# Patient Record
Sex: Male | Born: 1965 | Race: White | Hispanic: No | Marital: Single | Smoking: Never smoker
Health system: Southern US, Community
[De-identification: ages and names within clinical notes are randomized; demographics above are authoritative.]

## PROBLEM LIST (undated history)

## (undated) DIAGNOSIS — F329 Major depressive disorder, single episode, unspecified: Secondary | ICD-10-CM

## (undated) DIAGNOSIS — E559 Vitamin D deficiency, unspecified: Secondary | ICD-10-CM

## (undated) DIAGNOSIS — K439 Ventral hernia without obstruction or gangrene: Secondary | ICD-10-CM

## (undated) DIAGNOSIS — G4730 Sleep apnea, unspecified: Secondary | ICD-10-CM

## (undated) DIAGNOSIS — G8929 Other chronic pain: Secondary | ICD-10-CM

## (undated) DIAGNOSIS — B019 Varicella without complication: Secondary | ICD-10-CM

## (undated) DIAGNOSIS — F32A Depression, unspecified: Secondary | ICD-10-CM

## (undated) DIAGNOSIS — I10 Essential (primary) hypertension: Secondary | ICD-10-CM

## (undated) DIAGNOSIS — Z8614 Personal history of Methicillin resistant Staphylococcus aureus infection: Secondary | ICD-10-CM

## (undated) HISTORY — DX: Depression, unspecified: F32.A

## (undated) HISTORY — DX: Sleep apnea, unspecified: G47.30

## (undated) HISTORY — DX: Ventral hernia without obstruction or gangrene: K43.9

## (undated) HISTORY — DX: Varicella without complication: B01.9

## (undated) HISTORY — PX: ACHILLES TENDON REPAIR: SUR1153

## (undated) HISTORY — DX: Vitamin D deficiency, unspecified: E55.9

## (undated) HISTORY — DX: Major depressive disorder, single episode, unspecified: F32.9

## (undated) HISTORY — PX: UVULOPALATOPHARYNGOPLASTY: SHX827

## (undated) HISTORY — DX: Personal history of Methicillin resistant Staphylococcus aureus infection: Z86.14

---

## 2018-05-29 ENCOUNTER — Ambulatory Visit: Payer: Self-pay | Admitting: Family Medicine

## 2018-06-02 ENCOUNTER — Encounter: Payer: Self-pay | Admitting: Family Medicine

## 2018-06-02 ENCOUNTER — Ambulatory Visit: Payer: BLUE CROSS/BLUE SHIELD | Admitting: Family Medicine

## 2018-06-02 VITALS — BP 150/80 | HR 88 | Temp 97.9°F | Ht 68.0 in | Wt 245.5 lb

## 2018-06-02 DIAGNOSIS — R5383 Other fatigue: Secondary | ICD-10-CM

## 2018-06-02 DIAGNOSIS — G8929 Other chronic pain: Secondary | ICD-10-CM

## 2018-06-02 DIAGNOSIS — Z1211 Encounter for screening for malignant neoplasm of colon: Secondary | ICD-10-CM

## 2018-06-02 DIAGNOSIS — F321 Major depressive disorder, single episode, moderate: Secondary | ICD-10-CM

## 2018-06-02 DIAGNOSIS — E559 Vitamin D deficiency, unspecified: Secondary | ICD-10-CM | POA: Insufficient documentation

## 2018-06-02 DIAGNOSIS — M549 Dorsalgia, unspecified: Secondary | ICD-10-CM | POA: Diagnosis not present

## 2018-06-02 DIAGNOSIS — F339 Major depressive disorder, recurrent, unspecified: Secondary | ICD-10-CM | POA: Insufficient documentation

## 2018-06-02 DIAGNOSIS — R030 Elevated blood-pressure reading, without diagnosis of hypertension: Secondary | ICD-10-CM

## 2018-06-02 DIAGNOSIS — Z13220 Encounter for screening for lipoid disorders: Secondary | ICD-10-CM

## 2018-06-02 DIAGNOSIS — Z23 Encounter for immunization: Secondary | ICD-10-CM | POA: Diagnosis not present

## 2018-06-02 MED ORDER — BUPROPION HCL ER (XL) 150 MG PO TB24: 150.0000 mg | ORAL_TABLET | Freq: Every day | ORAL | 2 refills | Status: DC

## 2018-06-02 MED ORDER — MELOXICAM 7.5 MG PO TABS: 7.5000 mg | ORAL_TABLET | Freq: Every day | ORAL | 0 refills | Status: DC

## 2018-06-02 NOTE — Progress Notes (Signed)
Adam Mcdonald DOB: [birth year] Encounter date: 06/02/2018  This is a 52 y.o. male who presents to establish care. Chief Complaint  Patient presents with  . New Patient (Initial Visit)    pt states he has depression associated with the move, C-pap machine does not seal with his beard and he falls asleep during the day    History of present illness: Just moved from [place]. Has had a hard time with this. 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 Responsibilities doubled coming here. Likes people he is working with, but high stress.   Suffered from depression since time in [employer] 1997 was hospitalized due to depression x 5 days. Was seeing psychiatry but had hard time finding medication that worked for him (trazodone, prozac, paxil) due to ineffectiveness or side effects. At that point was on risperdal as well for some paranoia. Suffered from sleep disorder as well. Remeron helped with this but caused huge weight gain. Then started with wellbutrin which he liked. Has been pretty stable. Went to day treatment by choice and this helped him become more aware of warning signs for worsening mood. Hasn't done therapy for 5 years.   Likes to lift weights; but hasn't been going for a couple of months which makes him frustrated with self. Hasn't been motivated to do this at home or at gym (he has membership).   Has sleep apnea; mask not sealing well with beard but he really doesn't want to shave. Has sealed ok in the past with trimmed beard. Hasn't looked at report in a couple of months because afraid of score. Saw resp specialist in past who increased pressure to overcome leakage.   Has abdominal hernia. Has constipation quite often. Not having pain with hernia, just bothers him cosmetically. Would like this checked today.   Anxiety hasn't been much of an issue for him.   BP has been creeping up the last couple of years.  Takes aleve for back pain - holds stress there. Notes when he  doesn't take this.   Past Medical History:  Diagnosis Date  . Chicken pox   . Depression   . History of MRSA infection   . Sleep apnea   . Ventral hernia without obstruction or gangrene   . Vitamin D deficiency     The histories are not reviewed yet. Please review them in the "History" navigator section and refresh this [place]. Allergies  Allergen Reactions  . Other     Thimerosal (eyes only)   Current Meds  Medication Sig  . buPROPion (WELLBUTRIN XL) 300 MG 24 hr tablet Take 300 mg by mouth daily.  . carboxymethylcellulose (REFRESH PLUS) 0.5 % SOLN 1 drop 3 (three) times daily as needed.  . Cholecalciferol (VITAMIN D3) 1.25 MG (50000 UT) CAPS Take by mouth.  [name] CREATINE PO Take 5 g by mouth.  [name] MAGNESIUM GLYCINATE PLUS PO Take 120 mg by mouth 2 (two) times daily.  . Multiple Vitamin (MULTI-VITAMIN DAILY PO) Take by mouth.  . Omega 3 1200 MG CAPS Take by mouth.  . psyllium (METAMUCIL) 58.6 % powder Take 1 packet by mouth 3 (three) times daily.  . sodium chloride (MURO 128) 5 % ophthalmic solution 1 drop as needed for eye irritation.   Social History   Tobacco Use  . Smoking status: Never Smoker  . Smokeless tobacco: Never Used  Substance Use Topics  . Alcohol use: Yes    Comment: occasional   Family History  Problem Relation Age  of Onset  . Diabetes Mother   . Depression Mother   . High blood pressure Father   . Stroke Maternal Grandfather   . Heart attack Paternal Grandmother 45  . Heart attack Paternal Grandfather 50     Review of Systems  Constitutional: Negative for chills, fatigue and fever.  Respiratory: Negative for cough, chest tightness, shortness of breath and wheezing.   Cardiovascular: Negative for chest pain, palpitations and leg swelling.  Gastrointestinal: Positive for constipation (metamucil helps). Negative for abdominal pain.  Psychiatric/Behavioral: Positive for sleep disturbance. The patient is not nervous/anxious.        See hpi      Objective:  BP (!) 150/80 (BP Location: Left Arm, Patient Position: Sitting, Cuff Size: Large)   Pulse 88   Temp 97.9 F (36.6 C) (Oral)   Ht 5\' 8"  (1.727 m)   Wt 245 lb 8 oz (111.4 kg)   SpO2 97%   BMI 37.33 kg/m   Weight: 245 lb 8 oz (111.4 kg)   BP Readings from Last 3 Encounters:  06/02/18 (!) 150/80   Wt Readings from Last 3 Encounters:  06/02/18 245 lb 8 oz (111.4 kg)    Physical Exam Constitutional:      General: He is not in acute distress.    Appearance: He is well-developed.  Cardiovascular:     Rate and Rhythm: Normal rate and regular rhythm.     Heart sounds: Murmur present. Systolic murmur present with a grade of 2/6. No friction rub.     Comments: No lower extremity edema Pulmonary:     Effort: Pulmonary effort is normal. No respiratory distress.     Breath sounds: Normal breath sounds. No wheezing or rales.  Abdominal:     General: Bowel sounds are normal.     Comments: Diastasis recti apparent with abdominal strain of sitting up.   Neurological:     Mental Status: He is alert and oriented to person, place, and time.  Psychiatric:        Behavior: Behavior normal.     Assessment/Plan:  1. Depression, major, single episode, moderate (HCC) Increase wellbutrin to 450mg  daily. - buPROPion (WELLBUTRIN XL) 150 MG 24 hr tablet; Take 1 tablet (150 mg total) by mouth daily.  Dispense: 30 tablet; Refill: 2  2. Need for immunization against influenza - Flu Vaccine QUAD 36+ mos IM  3. Vitamin D deficiency - VITAMIN D 25 Hydroxy (Vit-D Deficiency, Fractures); Future  4. Chronic back pain, unspecified back location, unspecified back pain laterality - meloxicam (MOBIC) 7.5 MG tablet; Take 1 tablet (7.5 mg total) by mouth daily.  Dispense: 30 tablet; Refill: 0  5. Elevated blood pressure reading Will recheck at follow up visit. - CBC with Differential/Platelet; Future - Comprehensive metabolic panel; Future  6. Lipid screening - Lipid panel;  Future  7. Other fatigue Will check some baseline bloodwork.  - TSH; Future  8. Screening for colon cancer - Cologuard  Return 3-4 weeks for depression follow up.  Micheline Rough, MD

## 2018-06-02 NOTE — Patient Instructions (Signed)
Add on the wellbutrin 150mg  to current 300mg  dose.   Trim beard; let me know if cpap is still not sealing properly.   I will call you with bloodwork results when I get them.

## 2018-06-08 ENCOUNTER — Other Ambulatory Visit: Payer: BLUE CROSS/BLUE SHIELD

## 2018-06-13 ENCOUNTER — Other Ambulatory Visit (INDEPENDENT_AMBULATORY_CARE_PROVIDER_SITE_OTHER): Payer: BLUE CROSS/BLUE SHIELD

## 2018-06-13 DIAGNOSIS — E559 Vitamin D deficiency, unspecified: Secondary | ICD-10-CM

## 2018-06-13 DIAGNOSIS — R5383 Other fatigue: Secondary | ICD-10-CM

## 2018-06-13 DIAGNOSIS — Z13220 Encounter for screening for lipoid disorders: Secondary | ICD-10-CM | POA: Diagnosis not present

## 2018-06-13 DIAGNOSIS — R030 Elevated blood-pressure reading, without diagnosis of hypertension: Secondary | ICD-10-CM | POA: Diagnosis not present

## 2018-06-13 DIAGNOSIS — Z1211 Encounter for screening for malignant neoplasm of colon: Secondary | ICD-10-CM | POA: Diagnosis not present

## 2018-06-13 LAB — CBC WITH DIFFERENTIAL/PLATELET
Basophils Absolute: 0 10*3/uL (ref 0.0–0.1)
Basophils Relative: 0.3 % (ref 0.0–3.0)
Eosinophils Absolute: 0.1 10*3/uL (ref 0.0–0.7)
Eosinophils Relative: 1.6 % (ref 0.0–5.0)
HEMATOCRIT: 50.9 % (ref 39.0–52.0)
Hemoglobin: 17.4 g/dL — ABNORMAL HIGH (ref 13.0–17.0)
LYMPHS ABS: 2.5 10*3/uL (ref 0.7–4.0)
LYMPHS PCT: 39.2 % (ref 12.0–46.0)
MCHC: 34.1 g/dL (ref 30.0–36.0)
MCV: 91.1 fl (ref 78.0–100.0)
Monocytes Absolute: 0.5 10*3/uL (ref 0.1–1.0)
Monocytes Relative: 8.4 % (ref 3.0–12.0)
NEUTROS ABS: 3.2 10*3/uL (ref 1.4–7.7)
NEUTROS PCT: 50.5 % (ref 43.0–77.0)
Platelets: 286 10*3/uL (ref 150.0–400.0)
RBC: 5.59 Mil/uL (ref 4.22–5.81)
RDW: 13.2 % (ref 11.5–15.5)
WBC: 6.4 10*3/uL (ref 4.0–10.5)

## 2018-06-13 LAB — LIPID PANEL
Cholesterol: 163 mg/dL (ref 0–200)
HDL: 36.6 mg/dL — ABNORMAL LOW (ref >39.00)
LDL Cholesterol: 92 mg/dL (ref 0–99)
NonHDL: 125.99
Total CHOL/HDL Ratio: 4
Triglycerides: 168 mg/dL — ABNORMAL HIGH (ref 0.0–149.0)
VLDL: 33.6 mg/dL (ref 0.0–40.0)

## 2018-06-13 LAB — COMPREHENSIVE METABOLIC PANEL
ALT: 60 U/L — ABNORMAL HIGH (ref 0–53)
AST: 27 U/L (ref 0–37)
Albumin: 4.8 g/dL (ref 3.5–5.2)
Alkaline Phosphatase: 55 U/L (ref 39–117)
BUN: 13 mg/dL (ref 6–23)
CALCIUM: 9.7 mg/dL (ref 8.4–10.5)
CO2: 30 mEq/L (ref 19–32)
CREATININE: 1.06 mg/dL (ref 0.40–1.50)
Chloride: 101 mEq/L (ref 96–112)
GFR: 78.42 mL/min (ref >60.00)
Glucose, Bld: 83 mg/dL (ref 70–99)
Potassium: 4.7 mEq/L (ref 3.5–5.1)
Sodium: 137 mEq/L (ref 135–145)
Total Bilirubin: 0.7 mg/dL (ref 0.2–1.2)
Total Protein: 7.4 g/dL (ref 6.0–8.3)

## 2018-06-13 LAB — VITAMIN D 25 HYDROXY (VIT D DEFICIENCY, FRACTURES): VITD: 50.83 ng/mL (ref 30.00–100.00)

## 2018-06-13 LAB — TSH: TSH: 2.21 u[IU]/mL (ref 0.35–4.50)

## 2018-06-20 LAB — COLOGUARD
Cologuard: NEGATIVE
Cologuard: NEGATIVE

## 2018-06-21 ENCOUNTER — Encounter: Payer: Self-pay | Admitting: Family Medicine

## 2018-06-23 ENCOUNTER — Ambulatory Visit: Payer: BLUE CROSS/BLUE SHIELD | Admitting: Family Medicine

## 2018-06-23 ENCOUNTER — Encounter: Payer: Self-pay | Admitting: Family Medicine

## 2018-06-23 VITALS — BP 122/70 | HR 70 | Temp 97.9°F | Wt 241.9 lb

## 2018-06-23 DIAGNOSIS — F321 Major depressive disorder, single episode, moderate: Secondary | ICD-10-CM

## 2018-06-23 DIAGNOSIS — M549 Dorsalgia, unspecified: Secondary | ICD-10-CM

## 2018-06-23 DIAGNOSIS — G8929 Other chronic pain: Secondary | ICD-10-CM

## 2018-06-23 DIAGNOSIS — R748 Abnormal levels of other serum enzymes: Secondary | ICD-10-CM | POA: Diagnosis not present

## 2018-06-23 DIAGNOSIS — M7662 Achilles tendinitis, left leg: Secondary | ICD-10-CM

## 2018-06-23 DIAGNOSIS — M545 Low back pain, unspecified: Secondary | ICD-10-CM | POA: Insufficient documentation

## 2018-06-23 MED ORDER — BUPROPION HCL ER (XL) 150 MG PO TB24: 150.0000 mg | ORAL_TABLET | Freq: Every day | ORAL | 1 refills | Status: DC

## 2018-06-23 MED ORDER — MELOXICAM 7.5 MG PO TABS: 7.5000 mg | ORAL_TABLET | Freq: Every day | ORAL | 1 refills | Status: DC

## 2018-06-23 MED ORDER — BUPROPION HCL ER (XL) 300 MG PO TB24: 300.0000 mg | ORAL_TABLET | Freq: Every day | ORAL | 1 refills | Status: DC

## 2018-06-23 NOTE — Progress Notes (Signed)
Adam Mcdonald DOB: 1966/03/02 Encounter date: 06/23/2018  This is a 53 y.o. male who presents with Chief Complaint  Patient presents with  . Follow-up    History of present illness: At last visit 06/02/18: Increased wellbutrin to 450mg  at last visit.   Depression has been "really good". Thinks just felt better after getting established and having increase in wellbutrin.   First few days was taking both medications in morning and got some headache, nausea. Then moved the meloxicam to bedtime and did much better. Pain did much better in this last week. Started back at gym, using foam roller, but was able to decrease pain level from 6-7 down to 1-3 level. States that pain is all over. Lower-mid back is what bothers him most. If standing too long then lower back bothering him more. Also some nagging pain around elbow; worse with flexion. Also having pain in left achilles tendon. Injured back in October. Just feels like it isn't healing. Limping at work. Has been trying stretches for this. When flaring rates as 6-7. Has even changed pace.   Has struggled with trying to do one meal daily. Has been doing old fashioned oatmeal for breakfast which keeps him a little more full.   Mask fit has improved from mid 70 % to 97%.   Back has been issue for him historically (started aleve 6-8 years ago). Has had back pain since [employer] years ago. Bothered him to stand on feet long periods. Acupuncture was what worked best for him. Even did some PT to work on strength/support. Had injury around mid back in high school and stress seems to aggravate this.     Allergies  Allergen Reactions  . Other     Thimerosal (eyes only)   Current Meds  Medication Sig  . buPROPion (WELLBUTRIN XL) 150 MG 24 hr tablet Take 1 tablet (150 mg total) by mouth daily.  [name] buPROPion (WELLBUTRIN XL) 300 MG 24 hr tablet Take 1 tablet (300 mg total) by mouth daily.  . carboxymethylcellulose (REFRESH PLUS) 0.5 % SOLN 1 drop 3 (three)  times daily as needed.  . Cholecalciferol (VITAMIN D3) 1.25 MG (50000 UT) CAPS Take by mouth.  [name] CREATINE PO Take 5 g by mouth.  [name] MAGNESIUM GLYCINATE PLUS PO Take 120 mg by mouth 2 (two) times daily.  . meloxicam (MOBIC) 7.5 MG tablet Take 1 tablet (7.5 mg total) by mouth daily.  . Multiple Vitamin (MULTI-VITAMIN DAILY PO) Take by mouth.  . Omega 3 1200 MG CAPS Take by mouth.  . psyllium (METAMUCIL) 58.6 % powder Take 1 packet by mouth 3 (three) times daily.  . sodium chloride (MURO 128) 5 % ophthalmic solution 1 drop as needed for eye irritation.  . [DISCONTINUED] buPROPion (WELLBUTRIN XL) 150 MG 24 hr tablet Take 1 tablet (150 mg total) by mouth daily.  . [DISCONTINUED] buPROPion (WELLBUTRIN XL) 300 MG 24 hr tablet Take 300 mg by mouth daily.  . [DISCONTINUED] meloxicam (MOBIC) 7.5 MG tablet Take 1 tablet (7.5 mg total) by mouth daily.    Review of Systems  Constitutional: Negative for chills, fatigue and fever.  Respiratory: Negative for cough, chest tightness, shortness of breath and wheezing.   Cardiovascular: Negative for chest pain, palpitations and leg swelling.  Psychiatric/Behavioral: The patient is not nervous/anxious.        Sleep is a little better. Still some fatigue in morning but improving.     Objective:  BP 122/70 (BP Location: Left Arm, Patient Position: Sitting, Cuff  Size: Normal)   Pulse 70   Temp 97.9 F (36.6 C) (Oral)   Wt 241 lb 14.4 oz (109.7 kg)   SpO2 98%   BMI 36.78 kg/m   Weight: 241 lb 14.4 oz (109.7 kg)   BP Readings from Last 3 Encounters:  06/23/18 122/70  06/02/18 (!) 150/80   Wt Readings from Last 3 Encounters:  06/23/18 241 lb 14.4 oz (109.7 kg)  06/02/18 245 lb 8 oz (111.4 kg)    Physical Exam Constitutional:      General: He is not in acute distress.    Appearance: He is well-developed.  Cardiovascular:     Rate and Rhythm: Normal rate and regular rhythm.     Heart sounds: Normal heart sounds. No murmur. No friction rub.   Pulmonary:     Effort: Pulmonary effort is normal. No respiratory distress.     Breath sounds: Normal breath sounds. No wheezing or rales.  Abdominal:     General: Abdomen is flat. Bowel sounds are normal. There is no distension.     Palpations: Abdomen is soft. There is no hepatomegaly or splenomegaly.     Tenderness: There is no abdominal tenderness.  Musculoskeletal:     Right lower leg: No edema.     Left lower leg: No edema.  Neurological:     Mental Status: He is alert and oriented to person, place, and time.  Psychiatric:        Behavior: Behavior normal.     Assessment/Plan 1. Tendonitis, Achilles, left - Ambulatory referral to Podiatry  2. Elevated liver enzymes Subtle; recheck 3 mo - Hepatic function panel; Future  3. Depression, major, single episode, moderate (HCC) Stable and improved, continue current wellbutrin dose - buPROPion (WELLBUTRIN XL) 150 MG 24 hr tablet; Take 1 tablet (150 mg total) by mouth daily.  Dispense: 90 tablet; Refill: 1 - buPROPion (WELLBUTRIN XL) 300 MG 24 hr tablet; Take 1 tablet (300 mg total) by mouth daily.  Dispense: 90 tablet; Refill: 1  4. Chronic back pain, unspecified back location, unspecified back pain laterality Monitor; let me know if any worsening. Stable. - meloxicam (MOBIC) 7.5 MG tablet; Take 1 tablet (7.5 mg total) by mouth daily.  Dispense: 90 tablet; Refill: 1  Return in about 3 months (around 09/22/2018) for physical exam.       Micheline Rough, MD

## 2018-06-27 ENCOUNTER — Telehealth: Payer: Self-pay

## 2018-06-27 NOTE — Telephone Encounter (Signed)
Marya Amsler, RN, called the patient yesterday on Monday January, 20th.  Left message for patient to call back.

## 2018-06-27 NOTE — Telephone Encounter (Signed)
Pt called back to get results.  Per [facility] not able to release.  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  Wants to know if a message can be left at either phone number or e-mail can be sent to [email or web address]

## 2018-06-27 NOTE — Telephone Encounter (Signed)
Cologuard results are negative.

## 2018-06-27 NOTE — Telephone Encounter (Signed)
Left a detailed message on verified voice mail.   

## 2018-06-29 ENCOUNTER — Ambulatory Visit (INDEPENDENT_AMBULATORY_CARE_PROVIDER_SITE_OTHER): Payer: BLUE CROSS/BLUE SHIELD

## 2018-06-29 ENCOUNTER — Encounter: Payer: Self-pay | Admitting: Podiatry

## 2018-06-29 ENCOUNTER — Ambulatory Visit: Payer: BLUE CROSS/BLUE SHIELD | Admitting: Podiatry

## 2018-06-29 VITALS — BP 142/89 | HR 83 | Resp 16

## 2018-06-29 DIAGNOSIS — M7662 Achilles tendinitis, left leg: Secondary | ICD-10-CM

## 2018-06-29 MED ORDER — METHYLPREDNISOLONE 4 MG PO TBPK: ORAL_TABLET | ORAL | 0 refills | Status: DC

## 2018-06-29 NOTE — Progress Notes (Signed)
Subjective:  Patient ID: Adam Mcdonald, male    DOB: 1966-03-08,  MRN: [id number] HPI Chief Complaint  Patient presents with  . Foot Pain    Posterior heel left - aching x 3 months, no specific injury, but did notice pain after light jog that never went away, AM pain, takes pain meds for back occasionally but hasn't helped foot,   . New Patient (Initial Visit)    53 y.o. male presents with the above complaint.   ROS: He denies fever chills nausea vomiting muscle aches pains calf pain back pain chest pain shortness of breath.  Past Medical History:  Diagnosis Date  . Chicken pox   . Depression   . History of MRSA infection   . Sleep apnea   . Ventral hernia without obstruction or gangrene   . Vitamin D deficiency    Past Surgical History:  Procedure Laterality Date  . UVULOPALATOPHARYNGOPLASTY      Current Outpatient Medications:  .  buPROPion (WELLBUTRIN XL) 150 MG 24 hr tablet, Take 1 tablet (150 mg total) by mouth daily., Disp: 90 tablet, Rfl: 1 .  buPROPion (WELLBUTRIN XL) 300 MG 24 hr tablet, Take 1 tablet (300 mg total) by mouth daily., Disp: 90 tablet, Rfl: 1 .  Cholecalciferol (VITAMIN D3) 1.25 MG (50000 UT) CAPS, Take by mouth., Disp: , Rfl:  .  CREATINE PO, Take 5 g by mouth., Disp: , Rfl:  .  MAGNESIUM GLYCINATE PLUS PO, Take 120 mg by mouth 2 (two) times daily., Disp: , Rfl:  .  meloxicam (MOBIC) 7.5 MG tablet, Take 1 tablet (7.5 mg total) by mouth daily., Disp: 90 tablet, Rfl: 1 .  methylPREDNISolone (MEDROL DOSEPAK) 4 MG TBPK tablet, 6 day dose pack - take as directed, Disp: 21 tablet, Rfl: 0 .  Multiple Vitamin (MULTI-VITAMIN DAILY PO), Take by mouth., Disp: , Rfl:  .  Omega 3 1200 MG CAPS, Take by mouth., Disp: , Rfl:   Allergies  Allergen Reactions  . Other     Thimerosal (eyes only)   Review of Systems Objective:   Vitals:   06/29/18 1633  BP: (!) 142/89  Pulse: 83  Resp: 16    General: Well developed, nourished, in no acute distress, alert and  oriented x3   Dermatological: Skin is warm, dry and supple bilateral. Nails x 10 are well maintained; remaining integument appears unremarkable at this time. There are no open sores, no preulcerative lesions, no rash or signs of infection present.  Vascular: Dorsalis Pedis artery and Posterior Tibial artery pedal pulses are 2/4 bilateral with immedate capillary fill time. Pedal hair growth present. No varicosities and no lower extremity edema present bilateral.   Neruologic: Grossly intact via light touch bilateral. Vibratory intact via tuning fork bilateral. Protective threshold with Semmes Wienstein monofilament intact to all pedal sites bilateral. Patellar and Achilles deep tendon reflexes 2+ bilateral. No Babinski or clonus noted bilateral.   Musculoskeletal: No gross boney pedal deformities bilateral. No pain, crepitus, or limitation noted with foot and ankle range of motion bilateral. Muscular strength 5/5 in all groups tested bilateral.  Pain on palpation the distal aspect of the Achilles at its insertion site left.  Exquisitely tender.  Pes planus flexible in nature.  Gait: Unassisted, Nonantalgic.    Radiographs:  Radiographs taken today demonstrate pes planus with retrocalcaneal heel spur and thickening of the Achilles tendon as it inserts near the spur.  No acute findings noted.  Assessment & Plan:   Assessment: Insertional Achilles tendinitis  with pes planus right.  Plan: Discussed etiology pathology and surgical therapies at this point I injected after sterile Betadine skin prep 2 mg of Kenalog and local anesthetic to the point of maximal tenderness.  Placed him in a cam walker and wrote a prescription for Medrol Dosepak to be followed by multiple meloxicam which she already has.  I will follow-up with him in 1 month if he is not improved an MRI will be necessary.  If he is improved considerably and we can consider physical therapy and orthotics for him.     Max T. Reid Hope King, [place]

## 2018-06-30 ENCOUNTER — Encounter: Payer: Self-pay | Admitting: Podiatry

## 2018-07-13 ENCOUNTER — Encounter: Payer: Self-pay | Admitting: Podiatry

## 2018-07-13 ENCOUNTER — Ambulatory Visit: Payer: BLUE CROSS/BLUE SHIELD | Admitting: Podiatry

## 2018-07-13 DIAGNOSIS — M7662 Achilles tendinitis, left leg: Secondary | ICD-10-CM | POA: Diagnosis not present

## 2018-07-13 MED ORDER — METHYLPREDNISOLONE 4 MG PO TBPK: ORAL_TABLET | ORAL | 0 refills | Status: DC

## 2018-07-13 NOTE — Progress Notes (Signed)
He presents today for follow-up of his Achilles tendinitis he states that he feels like he is needing some restrictions at work he felt it states that initially his foot was feeling much better but has now since become more painful even wearing the cam walker while at work.  He states that I feel like he just may be doing too much and I do not want to have to go to surgery.  Objective: Vital signs are stable alert and oriented x3.  Pulses are palpable.  He has pain on palpation of the Achilles at the dorsal aspect of the calcaneus.  Assessment: Continued Achilles tendinitis though it seems to be improving.  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  I will follow-up with him in 1 month for reevaluation if not improved MRI.

## 2018-07-27 ENCOUNTER — Ambulatory Visit: Payer: BLUE CROSS/BLUE SHIELD | Admitting: Podiatry

## 2018-08-10 ENCOUNTER — Encounter: Payer: Self-pay | Admitting: Podiatry

## 2018-08-10 ENCOUNTER — Ambulatory Visit: Payer: BLUE CROSS/BLUE SHIELD | Admitting: Podiatry

## 2018-08-10 DIAGNOSIS — M7662 Achilles tendinitis, left leg: Secondary | ICD-10-CM

## 2018-08-10 NOTE — Progress Notes (Signed)
He presents today for follow-up of his Achilles tendinitis of his left foot states that is about 90% improved but there is 1 spot that is really sensitive on the posterior aspect of the heel.  Objective: Vital signs are stable he is alert and oriented x3.  Pulses are palpable.  He has 1 spot on the very posterior aspect of the heel centrally located that is tender to the touch.  Much better than previously noted.  Is no longer warm to the touch.  Assessment: Achilles tendinitis with bursitis.  Plan: At this point I went ahead and injected the area today with 2 mg of dexamethasone local anesthetic to the point of maximal tenderness.  Regarding get him into physical therapy should this not alleviate his symptoms and MRI will be necessary.

## 2018-08-11 ENCOUNTER — Telehealth: Payer: Self-pay | Admitting: *Deleted

## 2018-08-11 DIAGNOSIS — M7662 Achilles tendinitis, left leg: Secondary | ICD-10-CM

## 2018-08-11 NOTE — Telephone Encounter (Signed)
-----   Message from Rip Harbour, [facility] sent at 08/10/2018  5:52 PM EST ----- Regarding: PT PT referral - [place]  Evaluate and treat achilles tendonitis left   Duration: 3 x week x 4 weeks

## 2018-08-11 NOTE — Telephone Encounter (Signed)
Faxed required form, and demographics to [place].

## 2018-08-17 DIAGNOSIS — M7662 Achilles tendinitis, left leg: Secondary | ICD-10-CM | POA: Diagnosis not present

## 2018-08-17 DIAGNOSIS — M6281 Muscle weakness (generalized): Secondary | ICD-10-CM | POA: Diagnosis not present

## 2018-08-17 DIAGNOSIS — M25672 Stiffness of left ankle, not elsewhere classified: Secondary | ICD-10-CM | POA: Diagnosis not present

## 2018-08-21 DIAGNOSIS — M25672 Stiffness of left ankle, not elsewhere classified: Secondary | ICD-10-CM | POA: Diagnosis not present

## 2018-08-21 DIAGNOSIS — M7662 Achilles tendinitis, left leg: Secondary | ICD-10-CM | POA: Diagnosis not present

## 2018-08-21 DIAGNOSIS — M6281 Muscle weakness (generalized): Secondary | ICD-10-CM | POA: Diagnosis not present

## 2018-08-22 DIAGNOSIS — M6281 Muscle weakness (generalized): Secondary | ICD-10-CM | POA: Diagnosis not present

## 2018-08-22 DIAGNOSIS — M25672 Stiffness of left ankle, not elsewhere classified: Secondary | ICD-10-CM | POA: Diagnosis not present

## 2018-08-22 DIAGNOSIS — M7662 Achilles tendinitis, left leg: Secondary | ICD-10-CM | POA: Diagnosis not present

## 2018-08-24 DIAGNOSIS — M7662 Achilles tendinitis, left leg: Secondary | ICD-10-CM | POA: Diagnosis not present

## 2018-08-24 DIAGNOSIS — M25672 Stiffness of left ankle, not elsewhere classified: Secondary | ICD-10-CM | POA: Diagnosis not present

## 2018-08-24 DIAGNOSIS — M6281 Muscle weakness (generalized): Secondary | ICD-10-CM | POA: Diagnosis not present

## 2018-08-28 DIAGNOSIS — M25672 Stiffness of left ankle, not elsewhere classified: Secondary | ICD-10-CM | POA: Diagnosis not present

## 2018-08-28 DIAGNOSIS — M6281 Muscle weakness (generalized): Secondary | ICD-10-CM | POA: Diagnosis not present

## 2018-08-28 DIAGNOSIS — M7662 Achilles tendinitis, left leg: Secondary | ICD-10-CM | POA: Diagnosis not present

## 2018-08-30 DIAGNOSIS — M6281 Muscle weakness (generalized): Secondary | ICD-10-CM | POA: Diagnosis not present

## 2018-08-30 DIAGNOSIS — M25672 Stiffness of left ankle, not elsewhere classified: Secondary | ICD-10-CM | POA: Diagnosis not present

## 2018-08-30 DIAGNOSIS — M7662 Achilles tendinitis, left leg: Secondary | ICD-10-CM | POA: Diagnosis not present

## 2018-09-05 DIAGNOSIS — M25672 Stiffness of left ankle, not elsewhere classified: Secondary | ICD-10-CM | POA: Diagnosis not present

## 2018-09-05 DIAGNOSIS — M7662 Achilles tendinitis, left leg: Secondary | ICD-10-CM | POA: Diagnosis not present

## 2018-09-05 DIAGNOSIS — M6281 Muscle weakness (generalized): Secondary | ICD-10-CM | POA: Diagnosis not present

## 2018-09-06 DIAGNOSIS — M6281 Muscle weakness (generalized): Secondary | ICD-10-CM | POA: Diagnosis not present

## 2018-09-06 DIAGNOSIS — M25672 Stiffness of left ankle, not elsewhere classified: Secondary | ICD-10-CM | POA: Diagnosis not present

## 2018-09-06 DIAGNOSIS — M7662 Achilles tendinitis, left leg: Secondary | ICD-10-CM | POA: Diagnosis not present

## 2018-09-08 DIAGNOSIS — M7662 Achilles tendinitis, left leg: Secondary | ICD-10-CM | POA: Diagnosis not present

## 2018-09-08 DIAGNOSIS — M25672 Stiffness of left ankle, not elsewhere classified: Secondary | ICD-10-CM | POA: Diagnosis not present

## 2018-09-08 DIAGNOSIS — M6281 Muscle weakness (generalized): Secondary | ICD-10-CM | POA: Diagnosis not present

## 2018-09-11 DIAGNOSIS — M7662 Achilles tendinitis, left leg: Secondary | ICD-10-CM | POA: Diagnosis not present

## 2018-09-11 DIAGNOSIS — M25672 Stiffness of left ankle, not elsewhere classified: Secondary | ICD-10-CM | POA: Diagnosis not present

## 2018-09-11 DIAGNOSIS — M6281 Muscle weakness (generalized): Secondary | ICD-10-CM | POA: Diagnosis not present

## 2018-09-13 DIAGNOSIS — M25672 Stiffness of left ankle, not elsewhere classified: Secondary | ICD-10-CM | POA: Diagnosis not present

## 2018-09-13 DIAGNOSIS — M6281 Muscle weakness (generalized): Secondary | ICD-10-CM | POA: Diagnosis not present

## 2018-09-13 DIAGNOSIS — M7662 Achilles tendinitis, left leg: Secondary | ICD-10-CM | POA: Diagnosis not present

## 2018-09-14 DIAGNOSIS — M7662 Achilles tendinitis, left leg: Secondary | ICD-10-CM | POA: Diagnosis not present

## 2018-09-14 DIAGNOSIS — M25672 Stiffness of left ankle, not elsewhere classified: Secondary | ICD-10-CM | POA: Diagnosis not present

## 2018-09-14 DIAGNOSIS — M6281 Muscle weakness (generalized): Secondary | ICD-10-CM | POA: Diagnosis not present

## 2018-09-19 ENCOUNTER — Encounter: Payer: Self-pay | Admitting: Podiatry

## 2018-09-20 NOTE — Telephone Encounter (Signed)
[facility] - Baruch Merl, PT left message requesting continuation of PT, but pt's name was unclear. Unfortunately pt left message requesting continuation of PT on MyChart, I forwarded to Dr. Milinda Pointer and Dr. Milinda Pointer approved. I left message on [facility] for Mark, PT to continue pt's therapy for 3x week for 4 weeks.

## 2018-09-21 DIAGNOSIS — M25672 Stiffness of left ankle, not elsewhere classified: Secondary | ICD-10-CM | POA: Diagnosis not present

## 2018-09-21 DIAGNOSIS — M6281 Muscle weakness (generalized): Secondary | ICD-10-CM | POA: Diagnosis not present

## 2018-09-21 DIAGNOSIS — M7662 Achilles tendinitis, left leg: Secondary | ICD-10-CM | POA: Diagnosis not present

## 2018-09-22 ENCOUNTER — Encounter: Payer: BLUE CROSS/BLUE SHIELD | Admitting: Family Medicine

## 2018-09-25 DIAGNOSIS — M6281 Muscle weakness (generalized): Secondary | ICD-10-CM | POA: Diagnosis not present

## 2018-09-25 DIAGNOSIS — M25672 Stiffness of left ankle, not elsewhere classified: Secondary | ICD-10-CM | POA: Diagnosis not present

## 2018-09-25 DIAGNOSIS — M7662 Achilles tendinitis, left leg: Secondary | ICD-10-CM | POA: Diagnosis not present

## 2018-09-26 DIAGNOSIS — M25672 Stiffness of left ankle, not elsewhere classified: Secondary | ICD-10-CM | POA: Diagnosis not present

## 2018-09-26 DIAGNOSIS — M7662 Achilles tendinitis, left leg: Secondary | ICD-10-CM | POA: Diagnosis not present

## 2018-09-26 DIAGNOSIS — M6281 Muscle weakness (generalized): Secondary | ICD-10-CM | POA: Diagnosis not present

## 2018-09-28 DIAGNOSIS — M6281 Muscle weakness (generalized): Secondary | ICD-10-CM | POA: Diagnosis not present

## 2018-09-28 DIAGNOSIS — M25672 Stiffness of left ankle, not elsewhere classified: Secondary | ICD-10-CM | POA: Diagnosis not present

## 2018-09-28 DIAGNOSIS — M7662 Achilles tendinitis, left leg: Secondary | ICD-10-CM | POA: Diagnosis not present

## 2018-10-02 DIAGNOSIS — M25672 Stiffness of left ankle, not elsewhere classified: Secondary | ICD-10-CM | POA: Diagnosis not present

## 2018-10-02 DIAGNOSIS — M6281 Muscle weakness (generalized): Secondary | ICD-10-CM | POA: Diagnosis not present

## 2018-10-02 DIAGNOSIS — M7662 Achilles tendinitis, left leg: Secondary | ICD-10-CM | POA: Diagnosis not present

## 2018-10-03 DIAGNOSIS — M25672 Stiffness of left ankle, not elsewhere classified: Secondary | ICD-10-CM | POA: Diagnosis not present

## 2018-10-03 DIAGNOSIS — M6281 Muscle weakness (generalized): Secondary | ICD-10-CM | POA: Diagnosis not present

## 2018-10-03 DIAGNOSIS — M7662 Achilles tendinitis, left leg: Secondary | ICD-10-CM | POA: Diagnosis not present

## 2018-10-05 DIAGNOSIS — M6281 Muscle weakness (generalized): Secondary | ICD-10-CM | POA: Diagnosis not present

## 2018-10-05 DIAGNOSIS — M7662 Achilles tendinitis, left leg: Secondary | ICD-10-CM | POA: Diagnosis not present

## 2018-10-05 DIAGNOSIS — M25672 Stiffness of left ankle, not elsewhere classified: Secondary | ICD-10-CM | POA: Diagnosis not present

## 2018-10-09 DIAGNOSIS — M6281 Muscle weakness (generalized): Secondary | ICD-10-CM | POA: Diagnosis not present

## 2018-10-09 DIAGNOSIS — M25672 Stiffness of left ankle, not elsewhere classified: Secondary | ICD-10-CM | POA: Diagnosis not present

## 2018-10-09 DIAGNOSIS — M7662 Achilles tendinitis, left leg: Secondary | ICD-10-CM | POA: Diagnosis not present

## 2018-10-10 DIAGNOSIS — M7662 Achilles tendinitis, left leg: Secondary | ICD-10-CM | POA: Diagnosis not present

## 2018-10-10 DIAGNOSIS — M6281 Muscle weakness (generalized): Secondary | ICD-10-CM | POA: Diagnosis not present

## 2018-10-10 DIAGNOSIS — M25672 Stiffness of left ankle, not elsewhere classified: Secondary | ICD-10-CM | POA: Diagnosis not present

## 2018-10-16 DIAGNOSIS — M25672 Stiffness of left ankle, not elsewhere classified: Secondary | ICD-10-CM | POA: Diagnosis not present

## 2018-10-16 DIAGNOSIS — M6281 Muscle weakness (generalized): Secondary | ICD-10-CM | POA: Diagnosis not present

## 2018-10-16 DIAGNOSIS — M7662 Achilles tendinitis, left leg: Secondary | ICD-10-CM | POA: Diagnosis not present

## 2018-10-17 DIAGNOSIS — M7662 Achilles tendinitis, left leg: Secondary | ICD-10-CM | POA: Diagnosis not present

## 2018-10-17 DIAGNOSIS — M25672 Stiffness of left ankle, not elsewhere classified: Secondary | ICD-10-CM | POA: Diagnosis not present

## 2018-10-17 DIAGNOSIS — M6281 Muscle weakness (generalized): Secondary | ICD-10-CM | POA: Diagnosis not present

## 2018-10-19 DIAGNOSIS — M7662 Achilles tendinitis, left leg: Secondary | ICD-10-CM | POA: Diagnosis not present

## 2018-10-19 DIAGNOSIS — M25672 Stiffness of left ankle, not elsewhere classified: Secondary | ICD-10-CM | POA: Diagnosis not present

## 2018-10-19 DIAGNOSIS — M6281 Muscle weakness (generalized): Secondary | ICD-10-CM | POA: Diagnosis not present

## 2018-12-06 ENCOUNTER — Other Ambulatory Visit: Payer: Self-pay | Admitting: Family Medicine

## 2018-12-06 DIAGNOSIS — G8929 Other chronic pain: Secondary | ICD-10-CM

## 2018-12-06 DIAGNOSIS — M549 Dorsalgia, unspecified: Secondary | ICD-10-CM

## 2018-12-06 DIAGNOSIS — F321 Major depressive disorder, single episode, moderate: Secondary | ICD-10-CM

## 2018-12-07 ENCOUNTER — Ambulatory Visit: Payer: BLUE CROSS/BLUE SHIELD | Admitting: Podiatry

## 2018-12-07 ENCOUNTER — Other Ambulatory Visit: Payer: Self-pay

## 2018-12-07 ENCOUNTER — Encounter

## 2018-12-07 ENCOUNTER — Encounter: Payer: Self-pay | Admitting: Podiatry

## 2018-12-07 VITALS — Temp 98.3°F

## 2018-12-07 DIAGNOSIS — M7662 Achilles tendinitis, left leg: Secondary | ICD-10-CM | POA: Diagnosis not present

## 2018-12-07 DIAGNOSIS — S86012A Strain of left Achilles tendon, initial encounter: Secondary | ICD-10-CM | POA: Diagnosis not present

## 2018-12-07 NOTE — Progress Notes (Signed)
He presents today for follow-up of his Achilles tendinitis he states is about 90% better however he just cannot seem to get over that 10% he still states that is still exquisitely tender by history points to his left heel.  Objective: Vital signs are stable he is alert and oriented x3 severe pain on palpation Achilles at its insertion site of the posterior superior aspect of the calcaneus.  Mild erythema mild warmth on palpation.  Assessment: Probable split tear of the Achilles tendon at its insertion site superior heel.  Plan: At this point I will request an MRI for surgical consideration.

## 2018-12-12 NOTE — Addendum Note (Signed)
Addended by: Rip Harbour on: 12/12/2018 08:09 AM   Modules accepted: Orders

## 2018-12-16 ENCOUNTER — Other Ambulatory Visit: Payer: Self-pay | Admitting: Family Medicine

## 2018-12-16 DIAGNOSIS — F321 Major depressive disorder, single episode, moderate: Secondary | ICD-10-CM

## 2018-12-19 ENCOUNTER — Other Ambulatory Visit: Payer: Self-pay

## 2018-12-19 ENCOUNTER — Ambulatory Visit
Admission: RE | Admit: 2018-12-19 | Discharge: 2018-12-19 | Disposition: A | Discharge status: Home / Self Care | Payer: BC Managed Care – PPO | Source: Ambulatory Visit | Attending: Podiatry | Admitting: Podiatry

## 2018-12-19 DIAGNOSIS — M19072 Primary osteoarthritis, left ankle and foot: Secondary | ICD-10-CM | POA: Diagnosis not present

## 2018-12-19 DIAGNOSIS — S86012A Strain of left Achilles tendon, initial encounter: Secondary | ICD-10-CM

## 2018-12-19 DIAGNOSIS — M7662 Achilles tendinitis, left leg: Secondary | ICD-10-CM

## 2018-12-20 ENCOUNTER — Telehealth: Payer: Self-pay | Admitting: *Deleted

## 2018-12-20 NOTE — Telephone Encounter (Signed)
Left message informing pt Dr. Milinda Pointer requested to send a copy of the MRI disc to a radiology specialist for more details for treatment planning, this would delay final results 10-14 days and once received we would call with instructions.

## 2018-12-20 NOTE — Telephone Encounter (Signed)
Mailed copy of MRI disc to [facility]. 

## 2018-12-20 NOTE — Telephone Encounter (Signed)
Pt called and I informed of Dr. Stephenie Acres request. Pt asked if Dr. Milinda Pointer would give a preliminary.

## 2018-12-20 NOTE — Telephone Encounter (Signed)
-----   Message from Garrel Ridgel, [place] sent at 12/20/2018  7:36 AM EDT ----- Please send this for an over read and inform the patient of the delay.

## 2018-12-20 NOTE — Telephone Encounter (Signed)
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

## 2018-12-20 NOTE — Telephone Encounter (Signed)
Left message informing pt Dr. Milinda Pointer had called with the explanation of the MRI results.

## 2018-12-21 NOTE — Telephone Encounter (Signed)
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 Left message informing pt of Dr. Stephenie Acres review of the MRI results.

## 2018-12-27 ENCOUNTER — Encounter: Payer: BC Managed Care – PPO | Admitting: Family Medicine

## 2019-01-04 ENCOUNTER — Encounter: Payer: Self-pay | Admitting: Podiatry

## 2019-01-16 ENCOUNTER — Ambulatory Visit: Payer: BC Managed Care – PPO | Admitting: Podiatry

## 2019-01-16 ENCOUNTER — Encounter: Payer: Self-pay | Admitting: Podiatry

## 2019-01-16 ENCOUNTER — Other Ambulatory Visit: Payer: Self-pay

## 2019-01-16 VITALS — Temp 97.3°F

## 2019-01-16 DIAGNOSIS — S86012D Strain of left Achilles tendon, subsequent encounter: Secondary | ICD-10-CM | POA: Diagnosis not present

## 2019-01-16 DIAGNOSIS — M7662 Achilles tendinitis, left leg: Secondary | ICD-10-CM

## 2019-01-16 NOTE — Patient Instructions (Signed)
Pre-Operative Instructions  Congratulations, you have decided to take an important step towards improving your quality of life.  You can be assured that the doctors and staff at [facility] will be with you every step of the way.  Here are some important things you should know:  1. Plan to be at the surgery center/hospital at least 1 (one) hour prior to your scheduled time, unless otherwise directed by the surgical center/hospital staff.  You must have a responsible adult accompany you, remain during the surgery and drive you home.  Make sure you have directions to the surgical center/hospital to ensure you arrive on time. 2. If you are having surgery at [facility] or [facility], you will need a copy of your medical history and physical form from your family physician within one month prior to the date of surgery. We will give you a form for your primary physician to complete.  3. We make every effort to accommodate the date you request for surgery.  However, there are times where surgery dates or times have to be moved.  We will contact you as soon as possible if a change in schedule is required.   4. No aspirin/ibuprofen for one week before surgery.  If you are on aspirin, any non-steroidal anti-inflammatory medications (Mobic, Aleve, Ibuprofen) should not be taken seven (7) days prior to your surgery.  You make take Tylenol for pain prior to surgery.  5. Medications - If you are taking daily heart and blood pressure medications, seizure, reflux, allergy, asthma, anxiety, pain or diabetes medications, make sure you notify the surgery center/hospital before the day of surgery so they can tell you which medications you should take or avoid the day of surgery. 6. No food or drink after midnight the night before surgery unless directed otherwise by surgical center/hospital staff. 7. No alcoholic beverages 24-hours prior to surgery.  No smoking 24-hours prior or 24-hours after  surgery. 8. Wear loose pants or shorts. They should be loose enough to fit over bandages, boots, and casts. 9. Don't wear slip-on shoes. Sneakers are preferred. 10. Bring your boot with you to the surgery center/hospital.  Also bring crutches or a walker if your physician has prescribed it for you.  If you do not have this equipment, it will be provided for you after surgery. 11. If you have not been contacted by the surgery center/hospital by the day before your surgery, call to confirm the date and time of your surgery. 12. Leave-time from work may vary depending on the type of surgery you have.  Appropriate arrangements should be made prior to surgery with your employer. 13. Prescriptions will be provided immediately following surgery by your doctor.  Fill these as soon as possible after surgery and take the medication as directed. Pain medications will not be refilled on weekends and must be approved by the doctor. 14. Remove nail polish on the operative foot and avoid getting pedicures prior to surgery. 15. Wash the night before surgery.  The night before surgery wash the foot and leg well with water and the antibacterial soap provided. Be sure to pay special attention to beneath the toenails and in between the toes.  Wash for at least three (3) minutes. Rinse thoroughly with water and dry well with a towel.  Perform this wash unless told not to do so by your physician.  Enclosed: 1 Ice pack (please put in freezer the night before surgery)   1 Hibiclens skin cleaner     Pre-op instructions  If you have any questions regarding the instructions, please do not hesitate to call our office.  [place]: [address], [place], [place] [address] -- [phone]  [place]: [address] [place], [place] [address] -- [phone]  [place]: [address]  [place], [place] [address] -- [phone]  [place]: [address], [address], [place], [place] [address] -- [phone]  Website:  [email or web address] 

## 2019-01-17 ENCOUNTER — Encounter: Payer: Self-pay | Admitting: Podiatry

## 2019-01-17 NOTE — Progress Notes (Signed)
He presents today for follow-up of his left Achilles tendon and the MRI results.  He states that is getting worse and unable to have to have this thing fixed.  Objective: Vital signs are stable he is alert and oriented x3.  Pulses are palpable.  He has severe pain on palpation of the entire length of the Achilles tendon particularly at the insertion site and posterior aspect of the left heel.  MRI does suggest distal tendinopathy with some small insertional tears.  Retrocalcaneal bursitis.  Assessment: Retrocalcaneal bursitis Achilles tendinitis gastroc equinus and heel spur.  Plan: Discussed etiology pathology conservative versus surgical therapies at this point we consented him for a gastroc recession a retrocalcaneal heel spur resection with an Achilles tenolysis.  He understands that he will also need a cast and will be nonweightbearing for a period of 8 to 12 weeks.  He understands this and is amenable to it.  We did discuss the possible postop complications which may include but not limited to postop pain bleeding swelling infection recurrence need for further surgery loss of digit loss of limb loss of life.  He signed a 3 page of the consent form today and I will follow-up with him in the near future for surgical intervention.

## 2019-01-19 ENCOUNTER — Encounter: Payer: Self-pay | Admitting: Podiatry

## 2019-01-19 ENCOUNTER — Telehealth: Payer: Self-pay | Admitting: *Deleted

## 2019-01-19 NOTE — Telephone Encounter (Signed)
"  I have some questions.  I was reading my materials and it says I need to have a history and physical form completed.  What form is that.  I didn't receive one?"  You do not need to have that done.  Your surgery is not going to be done at the hospital.  "That's good!  I also need to get some dates that are available for me to have the surgery."  Dr. Milinda Pointer can do it on February 02, 2019 or September 4, 11, 18, or 25.  "What about in October?"  He can do it any Friday in October except for March 30, 2019.  "So he can do it on October 30?"  Yes, that is correct.  "Let me get back to you with a date.  I need to think it over."

## 2019-01-19 NOTE — Telephone Encounter (Signed)
"  I'm calling to schedule my surgery."  What date did you decide on?  "I'd like to do it February 16, 2019.  Is there a time?"  Someone from the surgical center will call you a day or two prior to your surgery date.  That person will give you your arrival time.  You need to go online and register with the surgical center, the instructions are in the brochure that we gave you.  I'll get your surgery scheduled for 02/16/2019.

## 2019-01-22 ENCOUNTER — Encounter: Payer: Self-pay | Admitting: Podiatry

## 2019-01-27 ENCOUNTER — Encounter: Payer: Self-pay | Admitting: Podiatry

## 2019-01-31 DIAGNOSIS — S0502XA Injury of conjunctiva and corneal abrasion without foreign body, left eye, initial encounter: Secondary | ICD-10-CM | POA: Diagnosis not present

## 2019-02-02 ENCOUNTER — Telehealth: Payer: Self-pay | Admitting: *Deleted

## 2019-02-02 NOTE — Telephone Encounter (Signed)
DOS 02/16/2019 MANIPULATION OF ANKLE - 27860; CALCANEAL OSTECTOMY - [id number]; ACHILLES TENOLYSIS - [id number]; AND GASTROCNEMIUS RECESS - [phone] OF THE LT FOOT  BCBS: Effective Date - 06/07/2018 - 06/06/9998   In-Network    Max Per Benefit Period Year-to-Date Remaining  CoInsurance     Deductible  $750.00 750.00  Out-Of-Pocket  $2,000.00 [id number]   Not Applicable Individual  Copay Coinsurance  Not Applicable  0%

## 2019-02-08 ENCOUNTER — Encounter: Payer: Self-pay | Admitting: Podiatry

## 2019-02-09 DIAGNOSIS — Z1159 Encounter for screening for other viral diseases: Secondary | ICD-10-CM | POA: Diagnosis not present

## 2019-02-15 ENCOUNTER — Other Ambulatory Visit: Payer: Self-pay | Admitting: Podiatry

## 2019-02-15 MED ORDER — CEPHALEXIN 500 MG PO CAPS: 500.0000 mg | ORAL_CAPSULE | Freq: Three times a day (TID) | ORAL | 0 refills | Status: DC

## 2019-02-15 MED ORDER — ONDANSETRON HCL 4 MG PO TABS: 4.0000 mg | ORAL_TABLET | Freq: Three times a day (TID) | ORAL | 0 refills | Status: DC | PRN

## 2019-02-15 MED ORDER — OXYCODONE-ACETAMINOPHEN 10-325 MG PO TABS: 1.0000 | ORAL_TABLET | Freq: Three times a day (TID) | ORAL | 0 refills | Status: AC | PRN

## 2019-02-16 ENCOUNTER — Encounter: Payer: Self-pay | Admitting: Podiatry

## 2019-02-16 DIAGNOSIS — M7732 Calcaneal spur, left foot: Secondary | ICD-10-CM | POA: Diagnosis not present

## 2019-02-16 DIAGNOSIS — M62462 Contracture of muscle, left lower leg: Secondary | ICD-10-CM | POA: Diagnosis not present

## 2019-02-16 DIAGNOSIS — M216X2 Other acquired deformities of left foot: Secondary | ICD-10-CM | POA: Diagnosis not present

## 2019-02-16 DIAGNOSIS — M25572 Pain in left ankle and joints of left foot: Secondary | ICD-10-CM | POA: Diagnosis not present

## 2019-02-16 DIAGNOSIS — M70031 Crepitant synovitis (acute) (chronic), right wrist: Secondary | ICD-10-CM | POA: Diagnosis not present

## 2019-02-16 DIAGNOSIS — G4733 Obstructive sleep apnea (adult) (pediatric): Secondary | ICD-10-CM | POA: Diagnosis not present

## 2019-02-16 DIAGNOSIS — M7662 Achilles tendinitis, left leg: Secondary | ICD-10-CM | POA: Diagnosis not present

## 2019-02-16 DIAGNOSIS — M25775 Osteophyte, left foot: Secondary | ICD-10-CM | POA: Diagnosis not present

## 2019-02-16 HISTORY — PX: ACHILLES TENDON REPAIR: SUR1153

## 2019-02-22 ENCOUNTER — Ambulatory Visit (INDEPENDENT_AMBULATORY_CARE_PROVIDER_SITE_OTHER): Payer: BC Managed Care – PPO

## 2019-02-22 ENCOUNTER — Ambulatory Visit (INDEPENDENT_AMBULATORY_CARE_PROVIDER_SITE_OTHER): Payer: BC Managed Care – PPO | Admitting: Podiatry

## 2019-02-22 ENCOUNTER — Encounter: Payer: Self-pay | Admitting: Podiatry

## 2019-02-22 ENCOUNTER — Other Ambulatory Visit: Payer: Self-pay

## 2019-02-22 VITALS — BP 136/80 | HR 82 | Temp 97.3°F

## 2019-02-22 DIAGNOSIS — S86012D Strain of left Achilles tendon, subsequent encounter: Secondary | ICD-10-CM

## 2019-02-22 DIAGNOSIS — Z9889 Other specified postprocedural states: Secondary | ICD-10-CM

## 2019-02-22 NOTE — Progress Notes (Signed)
He presents today for his first postop visit date of surgery is 02/16/2019 status post manipulation of the ankle calcaneal ostectomy gastroc recession Achilles tenolysis and cast application.  He denies fever chills nausea vomiting muscle aches pains calf pain back pain chest pain shortness of breath.  Continues to use his knee scooter regularly and states that he is only had 2 Percocet and the rest of the time he has been taking ibuprofen.  Objective: Vital signs are stable he is alert and oriented x3 presents with his cast on which is dry and clean.  Has great range of motion of the toes good sensation and capillary fill time is immediate.  Cast is loosening proximally but is snug distally.  No open lesions or wounds are visible.  Radiographs taken today lateral view demonstrates nicely resected retrocalcaneal spur.  Assessment: Well-healing surgical foot.  Plan: Leave this cast on until next week and new cast will be placed.  He will notify us with any questions or concerns shortness of breath or calf pain.

## 2019-03-01 ENCOUNTER — Ambulatory Visit (INDEPENDENT_AMBULATORY_CARE_PROVIDER_SITE_OTHER): Payer: BC Managed Care – PPO | Admitting: Podiatry

## 2019-03-01 ENCOUNTER — Other Ambulatory Visit: Payer: Self-pay

## 2019-03-01 VITALS — Temp 98.5°F

## 2019-03-01 DIAGNOSIS — S86012D Strain of left Achilles tendon, subsequent encounter: Secondary | ICD-10-CM | POA: Diagnosis not present

## 2019-03-01 NOTE — Progress Notes (Signed)
He presents today for postop visit states my foot is feeling okay I have some discomfort in the back of my heel and the calf swells a little bit but I not been elevating it like I should.  Continue to take his aspirin and his ibuprofen he states.  He denies fever chills nausea vomiting muscle aches pains calf pain back pain chest pain shortness of breath.  Objective: Vital signs are stable he is alert and oriented x3  Cast is intact was removed demonstrates minimal edema to the leg no area of increased warmth in the calf no pain on medial lateral compression of the calf.  No purulence no malodor no erythema cellulitis drainage or odor.  Staples are intact margins well coapted.  He has good plantar flexion against resistance.  Assessment: Well-healing surgical foot.  Plan: Redressed today with compression encouraged him to take aspirin 325 encouraged continue use of Advil as needed for pain control.  Placed him back in another cast.

## 2019-03-06 ENCOUNTER — Encounter: Payer: Self-pay | Admitting: Podiatry

## 2019-03-15 ENCOUNTER — Ambulatory Visit (INDEPENDENT_AMBULATORY_CARE_PROVIDER_SITE_OTHER): Payer: BC Managed Care – PPO | Admitting: Podiatry

## 2019-03-15 ENCOUNTER — Encounter: Payer: Self-pay | Admitting: Podiatry

## 2019-03-15 ENCOUNTER — Other Ambulatory Visit: Payer: Self-pay

## 2019-03-15 DIAGNOSIS — Z9889 Other specified postprocedural states: Secondary | ICD-10-CM | POA: Diagnosis not present

## 2019-03-15 NOTE — Progress Notes (Signed)
He presents today for postop visit he is 4 weeks out status post gastroc recession Achilles tenolysis retrocalcaneal spur resection manipulation of ankle and cast application.  Objective: He denies fever chills nausea vomiting muscle aches and pains.  Presents today cast to the left lower extremity.  Was removed demonstrates no erythema edema cellulitis drainage odor staples are intact once removed margins remain well coapted he has good plantar flexion against resistance.  No calf pain on palpation is not warm to the touch.  Pulses are palpable.  Assessment: Well-healing surgical foot.  Plan: Placed in a cam walker today he will start partial weightbearing over the next 2 weeks follow-up with him at that time.

## 2019-03-18 ENCOUNTER — Encounter: Payer: Self-pay | Admitting: Podiatry

## 2019-03-22 ENCOUNTER — Encounter: Payer: Self-pay | Admitting: Podiatry

## 2019-03-27 ENCOUNTER — Other Ambulatory Visit: Payer: Self-pay | Admitting: Family Medicine

## 2019-03-27 DIAGNOSIS — F321 Major depressive disorder, single episode, moderate: Secondary | ICD-10-CM

## 2019-03-29 ENCOUNTER — Ambulatory Visit (INDEPENDENT_AMBULATORY_CARE_PROVIDER_SITE_OTHER): Payer: BC Managed Care – PPO

## 2019-03-29 ENCOUNTER — Ambulatory Visit (INDEPENDENT_AMBULATORY_CARE_PROVIDER_SITE_OTHER): Payer: BC Managed Care – PPO | Admitting: Podiatry

## 2019-03-29 ENCOUNTER — Other Ambulatory Visit: Payer: Self-pay

## 2019-03-29 DIAGNOSIS — S86012D Strain of left Achilles tendon, subsequent encounter: Secondary | ICD-10-CM

## 2019-03-29 DIAGNOSIS — Z9889 Other specified postprocedural states: Secondary | ICD-10-CM

## 2019-03-29 NOTE — Progress Notes (Signed)
Subjective: Adam Mcdonald is a 53 y.o. is seen today in office s/p gastrocnemius recession, calcaneal osteotomy with manipulation of ankle joint preformed on 02/16/2019 with Dr. Milinda Pointer.  He states that he stands about 2-3 times a day for about 20 to 30 seconds.  Other than that he has not put any weight on his foot.  He has been in the cam boot.  He states he is not sure how much weight to put on his foot.  Denies any systemic complaints such as fevers, chills, nausea, vomiting. No calf pain, chest pain, shortness of breath.   Objective: General: No acute distress, AAOx3  DP/PT pulses palpable 2/4, CRT < 3 sec to all digits.  Protective sensation intact. Motor function intact.  RIGHT foot: Incision is well coapted without any evidence of dehiscence and a scar is formed.  There is no surrounding erythema, ascending cellulitis, fluctuance, crepitus, malodor, drainage/purulence. There is minimal edema around the surgical site. There is minimal pain along the surgical site close to the [facility].  No other areas of tenderness to bilateral lower extremities.  No other open lesions or pre-ulcerative lesions.  No pain with calf compression, swelling, warmth, erythema.   Assessment and Plan:  Status post left foot surgery, doing well with no complications   -Treatment options discussed including all alternatives, risks, and complications -X-rays obtained reviewed.  No evidence of acute fracture. -He has not been putting any weight on his foot.  We discussed transition to partial weightbearing.  A long discussion about amount of time that he can stand.  I did not put him back into a regular shoe today because he is not been doing any activity.  We discussed range of motion exercises at home and we discussed how to be partial weightbearing.  I will have him follow-up in 2 weeks with Dr. Milinda Pointer help with that point possibly start physical therapy and transition to weightbearing in the cam boot or shoe depending on  how he is doing. -Ice/elevation -Pain medication as needed. -Monitor for any clinical signs or symptoms of infection and DVT/PE and directed to call the office immediately should any occur or go to the ER. -Follow-up in 2 weeks with Dr. Milinda Pointer or sooner if any problems arise. In the meantime, encouraged to call the office with any questions, concerns, change in symptoms.   Celesta Gentile, DPM

## 2019-04-12 ENCOUNTER — Telehealth: Payer: Self-pay | Admitting: *Deleted

## 2019-04-12 ENCOUNTER — Other Ambulatory Visit: Payer: Self-pay

## 2019-04-12 ENCOUNTER — Encounter: Payer: Self-pay | Admitting: Podiatry

## 2019-04-12 ENCOUNTER — Ambulatory Visit (INDEPENDENT_AMBULATORY_CARE_PROVIDER_SITE_OTHER): Payer: Self-pay | Admitting: Podiatry

## 2019-04-12 DIAGNOSIS — S86012D Strain of left Achilles tendon, subsequent encounter: Secondary | ICD-10-CM

## 2019-04-12 DIAGNOSIS — Z9889 Other specified postprocedural states: Secondary | ICD-10-CM

## 2019-04-12 NOTE — Telephone Encounter (Signed)
-----   Message from Rip Harbour, [facility] sent at 04/12/2019  8:45 AM EST ----- Regarding: PT referral [facility]   Evaluate and treat - DOS 02/16/19 Achilles repair left   Focus on strengthening, flexibility, edema left   Duration: 3 x week x 4 weeks

## 2019-04-12 NOTE — Progress Notes (Signed)
He presents today date of surgery 02/16/2019 with manipulation of ankle gastroc recession retrocalcaneal heel spur resection and Achilles tenolysis.  States that is feeling a lot better feeling pretty good states that he has just been in a soft soled shoe for the last few days.  Denies fever chills nausea vomiting muscle aches pains calf pain back pain chest pain shortness of breath.  States that he is got to where he can walk further further distance.  Objective: Vital signs are stable he is alert and oriented x3.  Pulses are palpable.  Much decrease in edema to the left lower extremity than previously noted.  He has good plantar flexion against resistance wound is completely closed but remains a little sensitive along with the incision line.  Assessment: Well-healing surgical foot.  Plan: I think we need to send him to physical therapy for about a month prior to him starting back to work so we will extend his out of work benefit and follow-up with him 1 month after physical therapy working to send him to [facility] physical therapy and [facility].  I will follow-up with him in 1 month

## 2019-04-12 NOTE — Telephone Encounter (Signed)
I called [facility] and she states she is able to schedule pt from the Epic Referral.

## 2019-04-17 ENCOUNTER — Ambulatory Visit: Payer: BC Managed Care – PPO | Attending: Podiatry | Admitting: Physical Therapy

## 2019-04-17 ENCOUNTER — Other Ambulatory Visit: Payer: Self-pay

## 2019-04-17 ENCOUNTER — Encounter: Payer: Self-pay | Admitting: Physical Therapy

## 2019-04-17 DIAGNOSIS — M25572 Pain in left ankle and joints of left foot: Secondary | ICD-10-CM | POA: Insufficient documentation

## 2019-04-17 DIAGNOSIS — R600 Localized edema: Secondary | ICD-10-CM | POA: Insufficient documentation

## 2019-04-17 DIAGNOSIS — M6281 Muscle weakness (generalized): Secondary | ICD-10-CM | POA: Insufficient documentation

## 2019-04-17 DIAGNOSIS — M25672 Stiffness of left ankle, not elsewhere classified: Secondary | ICD-10-CM | POA: Diagnosis not present

## 2019-04-17 NOTE — Patient Instructions (Signed)
Access Code: [email or web address]  URL: [email or web address]  Date: 04/17/2019  Prepared by: [place] Sitting Calf Stretch with Strap - 3 reps - 1 sets - 20 hold - 1x daily - 7x weekly  Towel Scrunches - 10 reps - 1 sets - 1x daily - 7x weekly  Seated Ankle Dorsiflexion with Resistance - 10 reps - 1 sets - 1x daily - 7x weekly  Standing Weight Shift - 10 reps - 1 sets - 1x daily - 7x weekly

## 2019-04-17 NOTE — Therapy (Signed)
[facility] [address], [place], [place], [address] Phone: [phone]   Fax:  [phone]  Physical Therapy Treatment  Patient Details  Name: Adam Mcdonald MRN: [id number] Date of Birth: [birth year] Referring Provider (PT): Dr. Tyson Dense    Encounter Date: 04/17/2019  PT End of Session - 04/17/19 0838    Visit Number  1    Date for PT Re-Evaluation  06/12/19    Authorization Type  BCBS  Had previous PT 36 visits left    PT Start Time  0843    PT Stop Time  0925    PT Time Calculation (min)  42 min    Activity Tolerance  Patient tolerated treatment well       Past Medical History:  Diagnosis Date  . Chicken pox   . Depression   . History of MRSA infection   . Sleep apnea   . Ventral hernia without obstruction or gangrene   . Vitamin D deficiency     Past Surgical History:  Procedure Laterality Date  . UVULOPALATOPHARYNGOPLASTY      There were no vitals filed for this visit.  Subjective Assessment - 04/17/19 0841    Subjective  At work wearing safety boots and jogged to catch up with coworker in October 2019.  Next day felt pain in Achilles.  Tried conservative measures first.  Microtear and bone spur, inflammation.  Wouldn't heal.  Surgery 02/16/19.  Cut into gastroc to lengthen.   Removed part of fat pad.  They removed a large portion of bone so it's very painful on the bottom of the heel and tightness front of ankle.  Hard cast 4 weeks. Boot 3 1/2 weeks.  Went to soft shoes last week.  No restrictions except no work.    Pertinent History  Surgery 02/16/19;  MD 12/3    Limitations  House hold activities;Standing;Walking    How long can you stand comfortably?  > 10 minutes    How long can you walk comfortably?  not even a little bit;  in [place] walked across the store but very painful 6/10    Patient Stated Goals  ROM, normal walking gait;  I walk slower now with short steps;  walk down steps normally  without crab walking;  return to weight lifting goblet squat, dead lifts, TRX has home    Currently in Pain?  Yes    Pain Score  3     Pain Location  Foot    Pain Orientation  Left    Pain Type  Surgical pain    Pain Onset  More than a month ago    Pain Frequency  Constant    Aggravating Factors   walking 5-6/10 , standing, steps;  elevate it on coffee table    Pain Relieving Factors  Advil;  not using it         [facility] PT Assessment - 04/17/19 0001      Assessment   Medical Diagnosis  left Achilles surgery     Referring Provider (PT)  Dr. Tyson Dense     Onset Date/Surgical Date  02/16/19    Next MD Visit  05/10/19     Prior Therapy  6-8 weeks prior to surgery       Precautions   Precautions  None      Restrictions   Weight Bearing Restrictions  No      Balance Screen   Has the patient fallen in the past  6 months  No    Has the patient had a decrease in activity level because of a fear of falling?   No    Is the patient reluctant to leave their home because of a fear of falling?   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    Leisure  read; walking around [employer]       Observation/Other Assessments   Focus on Therapeutic Outcomes (FOTO)   52% limitation       Figure 8 Edema   Figure 8 - Right   56 cm     Figure 8 - Left   61 cm       Posture/Postural Control   Posture Comments  incisions appear well healed;  bil pes planus; left calf atrophy      AROM   Overall AROM Comments  Unable to SLS on left without UE support;  decreased calcaneal, talocrual and subtalar joint mobility     Right Ankle Dorsiflexion  20     Right Ankle Plantar Flexion  55    Right Ankle Inversion  46    Right Ankle Eversion  36    Left Ankle Dorsiflexion  0    Left Ankle Plantar Flexion  45    Left Ankle Inversion  16    Left Ankle Eversion  14      Strength   Left Ankle Dorsiflexion  3+/5    Left Ankle Plantar Flexion  3/5    Left Ankle Inversion  3+/5    Left Ankle Eversion  3+/5      Ambulation/Gait   Assistive device  None    Gait Pattern  Decreased stance time - left;Decreased stride length    Gait Comments  wearing shoe                            PT Education - 04/17/19 0942    Education Details  Access Code: [email or web address]   gentle towel gastroc stretch;  yellow band ankle DF;  standing weight shifting onto left;  ice/elevation above heart level for edema control    Person(s) Educated  Patient    Methods  Explanation;Demonstration;Handout    Comprehension  Returned demonstration;Verbalized understanding       PT Short Term Goals - 04/17/19 1911      PT SHORT TERM GOAL #1   Title  The patient will demonstrate knowledge of basic self care including edema control strategies, ROM, low level strengthening    Time  4    Period  Weeks    Status  New    Target Date  05/15/19      PT SHORT TERM GOAL #2   Title  The patient will be able to walk in the grocery store with pain level 5/10 or less    Time  4    Period  Weeks    Status  New      PT SHORT  TERM GOAL #3   Title  The patient will have improved left ankle DF to 4 degrees, PF to 50 degrees, Inversion and eversion to 18 degrees    Time  4    Period  Weeks    Status  New      PT SHORT TERM GOAL #4   Title  The patient will have decreased figure 8 girth measurement on left to 59 cm    Time  4    Period  Weeks    Status  New        PT Long Term Goals - 04/17/19 1914      PT LONG TERM GOAL #1   Title  The patient will be independent with safe self progression of HEP    Time  8    Period  Weeks    Status  New    Target Date   06/12/19      PT LONG TERM GOAL #2   Title  The patient will be able to walk 20 minutes with pain level 4/10 or less    Time  8    Period  Weeks    Status  New      PT LONG TERM GOAL #3   Title  The patient will have improved ankle DF to 8 degrees and PF to 55 degrees needed for greater ease ascending/descending steps at this apartment    Time  8    Period  Weeks    Status  New      PT LONG TERM GOAL #4   Title  The patient will have improved left ankle/foot strength 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  Sustained a microtear of the Achilles further aggravated by a bone spur and chronic inflammation that did not respond to conservative management and PT.  He underwent surgery 02/16/19 for  gastroc  lengthening, bone spur removal and fat pad removal.   He was in a hard cast 4 weeks followed by a boot for 3 1/2 weeks.  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  Moderate limp with decreased stance time and stride length.  He is unable to single leg stand on left without UE support.  Decreased left ROM:  dorisflexion 0, plantarflexion 45,  inversion 16, eversion 14 degrees.  Ankle strength grossly 3+/5.  Moderate edema with 5 cm discrepancy between left /right ankle.  He would benefit from PT to address these deficits.    Personal Factors and Comorbidities  Past/Current Experience;Time since onset of injury/illness/exacerbation    Examination-Activity Limitations  Stairs;Lift;Locomotion Level;Stand;Carry;Squat    Examination-Participation Restrictions  Community Activity;Other;Shop;Cleaning    Stability/Clinical Decision Making  Stable/Uncomplicated    Clinical Decision Making  Low    Rehab Potential  Good    PT Frequency  2x / week    PT Duration  8 weeks    PT Treatment/Interventions  ADLs/Self Care Home Management;Cryotherapy;Electrical Stimulation;Iontophoresis 4mg /ml Dexamethasone;Therapeutic activities;Therapeutic exercise;Neuromuscular re-education;Manual techniques;Taping;Patient/family education;Ultrasound;Dry needling;Moist Heat    PT Next Visit Plan  ankle/foot joint mobs; ankle band or isometrics if too painful;  bike or Nu-Step;  vasocompression for edema control;  foot on step rocking for ROM; seated rocker board    PT Home Exercise Plan  Access Code: [email or web address]    Consulted and Agree with Plan of Care  Patient       Patient will benefit from skilled therapeutic intervention in order to improve the following deficits and impairments:  Abnormal gait, Pain, Decreased range of motion, Decreased activity tolerance, Impaired perceived functional ability, Decreased strength, Increased edema  Visit Diagnosis: Pain in left ankle and joints of left foot - Plan: PT plan of care cert/re-cert  Stiffness of left ankle, not elsewhere classified - Plan: PT plan of care cert/re-cert  Muscle weakness (generalized) - Plan: PT plan of care cert/re-cert  Localized edema - Plan: PT plan of care cert/re-cert     Problem List Patient Active Problem List   Diagnosis Date Noted  . Low back pain 06/23/2018  . Depression, major,  single episode, moderate ([place]) 06/02/2018  . Vitamin D deficiency 06/02/2018   Ruben Im, PT 04/17/19 7:24 PM Phone: [phone] Fax: [phone] Alvera Singh 04/17/2019, 7:23 PM  [facility] [address], [place], [place], [address] Phone: [phone]   Fax:  [phone]  Name: Adam Mcdonald MRN: [id number] Date of Birth: Oct 27, 1965

## 2019-04-20 ENCOUNTER — Other Ambulatory Visit: Payer: Self-pay

## 2019-04-20 ENCOUNTER — Ambulatory Visit: Payer: BC Managed Care – PPO | Admitting: Physical Therapy

## 2019-04-20 ENCOUNTER — Encounter: Payer: Self-pay | Admitting: Physical Therapy

## 2019-04-20 DIAGNOSIS — M25672 Stiffness of left ankle, not elsewhere classified: Secondary | ICD-10-CM | POA: Diagnosis not present

## 2019-04-20 DIAGNOSIS — M25572 Pain in left ankle and joints of left foot: Secondary | ICD-10-CM | POA: Diagnosis not present

## 2019-04-20 DIAGNOSIS — M6281 Muscle weakness (generalized): Secondary | ICD-10-CM | POA: Diagnosis not present

## 2019-04-20 DIAGNOSIS — R600 Localized edema: Secondary | ICD-10-CM | POA: Diagnosis not present

## 2019-04-20 NOTE — Therapy (Signed)
[facility] [address], [place], [place], [address] Phone: [phone]   Fax:  [phone]  Physical Therapy Treatment  Patient Details  Name: Adam Mcdonald MRN: [id number] Date of Birth: Oct 29, 1965 Referring Provider (PT): Dr. Tyson Dense    Encounter Date: 04/20/2019  PT End of Session - 04/20/19 1148    Visit Number  2    Date for PT Re-Evaluation  06/12/19    Authorization Type  BCBS  Had previous PT 36 visits left    PT Start Time  1100    PT Stop Time  1150    PT Time Calculation (min)  50 min    Activity Tolerance  Patient limited by pain       Past Medical History:  Diagnosis Date  . Chicken pox   . Depression   . History of MRSA infection   . Sleep apnea   . Ventral hernia without obstruction or gangrene   . Vitamin D deficiency     Past Surgical History:  Procedure Laterality Date  . UVULOPALATOPHARYNGOPLASTY      There were no vitals filed for this visit.  Subjective Assessment - 04/20/19 1103    Subjective  It's been a painful week.  Walking is very painful.  Stretching is painful.  Elevating even hurts it.  Pain is more on the back of the heel in the Achilles area.  Not as much on the fat pad area.    Currently in Pain?  Yes    Pain Score  2     Pain Orientation  Left    Pain Type  Surgical pain                       [facility] Adult PT Treatment/Exercise - 04/20/19 0001      Vasopneumatic   Number Minutes Vasopneumatic   10 minutes    Vasopnuematic Location   Ankle    Vasopneumatic Pressure  Low    Vasopneumatic Temperature   coldest setting       Manual Therapy   Joint Mobilization  grade 2 metatarsal, talocrual mobs 20 sec 5x    Soft tissue mobilization  gastroc/soleus, anterior tib, peri-incisional       Ankle Exercises: Aerobic   Nustep  11min L1 while discussing progress       Ankle Exercises: Seated   Towel Crunch  5 reps    Heel Raises  Left;10 reps    Heel  Raises Limitations  painful unless performs very slowly     Heel Slides Limitations  on floor slider 15x     Other Seated Ankle Exercises  rocker board PF/DF 20x   modified to limit PF secondary to ant ankle pain      Ankle Exercises: Supine   T-Band  red band DF, inversion and eversion isometrics 5x 5 sec holds each                PT Short Term Goals - 04/17/19 1911      PT SHORT TERM GOAL #1   Title  The patient will demonstrate knowledge of basic self care including edema control strategies, ROM, low level strengthening    Time  4    Period  Weeks    Status  New    Target Date  05/15/19      PT SHORT TERM GOAL #2   Title  The patient will be able to walk in the grocery  store with pain level 5/10 or less    Time  4    Period  Weeks    Status  New      PT SHORT TERM GOAL #3   Title  The patient will have improved left ankle DF to 4 degrees, PF to 50 degrees, Inversion and eversion to 18 degrees    Time  4    Period  Weeks    Status  New      PT SHORT TERM GOAL #4   Title  The patient will have decreased figure 8 girth measurement on left to 59 cm    Time  4    Period  Weeks    Status  New        PT Long Term Goals - 04/17/19 1914      PT LONG TERM GOAL #1   Title  The patient will be independent with safe self progression of HEP    Time  8    Period  Weeks    Status  New    Target Date  06/12/19      PT LONG TERM GOAL #2   Title  The patient will be able to walk 20 minutes with pain level 4/10 or less    Time  8    Period  Weeks    Status  New      PT LONG TERM GOAL #3   Title  The patient will have improved ankle DF to 8 degrees and PF to 55 degrees needed for greater ease ascending/descending steps at this apartment    Time  8    Period  Weeks    Status  New      PT LONG TERM GOAL #4   Title  The patient will have improved left ankle/foot strength 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    Time  8    Period  Weeks     Status  New      PT LONG TERM GOAL #5   Title  The patient will be able to single leg stand at least 5 sec    Time  8    Period  Weeks    Status  New      Additional Long Term Goals   Additional Long Term Goals  Yes      PT LONG TERM GOAL #6   Title  FOTO functional outcome score improved from 52% limitation to 37% indicating improved function with less pain    Time  8    Period  Weeks    Status  New            Plan - 04/20/19 1148    Clinical Impression Statement  The patient reports pain increasing > 5/10 with some exercises therefore modified to stay within a smaller range of motion, performed non weight bearing or performed isometrically.  Myofascial changes along gastroc.  Improving ankle/foot joint mobility.  Decreased swelling and pain relief with vascompression.    PT Frequency  2x / week    PT Duration  8 weeks    PT Treatment/Interventions  ADLs/Self Care Home Management;Cryotherapy;Electrical Stimulation;Iontophoresis 4mg /ml Dexamethasone;Therapeutic activities;Therapeutic exercise;Neuromuscular re-education;Manual techniques;Taping;Patient/family education;Ultrasound;Dry needling;Moist Heat    PT Next Visit Plan  ankle/foot joint mobs; ankle band or isometrics if too painful;  bike or Nu-Step;  vasocompression  for edema control increase to moderate compression;  foot on step rocking for ROM; seated rocker board or BAPS  PT Home Exercise Plan  Access Code: [email or web address]       Patient will benefit from skilled therapeutic intervention in order to improve the following deficits and impairments:  Abnormal gait, Pain, Decreased range of motion, Decreased activity tolerance, Impaired perceived functional ability, Decreased strength, Increased edema  Visit Diagnosis: Pain in left ankle and joints of left foot  Stiffness of left ankle, not elsewhere classified  Muscle weakness (generalized)  Localized edema     Problem List Patient Active Problem List    Diagnosis Date Noted  . Low back pain 06/23/2018  . Depression, major, single episode, moderate ([place]) 06/02/2018  . Vitamin D deficiency 06/02/2018   Ruben Im, PT 04/20/19 11:59 AM Phone: [phone] Fax: [phone] Alvera Singh 04/20/2019, 11:58 AM  [facility] [address] [place], [place], [address] Phone: [phone]   Fax:  [phone]  Name: Adam Mcdonald MRN: [id number] Date of Birth: March 26, 1966

## 2019-04-23 ENCOUNTER — Other Ambulatory Visit: Payer: Self-pay | Admitting: Family Medicine

## 2019-04-23 DIAGNOSIS — G8929 Other chronic pain: Secondary | ICD-10-CM

## 2019-04-24 ENCOUNTER — Encounter: Payer: Self-pay | Admitting: Physical Therapy

## 2019-04-24 ENCOUNTER — Ambulatory Visit: Payer: BC Managed Care – PPO | Admitting: Physical Therapy

## 2019-04-24 ENCOUNTER — Other Ambulatory Visit: Payer: Self-pay

## 2019-04-24 DIAGNOSIS — M6281 Muscle weakness (generalized): Secondary | ICD-10-CM | POA: Diagnosis not present

## 2019-04-24 DIAGNOSIS — M25572 Pain in left ankle and joints of left foot: Secondary | ICD-10-CM

## 2019-04-24 DIAGNOSIS — M25672 Stiffness of left ankle, not elsewhere classified: Secondary | ICD-10-CM

## 2019-04-24 DIAGNOSIS — R600 Localized edema: Secondary | ICD-10-CM

## 2019-04-24 NOTE — Therapy (Signed)
[facility] [address], [place], [place], [address] Phone: [phone]   Fax:  [phone]  Physical Therapy Treatment  Patient Details  Name: Adam Mcdonald MRN: [id number] Date of Birth: 09-26-1965 Referring Provider (PT): Dr. Tyson Dense    Encounter Date: 04/24/2019  PT End of Session - 04/24/19 1010    Visit Number  3    Date for PT Re-Evaluation  06/12/19    Authorization Type  BCBS  Had previous PT 36 visits left    PT Start Time  0930    PT Stop Time  1013    PT Time Calculation (min)  43 min    Activity Tolerance  Patient limited by pain    Behavior During Therapy  [facility] for tasks assessed/performed       Past Medical History:  Diagnosis Date  . Chicken pox   . Depression   . History of MRSA infection   . Sleep apnea   . Ventral hernia without obstruction or gangrene   . Vitamin D deficiency     Past Surgical History:  Procedure Laterality Date  . UVULOPALATOPHARYNGOPLASTY      There were no vitals filed for this visit.  Subjective Assessment - 04/24/19 0933    Subjective  Pt feels that things are a little bit better but still having pain.    Currently in Pain?  Yes    Pain Score  2     Pain Location  Heel    Pain Orientation  Left;Posterior    Pain Descriptors / Indicators  Aching    Pain Type  Acute pain    Pain Radiating Towards  none    Pain Frequency  Intermittent    Aggravating Factors   up doing things    Pain Relieving Factors  sleeping and resting                       [facility] Adult PT Treatment/Exercise - 04/24/19 0001      Vasopneumatic   Number Minutes Vasopneumatic   10 minutes    Vasopnuematic Location   Ankle    Vasopneumatic Pressure  Medium    Vasopneumatic Temperature   34 deg      Ankle Exercises: Aerobic   Recumbent Bike  L1 x4 min, PT present to discuss progress    Nustep  --      Ankle Exercises: Seated   Heel Raises  5 reps    Heel Raises  Limitations  x3 sets     Other Seated Ankle Exercises  rocker board x20 reps PF/DF      Ankle Exercises: Standing   Other Standing Ankle Exercises  Lt step up 4" box with blue cushion x10 reps (1 UE support)       Ankle Exercises: Supine   Other Supine Ankle Exercises  Lt ankle pumps x20 reps; Lt ankle circles clockwise and counterclockwise x15 reps each     Other Supine Ankle Exercises  Lt ankle eccentric plantar flexion with PT manual resistance             PT Education - 04/24/19 1010    Education Details  technique with therex    Person(s) Educated  Patient    Methods  Explanation;Verbal cues    Comprehension  Verbalized understanding;Returned demonstration       PT Short Term Goals - 04/17/19 1911      PT SHORT TERM GOAL #1   Title  The patient will demonstrate knowledge of basic self care including edema control strategies, ROM, low level strengthening    Time  4    Period  Weeks    Status  New    Target Date  05/15/19      PT SHORT TERM GOAL #2   Title  The patient will be able to walk in the grocery store with pain level 5/10 or less    Time  4    Period  Weeks    Status  New      PT SHORT TERM GOAL #3   Title  The patient will have improved left ankle DF to 4 degrees, PF to 50 degrees, Inversion and eversion to 18 degrees    Time  4    Period  Weeks    Status  New      PT SHORT TERM GOAL #4   Title  The patient will have decreased figure 8 girth measurement on left to 59 cm    Time  4    Period  Weeks    Status  New        PT Long Term Goals - 04/17/19 1914      PT LONG TERM GOAL #1   Title  The patient will be independent with safe self progression of HEP    Time  8    Period  Weeks    Status  New    Target Date  06/12/19      PT LONG TERM GOAL #2   Title  The patient will be able to walk 20 minutes with pain level 4/10 or less    Time  8    Period  Weeks    Status  New      PT LONG TERM GOAL #3   Title  The patient will have improved  ankle DF to 8 degrees and PF to 55 degrees needed for greater ease ascending/descending steps at this apartment    Time  8    Period  Weeks    Status  New      PT LONG TERM GOAL #4   Title  The patient will have improved left ankle/foot strength 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 Session focused primarily on restoring gastroc strength both statically and eccentrically. Pt initially had discomfort with Lt LE step ups but was able to complete 3 sets of 5 repetitions with no more than 3/10 discomfort. Pt was educated on ways to decrease swelling  with light compression/ice/elevation at home. Ended with no overall increase in soreness from his arrival. Will continue with current POC.    PT Frequency  2x / week    PT Duration  8 weeks    PT Treatment/Interventions  ADLs/Self Care Home Management;Cryotherapy;Electrical Stimulation;Iontophoresis 4mg /ml Dexamethasone;Therapeutic activities;Therapeutic exercise;Neuromuscular re-education;Manual techniques;Taping;Patient/family education;Ultrasound;Dry needling;Moist Heat    PT Next Visit Plan  ankle band or isometrics if too painful; bike or Nu-Step;  vasocompression  for edema control increase to moderate compression;  foot on step rocking for ROM; seated rocker board or BAPS    PT Home Exercise Plan  Access Code:  [email or web address]       Patient will benefit from skilled therapeutic intervention in order to improve the following deficits and impairments:  Abnormal gait, Pain, Decreased range of motion, Decreased activity tolerance, Impaired perceived functional ability, Decreased strength, Increased edema  Visit Diagnosis: Pain in left ankle and joints of left foot  Stiffness of left ankle, not elsewhere classified  Localized edema  Muscle weakness (generalized)     Problem List Patient Active Problem List   Diagnosis Date Noted  . Low back pain 06/23/2018  . Depression, major, single episode, moderate ([place]) 06/02/2018  . Vitamin D deficiency 06/02/2018   10:29 AM,04/24/19 Sherol Dade PT, DPT [facility] [address] [place], [place], [address] Phone: [phone]   Fax:  [phone]  Name: Brexten Calero MRN: [id number] Date of Birth: 08-20-1965

## 2019-04-26 ENCOUNTER — Other Ambulatory Visit: Payer: Self-pay

## 2019-04-26 ENCOUNTER — Encounter: Payer: Self-pay | Admitting: Physical Therapy

## 2019-04-26 ENCOUNTER — Ambulatory Visit: Payer: BC Managed Care – PPO | Admitting: Physical Therapy

## 2019-04-26 DIAGNOSIS — M25672 Stiffness of left ankle, not elsewhere classified: Secondary | ICD-10-CM | POA: Diagnosis not present

## 2019-04-26 DIAGNOSIS — M6281 Muscle weakness (generalized): Secondary | ICD-10-CM

## 2019-04-26 DIAGNOSIS — R600 Localized edema: Secondary | ICD-10-CM | POA: Diagnosis not present

## 2019-04-26 DIAGNOSIS — M25572 Pain in left ankle and joints of left foot: Secondary | ICD-10-CM

## 2019-04-26 NOTE — Therapy (Signed)
[facility] [address] [place], [place], [address] Phone: [phone]   Fax:  [phone]  Physical Therapy Treatment  Patient Details  Name: Adam Mcdonald MRN: [id number] Date of Birth: 1965/07/29 Referring Provider (PT): Dr. Tyson Dense    Encounter Date: 04/26/2019  PT End of Session - 04/26/19 0928    Visit Number  4    Number of Visits  36    Date for PT Re-Evaluation  06/12/19    Authorization Type  BC[employer]  Had previous PT 36 visits left    PT Start Time  0844    PT Stop Time  0933    PT Time Calculation (min)  49 min    Activity Tolerance  Patient tolerated treatment well       Past Medical History:  Diagnosis Date  . Chicken pox   . Depression   . History of MRSA infection   . Sleep apnea   . Ventral hernia without obstruction or gangrene   . Vitamin D deficiency     Past Surgical History:  Procedure Laterality Date  . UVULOPALATOPHARYNGOPLASTY      There were no vitals filed for this visit.  Subjective Assessment - 04/26/19 0847    Subjective  I walked yesterday 1/4 mile and then I was in a lot of pain.  Woke up in a lot of pain but I took a shower and now it's bettter.    Pertinent History  Surgery 02/16/19;  MD 12/3    Currently in Pain?  Yes    Pain Score  2     Pain Location  Ankle    Pain Orientation  Left;Posterior    Aggravating Factors   walking fast and longer steps    Pain Relieving Factors  shorter strides                       [facility] Adult PT Treatment/Exercise - 04/26/19 0001      Knee/Hip Exercises: Standing   Forward Step Up  Left;Hand Hold: 0;Step Height: 4"    Forward Step Up Limitations  right 2nd step tap       Vasopneumatic   Number Minutes Vasopneumatic   10 minutes    Vasopnuematic Location   Ankle    Vasopneumatic Pressure  Medium;High    Vasopneumatic Temperature   34 deg      Manual Therapy   Joint Mobilization  grade 2 metatarsal, talocrual  mobs 20 sec 5x    Soft tissue mobilization  gastroc/soleus, anterior tib, peri-incisional       Ankle Exercises: Aerobic   Recumbent Bike  L1 x6 min, PT present to discuss progress      Ankle Exercises: Standing   Heel Walk (Round Trip)  right heel taps 15x    Other Standing Ankle Exercises  retro stepping holding red band handles 20x    Other Standing Ankle Exercises  right first and 2nd stap taps 10x       Ankle Exercises: Machines for Strengthening   Cybex Leg Press  55# bil press 20x ; left only 25# 20x                PT Short Term Goals - 04/17/19 1911      PT SHORT TERM GOAL #1   Title  The patient will demonstrate knowledge of basic self care including edema control strategies, ROM, low level strengthening    Time  4  Period  Weeks    Status  New    Target Date  05/15/19      PT SHORT TERM GOAL #2   Title  The patient will be able to walk in the grocery store with pain level 5/10 or less    Time  4    Period  Weeks    Status  New      PT SHORT TERM GOAL #3   Title  The patient will have improved left ankle DF to 4 degrees, PF to 50 degrees, Inversion and eversion to 18 degrees    Time  4    Period  Weeks    Status  New      PT SHORT TERM GOAL #4   Title  The patient will have decreased figure 8 girth measurement on left to 59 cm    Time  4    Period  Weeks    Status  New        PT Long Term Goals - 04/17/19 1914      PT LONG TERM GOAL #1   Title  The patient will be independent with safe self progression of HEP    Time  8    Period  Weeks    Status  New    Target Date  06/12/19      PT LONG TERM GOAL #2   Title  The patient will be able to walk 20 minutes with pain level 4/10 or less    Time  8    Period  Weeks    Status  New      PT LONG TERM GOAL #3   Title  The patient will have improved ankle DF to 8 degrees and PF to 55 degrees needed for greater ease ascending/descending steps at this apartment    Time  8    Period  Weeks     Status  New      PT LONG TERM GOAL #4   Title  The patient will have improved left ankle/foot strength 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    Time  8    Period  Weeks    Status  New      PT LONG TERM GOAL #5   Title  The patient will be able to single leg stand at least 5 sec    Time  8    Period  Weeks    Status  New      Additional Long Term Goals   Additional Long Term Goals  Yes      PT LONG TERM GOAL #6   Title  FOTO functional outcome score improved from 52% limitation to 37% indicating improved function with less pain    Time  8    Period  Weeks    Status  New            Plan - 04/26/19 1959    Clinical Impression Statement  The patient is able to participate in a progression of weight bearing and ROM with mild to moderate increases in pain but states it is tolerable.  Improving ankle/foot joint mobility and gastroc soft tissue mobility.  He reports pain relief with vasocompression and prefers a moderate to high intensity.  Decreasing edema.    Rehab Potential  Good    PT Duration  8 weeks    PT Treatment/Interventions  ADLs/Self Care Home Management;Cryotherapy;Electrical Stimulation;Iontophoresis 4mg /ml Dexamethasone;Therapeutic activities;Therapeutic exercise;Neuromuscular re-education;Manual techniques;Taping;Patient/family education;Ultrasound;Dry  needling;Moist Heat    PT Next Visit Plan  bike;  vasocompression; leg press; weightbearing progression;   foot on step rocking for ROM; manual therapy    PT Home Exercise Plan  Access Code: [email or web address]       Patient will benefit from skilled therapeutic intervention in order to improve the following deficits and impairments:  Abnormal gait, Pain, Decreased range of motion, Decreased activity tolerance, Impaired perceived functional ability, Decreased strength, Increased edema  Visit Diagnosis: Pain in left ankle and joints of left foot  Stiffness of left ankle, not elsewhere  classified  Localized edema  Muscle weakness (generalized)     Problem List Patient Active Problem List   Diagnosis Date Noted  . Low back pain 06/23/2018  . Depression, major, single episode, moderate ([place]) 06/02/2018  . Vitamin D deficiency 06/02/2018   Ruben Im, PT 04/26/19 8:06 PM Phone: [phone] Fax: [phone] Alvera Singh 04/26/2019, 8:05 PM  [facility] [address] [place], [place], [address] Phone: [phone]   Fax:  [phone]  Name: Keydon Marco MRN: [id number] Date of Birth: 05/09/1966

## 2019-05-01 ENCOUNTER — Ambulatory Visit: Payer: BC Managed Care – PPO | Admitting: Physical Therapy

## 2019-05-01 ENCOUNTER — Encounter: Payer: Self-pay | Admitting: Physical Therapy

## 2019-05-01 ENCOUNTER — Other Ambulatory Visit: Payer: Self-pay

## 2019-05-01 DIAGNOSIS — M25572 Pain in left ankle and joints of left foot: Secondary | ICD-10-CM

## 2019-05-01 DIAGNOSIS — M6281 Muscle weakness (generalized): Secondary | ICD-10-CM | POA: Diagnosis not present

## 2019-05-01 DIAGNOSIS — R600 Localized edema: Secondary | ICD-10-CM | POA: Diagnosis not present

## 2019-05-01 DIAGNOSIS — M25672 Stiffness of left ankle, not elsewhere classified: Secondary | ICD-10-CM | POA: Diagnosis not present

## 2019-05-01 NOTE — Therapy (Signed)
[facility] [address] [place], [place], [address] Phone: [phone]   Fax:  [phone]  Physical Therapy Treatment  Patient Details  Name: Adam Mcdonald MRN: [id number] Date of Birth: Jun 12, 1965 Referring Provider (PT): Dr. Tyson Dense    Encounter Date: 05/01/2019  PT End of Session - 05/01/19 1415    Visit Number  5    Number of Visits  36    Date for PT Re-Evaluation  06/12/19    Authorization Type  BCBS  Had previous PT 36 visits left    PT Start Time  0845    PT Stop Time  0928    PT Time Calculation (min)  43 min    Activity Tolerance  Patient tolerated treatment well       Past Medical History:  Diagnosis Date  . Chicken pox   . Depression   . History of MRSA infection   . Sleep apnea   . Ventral hernia without obstruction or gangrene   . Vitamin D deficiency     Past Surgical History:  Procedure Laterality Date  . UVULOPALATOPHARYNGOPLASTY      There were no vitals filed for this visit.  Subjective Assessment - 05/01/19 0908    Subjective  It hurts all the time now about a 2/10.  I get a cramp in my calf.  The Game Ready makes me stiff.  I did goblet squats at home with a weight but I'm not going to use weights for support under my feet next time.    Pertinent History  Surgery 02/16/19;  MD 12/3    Currently in Pain?  Yes    Pain Score  2     Pain Location  Leg    Pain Orientation  Left                       [facility] Adult PT Treatment/Exercise - 05/01/19 0001      Knee/Hip Exercises: Standing   Forward Step Up  Left;5 reps;Hand Hold: 0;Step Height: 6"    Forward Step Up Limitations  right 2nd step tap     Walking with Sports Cord  30# backward only 10x     Other Standing Knee Exercises  10x sit to stand holding 10# weight      Manual Therapy   Joint Mobilization  grade 2 metatarsal, talocrual mobs 20 sec 5x    Soft tissue mobilization  gastroc/soleus, anterior tib,  peri-incisional ;  [employer] yellow light attachment to gastroc       Ankle Exercises: Aerobic   Recumbent Bike  L1 x4 min, PT present to discuss progress      Ankle Exercises: Standing   Vector Stance  Left;5 reps   4 ways   Other Standing Ankle Exercises  right first and 2nd stap taps 10x       Ankle Exercises: Machines for Strengthening   Cybex Leg Press  60# bil press 20x ; left only 30# 10x                PT Short Term Goals - 04/17/19 1911      PT SHORT TERM GOAL #1   Title  The patient will demonstrate knowledge of basic self care including edema control strategies, ROM, low level strengthening    Time  4    Period  Weeks    Status  New    Target Date  05/15/19  PT SHORT TERM GOAL #2   Title  The patient will be able to walk in the grocery store with pain level 5/10 or less    Time  4    Period  Weeks    Status  New      PT SHORT TERM GOAL #3   Title  The patient will have improved left ankle DF to 4 degrees, PF to 50 degrees, Inversion and eversion to 18 degrees    Time  4    Period  Weeks    Status  New      PT SHORT TERM GOAL #4   Title  The patient will have decreased figure 8 girth measurement on left to 59 cm    Time  4    Period  Weeks    Status  New        PT Long Term Goals - 04/17/19 1914      PT LONG TERM GOAL #1   Title  The patient will be independent with safe self progression of HEP    Time  8    Period  Weeks    Status  New    Target Date  06/12/19      PT LONG TERM GOAL #2   Title  The patient will be able to walk 20 minutes with pain level 4/10 or less    Time  8    Period  Weeks    Status  New      PT LONG TERM GOAL #3   Title  The patient will have improved ankle DF to 8 degrees and PF to 55 degrees needed for greater ease ascending/descending steps at this apartment    Time  8    Period  Weeks    Status  New      PT LONG TERM GOAL #4   Title  The patient will have improved left ankle/foot strength grossly 4/5  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    Time  8    Period  Weeks    Status  New      PT LONG TERM GOAL #5   Title  The patient will be able to single leg stand at least 5 sec    Time  8    Period  Weeks    Status  New      Additional Long Term Goals   Additional Long Term Goals  Yes      PT LONG TERM GOAL #6   Title  FOTO functional outcome score improved from 52% limitation to 37% indicating improved function with less pain    Time  8    Period  Weeks    Status  New            Plan - 05/01/19 1415    Clinical Impression Statement  The patient has myofascial tender points in gastroc muscle belly which responds well to instrument assisted soft tissue work.  He may benefit from DN to gastroc in the future if MD approves.   Improving joint mobility as well.  He is able to add resistance to several exercises with pain level remaining at the 2/10 level.  Verbal cues to position LE in straight ahead direction rather than compensating with excessive toe out.  Hold on vasocompression today in hopes of maintaining new mobility, decreasing complaints of stiffness.    PT Frequency  2x / week    PT Duration  8 weeks  PT Treatment/Interventions  ADLs/Self Care Home Management;Cryotherapy;Electrical Stimulation;Iontophoresis 4mg /ml Dexamethasone;Therapeutic activities;Therapeutic exercise;Neuromuscular re-education;Manual techniques;Taping;Patient/family education;Ultrasound;Dry needling;Moist Heat    PT Next Visit Plan  send MD note next visit;    recheck ROM, girth measurements;  slow progression of strengthening;  DN in future if MD approves    PT Home Exercise Plan  Access Code: [email or web address]       Patient will benefit from skilled therapeutic intervention in order to improve the following deficits and impairments:  Abnormal gait, Pain, Decreased range of motion, Decreased activity tolerance, Impaired perceived functional ability, Decreased strength, Increased edema  Visit  Diagnosis: Pain in left ankle and joints of left foot  Stiffness of left ankle, not elsewhere classified  Localized edema  Muscle weakness (generalized)     Problem List Patient Active Problem List   Diagnosis Date Noted  . Low back pain 06/23/2018  . Depression, major, single episode, moderate ([place]) 06/02/2018  . Vitamin D deficiency 06/02/2018   Ruben Im, PT 05/01/19 2:23 PM Phone: [phone] Fax: [phone] Alvera Singh 05/01/2019, 2:23 PM  [facility] [address] [place], [place], [address] Phone: [phone]   Fax:  [phone]  Name: Adam Mcdonald MRN: [id number] Date of Birth: September 22, 1965

## 2019-05-08 ENCOUNTER — Other Ambulatory Visit: Payer: Self-pay

## 2019-05-08 ENCOUNTER — Ambulatory Visit: Payer: BC Managed Care – PPO | Attending: Podiatry | Admitting: Physical Therapy

## 2019-05-08 ENCOUNTER — Encounter: Payer: Self-pay | Admitting: Physical Therapy

## 2019-05-08 DIAGNOSIS — M6281 Muscle weakness (generalized): Secondary | ICD-10-CM | POA: Diagnosis not present

## 2019-05-08 DIAGNOSIS — R600 Localized edema: Secondary | ICD-10-CM | POA: Insufficient documentation

## 2019-05-08 DIAGNOSIS — M25572 Pain in left ankle and joints of left foot: Secondary | ICD-10-CM | POA: Diagnosis not present

## 2019-05-08 DIAGNOSIS — M25672 Stiffness of left ankle, not elsewhere classified: Secondary | ICD-10-CM | POA: Insufficient documentation

## 2019-05-08 NOTE — Therapy (Signed)
[facility] [address] [place], [place], [address] Phone: [phone]   Fax:  [phone]  Physical Therapy Treatment  Patient Details  Name: Adam Mcdonald MRN: [id number] Date of Birth: 1965/09/03 Referring Provider (PT): Dr. Tyson Dense    Encounter Date: 05/08/2019  PT End of Session - 05/08/19 0937    Visit Number  6    Number of Visits  36    Date for PT Re-Evaluation  06/12/19    Authorization Type  BCBS  Had previous PT 36 visits left    PT Start Time  [phone]    PT Stop Time  0930    PT Time Calculation (min)  48 min    Activity Tolerance  Patient tolerated treatment well       Past Medical History:  Diagnosis Date  . Chicken pox   . Depression   . History of MRSA infection   . Sleep apnea   . Ventral hernia without obstruction or gangrene   . Vitamin D deficiency     Past Surgical History:  Procedure Laterality Date  . UVULOPALATOPHARYNGOPLASTY      There were no vitals filed for this visit.  Subjective Assessment - 05/08/19 0845    Subjective  It was fine without doing the Game Ready last time.  Sees the doctor on Thursday.  Concerned about climbing ladders, walking down stairs without using a handrail and carrying big loads at work.  I did too much yesterday.  I tried to do goblet squats with heels raised but then it started hurting too much.  Can't walk down steps reciprocally.  Overall the swelling is better until yesterday.  Tight in my calf.    Pertinent History  Surgery 02/16/19;  MD 12/3    Currently in Pain?  Yes    Pain Score  3     Pain Orientation  Left    Pain Type  Acute pain    Aggravating Factors   rising after sitting a while; descending steps         [facility] PT Assessment - 05/08/19 0001      Figure 8 Edema   Figure 8 - Left   59 cm      AROM   Overall AROM Comments  9 sec on left     Left Ankle Dorsiflexion  10    Left Ankle Plantar Flexion  50    Left Ankle Inversion   18    Left Ankle Eversion  22      Strength   Overall Strength Comments  1x bil heel raise very painful     Left Ankle Dorsiflexion  3+/5    Left Ankle Plantar Flexion  3/5    Left Ankle Inversion  3+/5    Left Ankle Eversion  3+/5                   [facility] Adult PT Treatment/Exercise - 05/08/19 0001      Knee/Hip Exercises: Standing   Heel Raises Limitations  1x bil very painful     Forward Step Up  Left;1 set;10 reps;Hand Hold: 2;Step Height: 6"    Step Down  Left;1 set;5 reps;Hand Hold: 1;Step Height: 2"    Step Down Limitations  also 5x no step     Other Standing Knee Exercises  discussed partial squats vs/ goblet squats       Manual Therapy   Joint Mobilization  grade 2 metatarsal, talocrual mobs  20 sec 5x    Soft tissue mobilization  gastroc/soleus, anterior tib, peri-incisional ;  instrument assissted to gastroc, Achilles, peroneals       Ankle Exercises: Machines for Strengthening   Cybex Leg Press  calf press 30# bil 20x       Ankle Exercises: Aerobic   Recumbent Bike  L1 x6 min, PT present to discuss progress      Ankle Exercises: Standing   SLS  3x                PT Short Term Goals - 05/08/19 0960      PT SHORT TERM GOAL #1   Title  The patient will demonstrate knowledge of basic self care including edema control strategies, ROM, low level strengthening    Status  Achieved      PT SHORT TERM GOAL #2   Title  The patient will be able to walk in the grocery store with pain level 5/10 or less    Time  4    Period  Weeks    Status  Achieved      PT SHORT TERM GOAL #3   Title  The patient will have improved left ankle DF to 4 degrees, PF to 50 degrees, Inversion and eversion to 18 degrees    Status  Achieved      PT SHORT TERM GOAL #4   Title  The patient will have decreased figure 8 girth measurement on left to 59 cm    Status  Achieved        PT Long Term Goals - 04/17/19 1914      PT LONG TERM GOAL #1   Title  The patient will  be independent with safe self progression of HEP    Time  8    Period  Weeks    Status  New    Target Date  06/12/19      PT LONG TERM GOAL #2   Title  The patient will be able to walk 20 minutes with pain level 4/10 or less    Time  8    Period  Weeks    Status  New      PT LONG TERM GOAL #3   Title  The patient will have improved ankle DF to 8 degrees and PF to 55 degrees needed for greater ease ascending/descending steps at this apartment    Time  8    Period  Weeks    Status  New      PT LONG TERM GOAL #4   Title  The patient will have improved left ankle/foot strength 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  Strength deficits persist 3 to 3+/5 throughout.  Bilateral heel raises very painful as well as descending steps.  He is unable to descend steps reciprocally.    Initiated seated low load calf presses but right LE doing the majority of the work.  On initial visit he was unable to single leg stand and now able to hold 9 sec.  Decreased edema girth measurements by 2 cm.  He is concerned about returning to work in December given the physical aspects of his job.  He may need to modifications including lifting limitations and avoidance of climbing ladders for safety reasons or shortened work hours if possible.  Will continue with PT for progressive  strengthening, ROM and manual therapy.  He may benefit from DN to lower leg muscles and plans on discussing with his doctor.  All STGs met.    Stability/Clinical Decision Making  Stable/Uncomplicated    Rehab Potential  Good    PT Frequency  2x / week    PT Duration  8 weeks    PT Treatment/Interventions  ADLs/Self Care Home Management;Cryotherapy;Electrical Stimulation;Iontophoresis 49m/ml Dexamethasone;Therapeutic activities;Therapeutic exercise;Neuromuscular re-education;Manual techniques;Taping;Patient/family education;Ultrasound;Dry needling;Moist Heat    PT Next Visit Plan  see how MD appt went;  calf press, resisted walking, 2 inch step downs. soft tissue and joint mobs;  dry needling if MD approves    PT Home Exercise Plan  Access Code: [email or web address]       Patient will benefit from skilled therapeutic intervention in order to improve the following deficits and impairments:  Abnormal gait, Pain, Decreased range of motion, Decreased activity tolerance, Impaired perceived functional ability, Decreased strength, Increased edema  Visit Diagnosis: Pain in left ankle and joints of left foot  Stiffness of left ankle, not elsewhere classified  Localized edema  Muscle weakness (generalized)     Problem List Patient Active Problem List   Diagnosis Date Noted  . Low back pain 06/23/2018  . Depression, major, single episode, moderate ([place] 06/02/2018  . Vitamin D deficiency 06/02/2018   SRuben Im PT 05/08/19 9:55 AM Phone: [phone]: [phone] Singh12/06/2018, 9:55 AM  [facility] [address] [place] [address]: [phone]  Fax:  [phone] Name: DCrews MccollamMRN: [id number] of Birth: 629-Mar-1967

## 2019-05-10 ENCOUNTER — Encounter: Payer: Self-pay | Admitting: Podiatry

## 2019-05-10 ENCOUNTER — Ambulatory Visit (INDEPENDENT_AMBULATORY_CARE_PROVIDER_SITE_OTHER): Payer: BC Managed Care – PPO | Admitting: Podiatry

## 2019-05-10 ENCOUNTER — Other Ambulatory Visit: Payer: Self-pay

## 2019-05-10 DIAGNOSIS — Z9889 Other specified postprocedural states: Secondary | ICD-10-CM

## 2019-05-11 ENCOUNTER — Encounter: Payer: Self-pay | Admitting: Physical Therapy

## 2019-05-11 ENCOUNTER — Ambulatory Visit: Payer: BC Managed Care – PPO | Admitting: Physical Therapy

## 2019-05-11 DIAGNOSIS — M25672 Stiffness of left ankle, not elsewhere classified: Secondary | ICD-10-CM

## 2019-05-11 DIAGNOSIS — R600 Localized edema: Secondary | ICD-10-CM | POA: Diagnosis not present

## 2019-05-11 DIAGNOSIS — M25572 Pain in left ankle and joints of left foot: Secondary | ICD-10-CM

## 2019-05-11 DIAGNOSIS — M6281 Muscle weakness (generalized): Secondary | ICD-10-CM

## 2019-05-11 NOTE — Therapy (Signed)
[facility] [address], [place], [place], [address] Phone: [phone]   Fax:  [phone]  Physical Therapy Treatment  Patient Details  Name: Adam Mcdonald MRN: [id number] Date of Birth: May 20, 1966 Referring Provider (PT): Dr. Tyson Dense    Encounter Date: 05/11/2019  PT End of Session - 05/11/19 1134    Visit Number  7    Number of Visits  36    Date for PT Re-Evaluation  06/12/19    Authorization Type  BCBS  Had previous PT 36 visits left    PT Start Time  0845    PT Stop Time  0930    PT Time Calculation (min)  45 min    Activity Tolerance  Patient tolerated treatment well       Past Medical History:  Diagnosis Date  . Chicken pox   . Depression   . History of MRSA infection   . Sleep apnea   . Ventral hernia without obstruction or gangrene   . Vitamin D deficiency     Past Surgical History:  Procedure Laterality Date  . UVULOPALATOPHARYNGOPLASTY      There were no vitals filed for this visit.  Subjective Assessment - 05/11/19 0846    Subjective  Going back to work 12/14 with work modifications and restrictions.  The doctor was really impressed with progress and he said to push it as much as possible, "no chance of long term inflammation." He said to "start pushing it." The pain is really good today.    Pertinent History  Surgery 02/16/19;  MD January    Currently in Pain?  Yes    Pain Score  1     Pain Location  Leg                       [facility] Adult PT Treatment/Exercise - 05/11/19 0001      Knee/Hip Exercises: Standing   Step Down  Left;1 set;10 reps;Hand Hold: 1;Step Height: 2"    Step Down Limitations  also 10 x no step       Manual Therapy   Manual therapy comments  gentle scar massage instrument assisted     Joint Mobilization  grade 2 metatarsal, talocrual mobs 20 sec 5x    Soft tissue mobilization  gastroc/soleus, anterior tib, peri-incisional ;  instrument assissted  to gastroc, Achilles, peroneals       Ankle Exercises: Machines for Strengthening   Cybex Leg Press  calf press 30# bil 10x and single leg press 30# 10x       Ankle Exercises: Standing   SLS  WB on left with right floor slider forward, side, back and circles 5x each     Rocker Board  2 minutes      Ankle Exercises: Aerobic   Recumbent Bike  L1 x6 min, PT present to discuss progress               PT Short Term Goals - 05/08/19 [id number]      PT SHORT TERM GOAL #1   Title  The patient will demonstrate knowledge of basic self care including edema control strategies, ROM, low level strengthening    Status  Achieved      PT SHORT TERM GOAL #2   Title  The patient will be able to walk in the grocery store with pain level 5/10 or less    Time  4    Period  Weeks  Status  Achieved      PT SHORT TERM GOAL #3   Title  The patient will have improved left ankle DF to 4 degrees, PF to 50 degrees, Inversion and eversion to 18 degrees    Status  Achieved      PT SHORT TERM GOAL #4   Title  The patient will have decreased figure 8 girth measurement on left to 59 cm    Status  Achieved        PT Long Term Goals - 04/17/19 1914      PT LONG TERM GOAL #1   Title  The patient will be independent with safe self progression of HEP    Time  8    Period  Weeks    Status  New    Target Date  06/12/19      PT LONG TERM GOAL #2   Title  The patient will be able to walk 20 minutes with pain level 4/10 or less    Time  8    Period  Weeks    Status  New      PT LONG TERM GOAL #3   Title  The patient will have improved ankle DF to 8 degrees and PF to 55 degrees needed for greater ease ascending/descending steps at this apartment    Time  8    Period  Weeks    Status  New      PT LONG TERM GOAL #4   Title  The patient will have improved left ankle/foot strength 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    Time  8    Period  Weeks    Status  New      PT  LONG TERM GOAL #5   Title  The patient will be able to single leg stand at least 5 sec    Time  8    Period  Weeks    Status  New      Additional Long Term Goals   Additional Long Term Goals  Yes      PT LONG TERM GOAL #6   Title  FOTO functional outcome score improved from 52% limitation to 37% indicating improved function with less pain    Time  8    Period  Weeks    Status  New            Plan - 05/11/19 1135    Clinical Impression Statement  The patient is able to perform a progression of ROM and strengthening exercises with reports of increases in pain level to a 2 or 3/10 level.  He states he is willing to "push it" a bit more since the doctor encouraged it.  Improving soft tissue mobility with no tenderpoints in gastroc but with mild to moderate scar thickening on distal incision.  Discussed return to work in another week and physical limitations.  Recommend continued PT for 4 more weeks to help his return be successful.    Rehab Potential  Good    PT Frequency  2x / week    PT Duration  8 weeks    PT Treatment/Interventions  ADLs/Self Care Home Management;Cryotherapy;Electrical Stimulation;Iontophoresis 4mg /ml Dexamethasone;Therapeutic activities;Therapeutic exercise;Neuromuscular re-education;Manual techniques;Taping;Patient/family education;Ultrasound;Dry needling;Moist Heat    PT Next Visit Plan  standing rocker board; single leg press;  calf press, resisted walking, 2 inch step downs. soft tissue and joint mobs;  dry needling if needed    PT Home Exercise Plan  Access Code: [email or web address]       Patient will benefit from skilled therapeutic intervention in order to improve the following deficits and impairments:  Abnormal gait, Pain, Decreased range of motion, Decreased activity tolerance, Impaired perceived functional ability, Decreased strength, Increased edema  Visit Diagnosis: Pain in left ankle and joints of left foot  Stiffness of left ankle, not elsewhere  classified  Localized edema  Muscle weakness (generalized)     Problem List Patient Active Problem List   Diagnosis Date Noted  . Low back pain 06/23/2018  . Depression, major, single episode, moderate ([place]) 06/02/2018  . Vitamin D deficiency 06/02/2018   Ruben Im, PT 05/11/19 11:44 AM Phone: [phone] Fax: [phone] Alvera Singh 05/11/2019, 11:43 AM  [facility] [address], [place], [place], [address] Phone: [phone]   Fax:  [phone]  Name: Adam Mcdonald MRN: [id number] Date of Birth: [birth year]

## 2019-05-12 NOTE — Progress Notes (Signed)
He presents today for postop visit date of surgery February 16, 2019 gastroc recession retrocalcaneal heel spur resection Achilles tenolysis and manipulation of ankle states that he is doing much better still has pain but is happy to be walking now.  States that he still cannot go up on his toes and he still has to take 1 step at a time.  He denies calf pain back pain chest pain shortness of breath.  Objective: Vital signs are stable he is alert and oriented x3.  Pulses are palpable.  Surgical site is well-healed.  Minimal edema.  No erythema cellulitis drainage or odor no open lesions or wounds.  He has good plantar flexion against resistance margins of the Achilles is intact  Assessment: Well-healing surgical foot leg.  Plan: He has been to continue physical therapy he will return to clinic in 1 month we will reassess then and evaluate his work conditions once again at that time.  Otherwise he will go back to work with some restrictions.

## 2019-05-15 ENCOUNTER — Encounter: Payer: Self-pay | Admitting: Physical Therapy

## 2019-05-15 ENCOUNTER — Other Ambulatory Visit: Payer: Self-pay

## 2019-05-15 ENCOUNTER — Ambulatory Visit: Payer: BC Managed Care – PPO | Admitting: Physical Therapy

## 2019-05-15 DIAGNOSIS — M25672 Stiffness of left ankle, not elsewhere classified: Secondary | ICD-10-CM | POA: Diagnosis not present

## 2019-05-15 DIAGNOSIS — M25572 Pain in left ankle and joints of left foot: Secondary | ICD-10-CM

## 2019-05-15 DIAGNOSIS — R600 Localized edema: Secondary | ICD-10-CM

## 2019-05-15 DIAGNOSIS — M6281 Muscle weakness (generalized): Secondary | ICD-10-CM | POA: Diagnosis not present

## 2019-05-15 NOTE — Therapy (Signed)
[facility] [address] [place], [place], [address] Phone: [phone]   Fax:  [phone]  Physical Therapy Treatment  Patient Details  Name: Adam Mcdonald MRN: [id number] Date of Birth: 06/06/1966 Referring Provider (PT): Dr. Tyson Dense    Encounter Date: 05/15/2019  PT End of Session - 05/15/19 0851    Visit Number  8    Number of Visits  36    Date for PT Re-Evaluation  06/12/19    Authorization Type  BCBS  Had previous PT 36 visits left    PT Start Time  0801    PT Stop Time  0846    PT Time Calculation (min)  45 min    Activity Tolerance  Patient tolerated treatment well       Past Medical History:  Diagnosis Date  . Chicken pox   . Depression   . History of MRSA infection   . Sleep apnea   . Ventral hernia without obstruction or gangrene   . Vitamin D deficiency     Past Surgical History:  Procedure Laterality Date  . UVULOPALATOPHARYNGOPLASTY      There were no vitals filed for this visit.  Subjective Assessment - 05/15/19 0804    Subjective  Pretty good weekend.  Went to [employer] and walked on Sunday, after 20 minutes area it hurt in the Achilles area.  Got to a 5/10.  It felt like tissue was caught.    Yesterday still sore but did 2 sets of 10 with 25#.  Pain still elevated today.    Pertinent History  Surgery 02/16/19;  MD January    Currently in Pain?  Yes    Pain Score  2     Pain Location  Foot    Pain Orientation  Left    Pain Type  Acute pain                       [facility] Adult PT Treatment/Exercise - 05/15/19 0001      Knee/Hip Exercises: Standing   Other Standing Knee Exercises  wall pull aways 10x       Manual Therapy   Manual therapy comments  gentle scar massage instrument assisted     Joint Mobilization  grade 2 metatarsal, talocrual mobs 20 sec 5x    Soft tissue mobilization  gastroc/soleus, anterior tib, peri-incisional ;  instrument assissted to gastroc,  Achilles, peroneals       Ankle Exercises: Standing   BAPS  Standing;Level 2;15 reps   PF/DF, clock and counter directions 10x each    SLS  red band 4 ways 10x each     Rocker Board  --   10x     Ankle Exercises: Aerobic   Recumbent Bike  L1 x6 min, PT present to discuss progress      Ankle Exercises: Machines for Strengthening   Cybex Leg Press  seat 9 single leg calf press 10x       Ankle Exercises: Stretches   Gastroc Stretch  5 reps   in doorway              PT Short Term Goals - 05/08/19 0951      PT SHORT TERM GOAL #1   Title  The patient will demonstrate knowledge of basic self care including edema control strategies, ROM, low level strengthening    Status  Achieved      PT SHORT TERM GOAL #2  Title  The patient will be able to walk in the grocery store with pain level 5/10 or less    Time  4    Period  Weeks    Status  Achieved      PT SHORT TERM GOAL #3   Title  The patient will have improved left ankle DF to 4 degrees, PF to 50 degrees, Inversion and eversion to 18 degrees    Status  Achieved      PT SHORT TERM GOAL #4   Title  The patient will have decreased figure 8 girth measurement on left to 59 cm    Status  Achieved        PT Long Term Goals - 04/17/19 1914      PT LONG TERM GOAL #1   Title  The patient will be independent with safe self progression of HEP    Time  8    Period  Weeks    Status  New    Target Date  06/12/19      PT LONG TERM GOAL #2   Title  The patient will be able to walk 20 minutes with pain level 4/10 or less    Time  8    Period  Weeks    Status  New      PT LONG TERM GOAL #3   Title  The patient will have improved ankle DF to 8 degrees and PF to 55 degrees needed for greater ease ascending/descending steps at this apartment    Time  8    Period  Weeks    Status  New      PT LONG TERM GOAL #4   Title  The patient will have improved left ankle/foot strength grossly 4/5 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   Verbal cues to avoid compensatory movements during exercises.  Tends to hyperextend knee with single leg standing.  He needs UE support with single leg standing ex's with contra leg movments. He is able to perform single leg calf press for the first time today.  Therapist closely monitoring pain to ensure level does not exceed 5/10.    Rehab Potential  Good    PT Frequency  2x / week    PT Duration  8 weeks    PT Treatment/Interventions  ADLs/Self Care Home Management;Cryotherapy;Electrical Stimulation;Iontophoresis 4mg /ml Dexamethasone;Therapeutic activities;Therapeutic exercise;Neuromuscular re-education;Manual techniques;Taping;Patient/family education;Ultrasound;Dry needling;Moist Heat    PT Next Visit Plan  single leg press;  calf press, resisted walking, 2 inch step downs. soft tissue  and joint mobs;  dry needling if needed    PT Home Exercise Plan  Access Code: [email or web address]       Patient will benefit from skilled therapeutic intervention in order to improve the following deficits and impairments:  Abnormal gait, Pain, Decreased range of motion, Decreased activity tolerance, Impaired perceived functional ability, Decreased strength, Increased edema  Visit Diagnosis: Pain in left ankle and joints of left foot  Stiffness of left ankle, not elsewhere classified  Localized  edema  Muscle weakness (generalized)     Problem List Patient Active Problem List   Diagnosis Date Noted  . Low back pain 06/23/2018  . Depression, major, single episode, moderate ([place]) 06/02/2018  . Vitamin D deficiency 06/02/2018   Ruben Im, PT 05/15/19 8:56 AM Phone: [phone] Fax: [phone] Alvera Singh 05/15/2019, 8:56 AM  [facility] [address] [place], [place], [address] Phone: [phone]   Fax:  [phone]  Name: Adam Mcdonald MRN: [id number] Date of Birth: Jul 25, 1965

## 2019-05-17 ENCOUNTER — Ambulatory Visit: Payer: BC Managed Care – PPO | Admitting: Physical Therapy

## 2019-05-17 ENCOUNTER — Other Ambulatory Visit: Payer: Self-pay

## 2019-05-17 DIAGNOSIS — M6281 Muscle weakness (generalized): Secondary | ICD-10-CM

## 2019-05-17 DIAGNOSIS — R600 Localized edema: Secondary | ICD-10-CM | POA: Diagnosis not present

## 2019-05-17 DIAGNOSIS — M25572 Pain in left ankle and joints of left foot: Secondary | ICD-10-CM

## 2019-05-17 DIAGNOSIS — M25672 Stiffness of left ankle, not elsewhere classified: Secondary | ICD-10-CM | POA: Diagnosis not present

## 2019-05-17 NOTE — Therapy (Signed)
[facility] [address] [place], [place], [address] Phone: [phone]   Fax:  [phone]  Physical Therapy Treatment  Patient Details  Name: Adam Mcdonald MRN: [id number] Date of Birth: 23-Jun-1965 Referring Provider (PT): Dr. Tyson Dense    Encounter Date: 05/17/2019  PT End of Session - 05/17/19 1907    Visit Number  9    Number of Visits  36    Date for PT Re-Evaluation  06/12/19    Authorization Type  BCBS  Had previous PT 36 visits left    PT Start Time  0802    PT Stop Time  0844    PT Time Calculation (min)  42 min    Activity Tolerance  Patient tolerated treatment well       Past Medical History:  Diagnosis Date  . Chicken pox   . Depression   . History of MRSA infection   . Sleep apnea   . Ventral hernia without obstruction or gangrene   . Vitamin D deficiency     Past Surgical History:  Procedure Laterality Date  . UVULOPALATOPHARYNGOPLASTY      There were no vitals filed for this visit.  Subjective Assessment - 05/17/19 0808    Subjective  Walked for 45 minutes yesterday b//c I got lost on the trail.  Surprisingly it's not too bad today.  I'm walking OK today.    Pertinent History  Surgery 02/16/19;  MD January    Currently in Pain?  Yes    Pain Score  1     Pain Location  Ankle                       [facility] Adult PT Treatment/Exercise - 05/17/19 0001      Knee/Hip Exercises: Standing   Step Down  Left;10 reps;Hand Hold: 1;Step Height: 4"    Walking with Sports Cord  30# backward; 25# forward 5x each       Manual Therapy   Manual therapy comments  gentle scar massage instrument assisted     Joint Mobilization  grade 2 metatarsal, talocrual mobs 20 sec 5x    Soft tissue mobilization  gastroc/soleus, anterior tib, peri-incisional ;  instrument assissted to gastroc, Achilles, peroneals       Ankle Exercises: Aerobic   Recumbent Bike  L1 x6 min, PT present to discuss  progress      Ankle Exercises: Machines for Strengthening   Cybex Leg Press  seat 9 single leg calf press 10x  25#       Ankle Exercises: Stretches   Gastroc Stretch  --   on 2nd step      Ankle Exercises: Standing   Other Standing Ankle Exercises  standing on black foam with  4 way touches                PT Short Term Goals - 05/08/19 0951      PT SHORT TERM GOAL #1   Title  The patient will demonstrate knowledge of basic self care including edema control strategies, ROM, low level strengthening    Status  Achieved      PT SHORT TERM GOAL #2   Title  The patient will be able to walk in the grocery store with pain level 5/10 or less    Time  4    Period  Weeks    Status  Achieved      PT SHORT TERM GOAL #  3   Title  The patient will have improved left ankle DF to 4 degrees, PF to 50 degrees, Inversion and eversion to 18 degrees    Status  Achieved      PT SHORT TERM GOAL #4   Title  The patient will have decreased figure 8 girth measurement on left to 59 cm    Status  Achieved        PT Long Term Goals - 04/17/19 1914      PT LONG TERM GOAL #1   Title  The patient will be independent with safe self progression of HEP    Time  8    Period  Weeks    Status  New    Target Date  06/12/19      PT LONG TERM GOAL #2   Title  The patient will be able to walk 20 minutes with pain level 4/10 or less    Time  8    Period  Weeks    Status  New      PT LONG TERM GOAL #3   Title  The patient will have improved ankle DF to 8 degrees and PF to 55 degrees needed for greater ease ascending/descending steps at this apartment    Time  8    Period  Weeks    Status  New      PT LONG TERM GOAL #4   Title  The patient will have improved left ankle/foot strength 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    Time  8    Period  Weeks    Status  New      PT LONG TERM GOAL #5   Title  The patient will be able to single leg stand at least 5 sec     Time  8    Period  Weeks    Status  New      Additional Long Term Goals   Additional Long Term Goals  Yes      PT LONG TERM GOAL #6   Title  FOTO functional outcome score improved from 52% limitation to 37% indicating improved function with less pain    Time  8    Period  Weeks    Status  New            Plan - 05/17/19 1910    Clinical Impression Statement  Despite patient's report of longer distance walking, he has no increase in edema or adverse changes in soft tissue mobility.  He is able to participate in a progression of LE strengthening including a progress from 2 inch step downs to 4 inch step downs.  Therapist providing cues for heel toe pattern during ambulation and resisted walking.  Also monitoring response with all treatment interventions.  Patient returning to work  on Monday with modifications.    Rehab Potential  Good    PT Frequency  2x / week    PT Duration  8 weeks    PT Treatment/Interventions  ADLs/Self Care Home Management;Cryotherapy;Electrical Stimulation;Iontophoresis 4mg /ml Dexamethasone;Therapeutic activities;Therapeutic exercise;Neuromuscular re-education;Manual techniques;Taping;Patient/family education;Ultrasound;Dry needling;Moist Heat    PT Next Visit Plan  assess response with return to work on Monday;  single leg press; standing BAPS,  calf press, resisted walking, 4 inch step downs. soft tissue and joint mobs [place]    PT Home Exercise Plan  Access Code: [email or web address]       Patient will benefit from skilled therapeutic intervention in order to  improve the following deficits and impairments:  Abnormal gait, Pain, Decreased range of motion, Decreased activity tolerance, Impaired perceived functional ability, Decreased strength, Increased edema  Visit Diagnosis: Pain in left ankle and joints of left foot  Stiffness of left ankle, not elsewhere classified  Localized edema  Muscle weakness (generalized)     Problem List Patient Active Problem  List   Diagnosis Date Noted  . Low back pain 06/23/2018  . Depression, major, single episode, moderate ([place]) 06/02/2018  . Vitamin D deficiency 06/02/2018   Adam Mcdonald, PT 05/17/19 7:26 PM Phone: [phone] Fax: [phone] Adam Mcdonald 05/17/2019, 7:26 PM  [facility] [address], [place], [place], [address] Phone: [phone]   Fax:  [phone]  Name: Adam Mcdonald MRN: [id number] Date of Birth: December 20, 1965

## 2019-05-24 ENCOUNTER — Ambulatory Visit: Payer: BC Managed Care – PPO | Admitting: Physical Therapy

## 2019-05-24 ENCOUNTER — Other Ambulatory Visit: Payer: Self-pay

## 2019-05-24 DIAGNOSIS — R600 Localized edema: Secondary | ICD-10-CM | POA: Diagnosis not present

## 2019-05-24 DIAGNOSIS — M25672 Stiffness of left ankle, not elsewhere classified: Secondary | ICD-10-CM

## 2019-05-24 DIAGNOSIS — M25572 Pain in left ankle and joints of left foot: Secondary | ICD-10-CM | POA: Diagnosis not present

## 2019-05-24 DIAGNOSIS — M6281 Muscle weakness (generalized): Secondary | ICD-10-CM | POA: Diagnosis not present

## 2019-05-24 NOTE — Therapy (Signed)
[facility] [address] [place], [place], [address] Phone: [phone]   Fax:  [phone]  Physical Therapy Treatment  Patient Details  Name: Adam Mcdonald MRN: [id number] Date of Birth: May 05, 1966 Referring Provider (PT): Dr. Tyson Dense    Encounter Date: 05/24/2019  PT End of Session - 05/24/19 1650    Visit Number  10    Number of Visits  36    Date for PT Re-Evaluation  06/12/19    Authorization Type  BCBS  Had previous PT 36 visits left    PT Start Time  1531    PT Stop Time  1615    PT Time Calculation (min)  44 min    Activity Tolerance  Patient tolerated treatment well       Past Medical History:  Diagnosis Date  . Chicken pox   . Depression   . History of MRSA infection   . Sleep apnea   . Ventral hernia without obstruction or gangrene   . Vitamin D deficiency     Past Surgical History:  Procedure Laterality Date  . UVULOPALATOPHARYNGOPLASTY      There were no vitals filed for this visit.  Subjective Assessment - 05/24/19 1533    Subjective  Returned to work this week.  Better than expected but I'm in constant pain.  Lots of walking.  I can walk down stairs now if slow and careful.  Pain is in Achilles.  Evenings, after work, 4/10.    Pertinent History  Surgery 02/16/19;  MD January    Patient Stated Goals  ROM, normal walking gait;  I walk slower now with short steps;  walk down steps normally without crab walking;  return to weight lifting goblet squat, dead lifts, TRX has home    Currently in Pain?  Yes    Pain Score  3     Pain Location  Ankle    Pain Orientation  Left    Pain Type  Acute pain                       [facility] Adult PT Treatment/Exercise - 05/24/19 0001      Manual Therapy   Manual therapy comments  gentle scar massage instrument assisted     Joint Mobilization  grade 3 metatarsal, talocrual mobs 20 sec 5x    Soft tissue mobilization  gastroc/soleus,  anterior tib, peri-incisional ;  instrument assissted to gastroc, Achilles, peroneals in sidelying and with weightbearing dynamically       Ankle Exercises: Aerobic   Recumbent Bike  L1 x5 min, PT present to discuss progress      Ankle Exercises: Standing   SLS  SLS on left with 4 ways vectors with eyes open and closed     Rocker Board  2 minutes    Rebounder  SLS on left, right on BOSU with red ball toss 20x     Other Standing Ankle Exercises  ladder walk high step, side stepping and in/out box step 2 laps each     Other Standing Ankle Exercises  step up on 2 inch foam with right step taps 15x       Ankle Exercises: Machines for Strengthening   Cybex Leg Press  seat 6 single leg calf press 20x  25# assisted with right to press, eccentric return with left only                PT Short Term  Goals - 05/08/19 0951      PT SHORT TERM GOAL #1   Title  The patient will demonstrate knowledge of basic self care including edema control strategies, ROM, low level strengthening    Status  Achieved      PT SHORT TERM GOAL #2   Title  The patient will be able to walk in the grocery store with pain level 5/10 or less    Time  4    Period  Weeks    Status  Achieved      PT SHORT TERM GOAL #3   Title  The patient will have improved left ankle DF to 4 degrees, PF to 50 degrees, Inversion and eversion to 18 degrees    Status  Achieved      PT SHORT TERM GOAL #4   Title  The patient will have decreased figure 8 girth measurement on left to 59 cm    Status  Achieved        PT Long Term Goals - 04/17/19 1914      PT LONG TERM GOAL #1   Title  The patient will be independent with safe self progression of HEP    Time  8    Period  Weeks    Status  New    Target Date  06/12/19      PT LONG TERM GOAL #2   Title  The patient will be able to walk 20 minutes with pain level 4/10 or less    Time  8    Period  Weeks    Status  New      PT LONG TERM GOAL #3   Title  The patient will  have improved ankle DF to 8 degrees and PF to 55 degrees needed for greater ease ascending/descending steps at this apartment    Time  8    Period  Weeks    Status  New      PT LONG TERM GOAL #4   Title  The patient will have improved left ankle/foot strength 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  Although his pain level has increased, overall he was pleased with his ability to do a lot of walking and descend steps a little easier.  Mild increase in ankle edema and myofascial changes around incisons but much improved following manual therapy.  He is able to perform moderate proprioceptive challenges but needs intermittent use of UEs for stability.  Therapist monitoring response and modifying as needed for pain.  Pain level <4/10.  Stability/Clinical Decision Making  Stable/Uncomplicated    Rehab Potential  Good    PT Frequency  2x / week    PT Duration  8 weeks    PT Treatment/Interventions  ADLs/Self Care Home Management;Cryotherapy;Electrical Stimulation;Iontophoresis 4mg /ml Dexamethasone;Therapeutic activities;Therapeutic exercise;Neuromuscular re-education;Manual techniques;Taping;Patient/family education;Ultrasound;Dry needling;Moist Heat    PT Next Visit Plan  proprioception; single leg press; standing BAPS,  calf press, resisted walking, 4 inch step downs. soft tissue and joint mobs  [place]    PT Home Exercise Plan  Access Code: [email or web address]       Patient will benefit from skilled therapeutic intervention in order to improve the following deficits and impairments:  Abnormal gait, Pain, Decreased range of motion, Decreased activity tolerance, Impaired perceived functional ability, Decreased strength, Increased edema  Visit Diagnosis: Pain in left ankle and joints of left foot  Stiffness of left ankle, not elsewhere classified  Localized edema  Muscle weakness (generalized)     Problem List Patient Active Problem List   Diagnosis Date Noted  . Low back pain 06/23/2018  . Depression, major, single episode, moderate ([place]) 06/02/2018  . Vitamin D deficiency 06/02/2018   Ruben Im, PT 05/24/19 4:57 PM Phone: [phone] Fax: [phone] Alvera Singh 05/24/2019, 4:56 PM  [facility] [address], [place], [place], [address] Phone: [phone]   Fax:  [phone]  Name: Adam Mcdonald MRN: [id number] Date of Birth: 03-11-1966

## 2019-05-30 ENCOUNTER — Encounter

## 2019-06-05 ENCOUNTER — Other Ambulatory Visit: Payer: Self-pay

## 2019-06-05 ENCOUNTER — Encounter: Payer: Self-pay | Admitting: Physical Therapy

## 2019-06-05 ENCOUNTER — Ambulatory Visit: Payer: BC Managed Care – PPO | Admitting: Physical Therapy

## 2019-06-05 DIAGNOSIS — M6281 Muscle weakness (generalized): Secondary | ICD-10-CM | POA: Diagnosis not present

## 2019-06-05 DIAGNOSIS — M25572 Pain in left ankle and joints of left foot: Secondary | ICD-10-CM

## 2019-06-05 DIAGNOSIS — M25672 Stiffness of left ankle, not elsewhere classified: Secondary | ICD-10-CM | POA: Diagnosis not present

## 2019-06-05 DIAGNOSIS — R600 Localized edema: Secondary | ICD-10-CM | POA: Diagnosis not present

## 2019-06-05 NOTE — Patient Instructions (Signed)
Access Code: [email or web address]  URL: [email or web address]  Date: 06/05/2019  Prepared by: Sherol Dade    Exercises Long Sitting Ankle Plantar Flexion with Resistance- 10-20 reps- 2 sets- 1x daily- 4x weekly  CLX Ankle Dorsiflexion and Eversion- 20 reps- 2 sets- 1x daily- 7x weekly  Standing Single Leg Stance with Unilateral Counter Support- 3 reps- 10 hold- 1x daily- 7x weekly  Heel Raise- 5 reps- 2 sets- 3x weekly    [facility] [address] [place], [place] [address] Phone # [phone] Fax [phone]

## 2019-06-06 NOTE — Therapy (Signed)
[facility] [address] [place], [place], [address] Phone: [phone]   Fax:  [phone]  Physical Therapy Treatment/re-evaluation  Patient Details  Name: Adam Mcdonald MRN: [id number] Date of Birth: 01-Dec-1965 Referring Provider (PT): Dr. Tyson Dense    Encounter Date: 06/05/2019  PT End of Session - 06/05/19 1625    Visit Number  11    Number of Visits  36    Date for PT Re-Evaluation  07/13/19    Authorization Type  BCBS  Had previous PT 36 visits left    PT Start Time  1615    PT Stop Time  1658    PT Time Calculation (min)  43 min    Activity Tolerance  Patient tolerated treatment well       Past Medical History:  Diagnosis Date  . Chicken pox   . Depression   . History of MRSA infection   . Sleep apnea   . Ventral hernia without obstruction or gangrene   . Vitamin D deficiency     Past Surgical History:  Procedure Laterality Date  . UVULOPALATOPHARYNGOPLASTY      There were no vitals filed for this visit.  Subjective Assessment - 06/05/19 1619    Subjective  Pt states that he is doing well. He is doing better with walking down stairs. He just got off of work and has 2/10 pain. He carried #25 pounds at work this week and did ok with this.    Pertinent History  Surgery 02/16/19;  MD January    Patient Stated Goals  ROM, normal walking gait;  I walk slower now with short steps;  walk down steps normally without crab walking;  return to weight lifting goblet squat, dead lifts, TRX has home    Currently in Pain?  Yes    Pain Score  2     Pain Location  Heel    Pain Orientation  Left;Posterior    Pain Descriptors / Indicators  Aching;Sore    Pain Type  Chronic pain    Pain Radiating Towards  none    Pain Onset  More than a month ago    Pain Frequency  Intermittent    Aggravating Factors   alot of walking         [facility] PT Assessment - 06/06/19 0001      Assessment   Medical Diagnosis  left  Achilles surgery     Referring Provider (PT)  Dr. Tyson Dense     Onset Date/Surgical Date  02/16/19    Prior Therapy  6-8 weeks prior to surgery       Precautions   Precautions  None      Restrictions   Weight Bearing Restrictions  No      Balance Screen   Has the patient fallen in the past 6 months  No    Has the patient had a decrease in activity level because of a fear of falling?   No    Is the patient reluctant to leave their home because of a fear of falling?   No      Home Environment   Living Environment  Private residence      AROM   Left Ankle Dorsiflexion  8    Left Ankle Plantar Flexion  50    Left Ankle Inversion  30    Left Ankle Eversion  20   difficulty with this      Strength  Overall Strength Comments  unable to complete single leg heel raise on the Lt    Left Ankle Dorsiflexion  5/5    Left Ankle Inversion  5/5    Left Ankle Eversion  4/5      Ambulation/Gait   Pre-Gait Activities  ascend/descend steps with reciprocal pattern, no push-off on the Lt when ascending     Gait Comments  decreased pushoff on the Lt, symmetrical step length      High Level Balance   High Level Balance Comments  SLS on Lt: 3-5 sec                    [facility] Adult PT Treatment/Exercise - 06/05/19 0001      Ankle Exercises: Standing   Heel Raises  Both;5 reps      Ankle Exercises: Seated   Other Seated Ankle Exercises  Lt ankle eversion x10 reps, x10 reps with yellow TB    Other Seated Ankle Exercises  Long sitting Lt ankle PF with red TB x10 reps             PT Education - 06/05/19 1706    Education Details  updates to HEP    Person(s) Educated  Patient    Methods  Explanation    Comprehension  Verbalized understanding       PT Short Term Goals - 06/05/19 1626      PT SHORT TERM GOAL #1   Title  The patient will demonstrate knowledge of basic self care including edema control strategies, ROM, low level strengthening    Status  Achieved      PT  SHORT TERM GOAL #2   Title  The patient will be able to walk in the grocery store with pain level 5/10 or less    Time  4    Period  Weeks    Status  Achieved      PT SHORT TERM GOAL #3   Title  The patient will have improved left ankle DF to 4 degrees, PF to 50 degrees, Inversion and eversion to 18 degrees    Status  Achieved      PT SHORT TERM GOAL #4   Title  The patient will have decreased figure 8 girth measurement on left to 59 cm    Status  Achieved        PT Long Term Goals - 06/05/19 1626      PT LONG TERM GOAL #1   Title  The patient will be independent with safe self progression of HEP    Time  4    Period  Weeks    Status  On-going      PT LONG TERM GOAL #2   Title  The patient will be able to walk 20 minutes with pain level 4/10 or less    Baseline  45 minutes, 4/10 with this    Time  4    Period  Weeks    Status  Achieved      PT LONG TERM GOAL #3   Title  The patient will have improved ankle DF to 8 degrees and PF to 55 degrees needed for greater ease ascending/descending steps at this apartment    Baseline  PF 50 deg, 8 deg    Time  4    Period  Weeks    Status  Achieved      PT LONG TERM GOAL #4   Title  The patient will have improved  left ankle/foot strength 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    Baseline  1/3 trials on Lt    Time  4    Period  Weeks    Status  Partially Met      Additional Long Term Goals   Additional Long Term Goals  Yes      PT LONG TERM GOAL #6   Title  FOTO functional outcome score improved from 52% limitation to 37% indicating improved function with less pain    Time  8    Period  Weeks    Status  New      PT LONG TERM GOAL #7   Title  Pt will have increased Lt gastroc strength evident by his ability to complete atleast 10 single  leg heel raises, which will increase his efficiency with ambulation.    Time  4    Period  Weeks    Status  New            Plan - 06/06/19 0751    Clinical Impression Statement  Pt is making progress towards his goals, having met his short-term goals and nearly 50% of his long term goals. Pt's gait mechanics and ankle ROM are significantly improved. His ankle strength has also increased to atleast 4/5 MMT except for plantarflexion. Pt is now back at work, reporting no more than 2 or 3/10 pain at the end of the day, however he has several physical restrictions he is currently following. Pt has remaining limitations in gastroc strength and proprioception limiting his ability to complete daily activity and work tasks. He would continue to benefit from skilled PT to safely progress strength and proprioception. His HEP was updated today with good understanding.    Stability/Clinical Decision Making  Stable/Uncomplicated    Rehab Potential  Good    PT Frequency  2x / week    PT Duration  8 weeks    PT Treatment/Interventions  ADLs/Self Care Home Management;Cryotherapy;Electrical Stimulation;Iontophoresis 84m/ml Dexamethasone;Therapeutic activities;Therapeutic exercise;Neuromuscular re-education;Manual techniques;Taping;Patient/family education;Ultrasound;Dry needling;Moist Heat    PT Next Visit Plan  calf strength progression, proprioception, gradual return to work related tasks and holding heavy weight    PT Home Exercise Plan  Access Code: [email or web address]       Patient will benefit from skilled therapeutic intervention in order to improve the following deficits and impairments:  Abnormal gait, Pain, Decreased range of motion, Decreased activity tolerance, Impaired perceived functional ability, Decreased strength, Increased edema  Visit Diagnosis: Pain in left ankle and joints of left foot  Stiffness of left ankle, not elsewhere classified  Localized edema  Muscle weakness  (generalized)     Problem List Patient Active Problem List   Diagnosis Date Noted  . Low back pain 06/23/2018  . Depression, major, single episode, moderate [place] 06/02/2018  . Vitamin D deficiency 06/02/2018    8:00 AM,06/06/19 SSherol DadePT, DPT [facility] [address] [place] [address]Phone: [phone]  Fax:  [phone] Name: Adam ElaminMRN: [id number] of Birth: 608/03/1966

## 2019-06-12 ENCOUNTER — Ambulatory Visit (INDEPENDENT_AMBULATORY_CARE_PROVIDER_SITE_OTHER): Payer: BC Managed Care – PPO | Admitting: Podiatry

## 2019-06-12 ENCOUNTER — Other Ambulatory Visit: Payer: Self-pay

## 2019-06-12 DIAGNOSIS — Z9889 Other specified postprocedural states: Secondary | ICD-10-CM | POA: Diagnosis not present

## 2019-06-12 DIAGNOSIS — S86012D Strain of left Achilles tendon, subsequent encounter: Secondary | ICD-10-CM

## 2019-06-13 NOTE — Progress Notes (Signed)
He presents today for postop visit date of surgery February 16, 2019 status post gastroc recession calcaneal ostectomy and a Achilles tenolysis.  States that he has increasing pain by the end of the day to the area the pain level is about a 3 out of 10 he states that it feels much better than it did before surgery he continues physical therapy twice a week denies calf pain back pain chest pain shortness of breath.  States that the restrictions in place seem appropriate for the pain level at this point.  He does not feel that his work is causing the increase in pain by the end of the day.  Objective: Vital signs are stable he is alert and oriented x3.  Pulses are palpable.  He has nice palpable margins of the Achilles as it inserts on the left heel.  Minimal edema no cellulitis drainage or odor there appears to be some scar tissue that is holding down the skin to the deeper structures more than likely this is causing some of the soreness and tenderness at the sites by the end of the day.  Assessment: Well-healing surgical foot  Plan: I encouraged him to continue to massage therapy and physical therapy.  Instructed him on how to massage the area and help break up the deep scar tissue.  He will also instructed physical therapy of my wishes for them to help him break up the deep scar tissue around the calcaneus.  I will follow-up with him in 1 month.

## 2019-06-14 ENCOUNTER — Other Ambulatory Visit: Payer: Self-pay

## 2019-06-14 ENCOUNTER — Ambulatory Visit: Payer: BC Managed Care – PPO | Attending: Podiatry | Admitting: Physical Therapy

## 2019-06-14 DIAGNOSIS — M25672 Stiffness of left ankle, not elsewhere classified: Secondary | ICD-10-CM | POA: Diagnosis not present

## 2019-06-14 DIAGNOSIS — M6281 Muscle weakness (generalized): Secondary | ICD-10-CM | POA: Diagnosis not present

## 2019-06-14 DIAGNOSIS — M25572 Pain in left ankle and joints of left foot: Secondary | ICD-10-CM | POA: Diagnosis not present

## 2019-06-14 DIAGNOSIS — R600 Localized edema: Secondary | ICD-10-CM | POA: Diagnosis not present

## 2019-06-14 NOTE — Therapy (Signed)
[facility] [address], [place], [place], [address] Phone: [phone]   Fax:  [phone]  Physical Therapy Treatment  Patient Details  Name: Adam Mcdonald MRN: [id number] Date of Birth: 06-13-1965 Referring Provider (PT): Dr. Tyson Dense    Encounter Date: 06/14/2019  PT End of Session - 06/14/19 1712    Visit Number  12    Number of Visits  36    Date for PT Re-Evaluation  07/13/19    Authorization Type  BCBS  Had previous PT 36 visits left    PT Start Time  1530    PT Stop Time  1613    PT Time Calculation (min)  43 min    Activity Tolerance  Patient tolerated treatment well       Past Medical History:  Diagnosis Date  . Chicken pox   . Depression   . History of MRSA infection   . Sleep apnea   . Ventral hernia without obstruction or gangrene   . Vitamin D deficiency     Past Surgical History:  Procedure Laterality Date  . UVULOPALATOPHARYNGOPLASTY      There were no vitals filed for this visit.  Subjective Assessment - 06/14/19 1709    Subjective  The doctor said I have a lot of scar tissue where the Achilles attaches to my heel.   He wants that scar to be worked out so I bought a Dance movement psychotherapist from [employer] and it comes today.   I'm in constant pain now.  I kind of have the blues about this.  The doctor wrote for me to continue work modifcations another month.    Pertinent History  Surgery 02/16/19    Currently in Pain?  Yes    Pain Score  3     Pain Location  Ankle    Pain Orientation  Left                       [facility] Adult PT Treatment/Exercise - 06/14/19 0001      Manual Therapy   Manual therapy comments  distal scar mobilization instrument assisted     Joint Mobilization  grade 3 metatarsal, talocrual mobs 20 sec 5x    Soft tissue mobilization  gastroc/soleus, anterior tib, peri-incisional ;  instrument assissted to gastroc, Achilles, peroneals in sidelying and with weightbearing  dynamically       Ankle Exercises: Machines for Strengthening   Cybex Leg Press  seat 9 20# 3x 10       Ankle Exercises: Seated   Heel Raises  Left;20 reps    Heel Slides  Left;20 reps      Ankle Exercises: Standing   Other Standing Ankle Exercises  up and down steps reciprocally with discussion on progress/less compensatory toe out noted                PT Short Term Goals - 06/05/19 1626      PT SHORT TERM GOAL #1   Title  The patient will demonstrate knowledge of basic self care including edema control strategies, ROM, low level strengthening    Status  Achieved      PT SHORT TERM GOAL #2   Title  The patient will be able to walk in the grocery store with pain level 5/10 or less    Time  4    Period  Weeks    Status  Achieved      PT SHORT  TERM GOAL #3   Title  The patient will have improved left ankle DF to 4 degrees, PF to 50 degrees, Inversion and eversion to 18 degrees    Status  Achieved      PT SHORT TERM GOAL #4   Title  The patient will have decreased figure 8 girth measurement on left to 59 cm    Status  Achieved        PT Long Term Goals - 06/05/19 1626      PT LONG TERM GOAL #1   Title  The patient will be independent with safe self progression of HEP    Time  4    Period  Weeks    Status  On-going      PT LONG TERM GOAL #2   Title  The patient will be able to walk 20 minutes with pain level 4/10 or less    Baseline  45 minutes, 4/10 with this    Time  4    Period  Weeks    Status  Achieved      PT LONG TERM GOAL #3   Title  The patient will have improved ankle DF to 8 degrees and PF to 55 degrees needed for greater ease ascending/descending steps at this apartment    Baseline  PF 50 deg, 8 deg    Time  4    Period  Weeks    Status  Achieved      PT LONG TERM GOAL #4   Title  The patient will have improved left ankle/foot strength 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    Baseline  1/3 trials on Lt    Time  4    Period  Weeks    Status  Partially Met      Additional Long Term Goals   Additional Long Term Goals  Yes      PT LONG TERM GOAL #6   Title  FOTO functional outcome score improved from 52% limitation to 37% indicating improved function with less pain    Time  8    Period  Weeks    Status  New      PT LONG TERM GOAL #7   Title  Pt will have increased Lt gastroc strength evident by his ability to complete atleast 10 single leg heel raises, which will increase his efficiency with ambulation.    Time  4    Period  Weeks    Status  New            Plan - 06/14/19 1713    Clinical Impression Statement  The patient has much improved gastroc and Achilles soft tissue length following manual therapy with and without instrument assist.  He has sensitivity to pressure at most distal insertion at the heel.  Emphasized the good progress he has made so far including increased ankle dorsiflexion ROM, decreased compensatory toe out with descending steps and his ability today to do a single leg toe press now without assist from right leg.  Patient education on tissue dissociation and pain improvements over a longer period of time.    Rehab Potential  Good    PT Frequency  2x / week    PT Duration  8 weeks    PT Treatment/Interventions  ADLs/Self Care Home Management;Cryotherapy;Electrical Stimulation;Iontophoresis 104m/ml Dexamethasone;Therapeutic activities;Therapeutic exercise;Neuromuscular re-education;Manual techniques;Taping;Patient/family education;Ultrasound;Dry needling;Moist Heat    PT Next Visit Plan  calf strength progression, proprioception, gradual return to work related tasks and holding heavy weight    PT Home Exercise Plan  Access Code: [email or web address]       Patient will benefit from skilled therapeutic intervention in order to  improve the following deficits and impairments:  Abnormal gait, Pain, Decreased range of motion, Decreased activity tolerance, Impaired perceived functional ability, Decreased strength, Increased edema  Visit Diagnosis: Pain in left ankle and joints of left foot  Stiffness of left ankle, not elsewhere classified  Localized edema  Muscle weakness (generalized)     Problem List Patient Active Problem List   Diagnosis Date Noted  . Low back pain 06/23/2018  . Depression, major, single episode, moderate ([place] 06/02/2018  . Vitamin D deficiency 06/02/2018   SRuben Im PT 06/14/19 5:20 PM Phone: [phone]: [phone] Singh1/12/2019, 5:20 PM  [facility] [address] [place] [address]Phone: [phone]  Fax:  [phone] Name: DGaspard IsbellMRN: [id number] of Birth: [birth year]

## 2019-06-19 ENCOUNTER — Ambulatory Visit: Payer: BC Managed Care – PPO | Admitting: Physical Therapy

## 2019-06-19 ENCOUNTER — Other Ambulatory Visit: Payer: Self-pay

## 2019-06-19 DIAGNOSIS — M6281 Muscle weakness (generalized): Secondary | ICD-10-CM

## 2019-06-19 DIAGNOSIS — R600 Localized edema: Secondary | ICD-10-CM | POA: Diagnosis not present

## 2019-06-19 DIAGNOSIS — M25672 Stiffness of left ankle, not elsewhere classified: Secondary | ICD-10-CM | POA: Diagnosis not present

## 2019-06-19 DIAGNOSIS — M25572 Pain in left ankle and joints of left foot: Secondary | ICD-10-CM | POA: Diagnosis not present

## 2019-06-19 NOTE — Therapy (Signed)
[facility] [address] [place] [employer] [place], [place], [address] Phone: [phone]   Fax:  [phone]  Physical Therapy Treatment  Patient Details  Name: Adam Mcdonald MRN: [id number] Date of Birth: 23-Aug-1965 Referring Provider (PT): Dr. Tyson Dense    Encounter Date: 06/19/2019  PT End of Session - 06/19/19 1622    Visit Number  13    Number of Visits  36    Date for PT Re-Evaluation  07/13/19    Authorization Type  BCBS  Had previous PT 36 visits left    PT Start Time  1518    PT Stop Time  1614    PT Time Calculation (min)  56 min    Activity Tolerance  Patient tolerated treatment well       Past Medical History:  Diagnosis Date  . Chicken pox   . Depression   . History of MRSA infection   . Sleep apnea   . Ventral hernia without obstruction or gangrene   . Vitamin D deficiency     Past Surgical History:  Procedure Laterality Date  . UVULOPALATOPHARYNGOPLASTY      There were no vitals filed for this visit.  Subjective Assessment - 06/19/19 1617    Subjective  I did OK after last time but then I was really active on Friday and then Saturday at work and it was really hurting near my heel.    Pertinent History  Surgery 02/16/19    Patient Stated Goals  ROM, normal walking gait;  I walk slower now with short steps;  walk down steps normally without crab walking;  return to weight lifting goblet squat, dead lifts, TRX has home    Currently in Pain?  Yes    Pain Score  1     Pain Location  Ankle    Pain Orientation  Left                       [facility] Adult PT Treatment/Exercise - 06/19/19 0001      Manual Therapy   Manual therapy comments  distal scar mobilization instrument assisted     Joint Mobilization  grade 3 metatarsal, talocrual mobs 20 sec 5x    Soft tissue mobilization  gastroc/soleus, anterior tib, peri-incisional ;  instrument assissted to gastroc, Achilles, peroneals in sidelying and  with weightbearing dynamically       Ankle Exercises: Seated   Heel Raises  Left;10 reps   body weight supported bil heel raises 10x; tandem stand 10x    Heel Slides  Left;20 reps      Ankle Exercises: Standing   BAPS  Standing;Level 1;10 reps    Rebounder  weight shift forward, lateral, high knees, small bouncing 45 sec each     Heel Raises  20 reps   body weight supported bil heel raises10x; tandem stand 10x    Other Standing Ankle Exercises  mini lunge on BOSU 2x10    Other Standing Ankle Exercises  up and down steps reciprocally with discussion on progress/less compensatory toe out noted                PT Short Term Goals - 06/05/19 1626      PT SHORT TERM GOAL #1   Title  The patient will demonstrate knowledge of basic self care including edema control strategies, ROM, low level strengthening    Status  Achieved      PT SHORT TERM GOAL #2  Title  The patient will be able to walk in the grocery store with pain level 5/10 or less    Time  4    Period  Weeks    Status  Achieved      PT SHORT TERM GOAL #3   Title  The patient will have improved left ankle DF to 4 degrees, PF to 50 degrees, Inversion and eversion to 18 degrees    Status  Achieved      PT SHORT TERM GOAL #4   Title  The patient will have decreased figure 8 girth measurement on left to 59 cm    Status  Achieved        PT Long Term Goals - 06/05/19 1626      PT LONG TERM GOAL #1   Title  The patient will be independent with safe self progression of HEP    Time  4    Period  Weeks    Status  On-going      PT LONG TERM GOAL #2   Title  The patient will be able to walk 20 minutes with pain level 4/10 or less    Baseline  45 minutes, 4/10 with this    Time  4    Period  Weeks    Status  Achieved      PT LONG TERM GOAL #3   Title  The patient will have improved ankle DF to 8 degrees and PF to 55 degrees needed for greater ease ascending/descending steps at this apartment    Baseline  PF 50  deg, 8 deg    Time  4    Period  Weeks    Status  Achieved      PT LONG TERM GOAL #4   Title  The patient will have improved left ankle/foot strength 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    Baseline  1/3 trials on Lt    Time  4    Period  Weeks    Status  Partially Met      Additional Long Term Goals   Additional Long Term Goals  Yes      PT LONG TERM GOAL #6   Title  FOTO functional outcome score improved from 52% limitation to 37% indicating improved function with less pain    Time  8    Period  Weeks    Status  New      PT LONG TERM GOAL #7   Title  Pt will have increased Lt gastroc strength evident by his ability to complete atleast 10 single leg heel raises, which will increase his efficiency with ambulation.    Time  4    Period  Weeks    Status  New            Plan - 06/19/19 1622    Clinical Impression Statement  Improved soft tissue and scar compliance and extensibility noted and decreased sensitivity at distal Achilles insertion.  Minimal edema.  He is able to progress with body weight supported gastroc strengthening with upright heel raises.  Verbal cues to avoid compensatory toe out and to monitor response.  Pain level remains low throughout treatment session  but reports muscular fatigue as expected.    Examination-Activity Limitations  Stairs;Lift;Locomotion Level;Stand;Carry;Squat    Examination-Participation Restrictions  Community Activity;Other;Shop;Cleaning    Rehab Potential  Good    PT Frequency  2x / week    PT Duration  8 weeks    PT Treatment/Interventions  ADLs/Self Care Home Management;Cryotherapy;Electrical Stimulation;Iontophoresis 80m/ml Dexamethasone;Therapeutic activities;Therapeutic exercise;Neuromuscular re-education;Manual techniques;Taping;Patient/family  education;Ultrasound;Dry needling;Moist Heat    PT Next Visit Plan  calf strength progression, proprioception, gradual return to work related tasks and holding heavy weight    PT Home Exercise Plan  Access Code: [email or web address]       Patient will benefit from skilled therapeutic intervention in order to improve the following deficits and impairments:  Abnormal gait, Pain, Decreased range of motion, Decreased activity tolerance, Impaired perceived functional ability, Decreased strength, Increased edema  Visit Diagnosis: Pain in left ankle and joints of left foot  Stiffness of left ankle, not elsewhere classified  Muscle weakness (generalized)     Problem List Patient Active Problem List   Diagnosis Date Noted  . Low back pain 06/23/2018  . Depression, major, single episode, moderate ([place] 06/02/2018  . Vitamin D deficiency 06/02/2018   SRuben Im PT 06/19/19 4:27 PM Phone: [phone]: [phone] Singh1/05/2020, 4:26 PM  [facility] [address] [place] [address]Phone: [phone]  Fax:  [phone] Name: Adam GreenhalghMRN: [id number] of Birth: 601/12/1965

## 2019-06-21 ENCOUNTER — Other Ambulatory Visit: Payer: Self-pay

## 2019-06-21 ENCOUNTER — Ambulatory Visit: Payer: BC Managed Care – PPO | Admitting: Physical Therapy

## 2019-06-21 DIAGNOSIS — R600 Localized edema: Secondary | ICD-10-CM

## 2019-06-21 DIAGNOSIS — M25672 Stiffness of left ankle, not elsewhere classified: Secondary | ICD-10-CM

## 2019-06-21 DIAGNOSIS — M6281 Muscle weakness (generalized): Secondary | ICD-10-CM

## 2019-06-21 DIAGNOSIS — M25572 Pain in left ankle and joints of left foot: Secondary | ICD-10-CM

## 2019-06-21 NOTE — Therapy (Signed)
[facility] [address], [place], [place], [address] Phone: [phone]   Fax:  [phone]  Physical Therapy Treatment  Patient Details  Name: Adam Mcdonald MRN: [id number] Date of Birth: [birth year] Referring Provider (PT): Dr. Tyson Dense    Encounter Date: 06/21/2019  PT End of Session - 06/21/19 1702    Visit Number  14    Number of Visits  36    Date for PT Re-Evaluation  07/13/19    Authorization Type  BCBS  Had previous PT 36 visits left    PT Start Time  1530    PT Stop Time  1612    PT Time Calculation (min)  42 min    Activity Tolerance  Patient tolerated treatment well       Past Medical History:  Diagnosis Date  . Chicken pox   . Depression   . History of MRSA infection   . Sleep apnea   . Ventral hernia without obstruction or gangrene   . Vitamin D deficiency     Past Surgical History:  Procedure Laterality Date  . UVULOPALATOPHARYNGOPLASTY      There were no vitals filed for this visit.  Subjective Assessment - 06/21/19 1616    Subjective  I was pretty sore yesterday but today is OK.    Currently in Pain?  Yes    Pain Score  1     Pain Location  Ankle    Pain Orientation  Left         [facility] PT Assessment - 06/21/19 0001      Strength   Left Ankle Dorsiflexion  5/5    Left Ankle Plantar Flexion  3/5    Left Ankle Inversion  5/5    Left Ankle Eversion  4/5                   [facility] Adult PT Treatment/Exercise - 06/21/19 0001      Manual Therapy   Manual therapy comments  distal scar mobilization instrument assisted     Joint Mobilization  grade 3 metatarsal, talocrual mobs 20 sec 5x    Soft tissue mobilization  gastroc/soleus, anterior tib, peri-incisional ;  instrument assissted to gastroc, Achilles, peroneals in sidelying and with weightbearing dynamically       Ankle Exercises: Standing   SLS  standing on black foam with UE, head movements, reaching, eyes open  and closed, ball toss     Heel Raises  20 reps   body weight supported bil heel raises10x; tandem stand 10x    Toe Raise  --   using lat bar to assist with heel raises in tandem stand 10x   Heel Walk (Round Trip)  resisted backward and forward walk 25# 5x each     Other Standing Ankle Exercises  mini lunge on BOSU 2x10    Other Standing Ankle Exercises  stepping over and back over obstacle 5x right/left       Ankle Exercises: Seated   Other Seated Ankle Exercises  rocker board single leg 20x     Other Seated Ankle Exercises  isometric plantarflexion 10x 5 sec holds                PT Short Term Goals - 06/05/19 1626      PT SHORT TERM GOAL #1   Title  The patient will demonstrate knowledge of basic self care including edema control strategies, ROM, low level strengthening    Status  Achieved      PT SHORT TERM GOAL #2   Title  The patient will be able to walk in the grocery store with pain level 5/10 or less    Time  4    Period  Weeks    Status  Achieved      PT SHORT TERM GOAL #3   Title  The patient will have improved left ankle DF to 4 degrees, PF to 50 degrees, Inversion and eversion to 18 degrees    Status  Achieved      PT SHORT TERM GOAL #4   Title  The patient will have decreased figure 8 girth measurement on left to 59 cm    Status  Achieved        PT Long Term Goals - 06/05/19 1626      PT LONG TERM GOAL #1   Title  The patient will be independent with safe self progression of HEP    Time  4    Period  Weeks    Status  On-going      PT LONG TERM GOAL #2   Title  The patient will be able to walk 20 minutes with pain level 4/10 or less    Baseline  45 minutes, 4/10 with this    Time  4    Period  Weeks    Status  Achieved      PT LONG TERM GOAL #3   Title  The patient will have improved ankle DF to 8 degrees and PF to 55 degrees needed for greater ease ascending/descending steps at this apartment    Baseline  PF 50 deg, 8 deg    Time  4     Period  Weeks    Status  Achieved      PT LONG TERM GOAL #4   Title  The patient will have improved left ankle/foot strength 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    Baseline  1/3 trials on Lt    Time  4    Period  Weeks    Status  Partially Met      Additional Long Term Goals   Additional Long Term Goals  Yes      PT LONG TERM GOAL #6   Title  FOTO functional outcome score improved from 52% limitation to 37% indicating improved function with less pain    Time  8    Period  Weeks    Status  New      PT LONG TERM GOAL #7   Title  Pt will have increased Lt gastroc strength evident by his ability to complete atleast 10 single leg heel raises, which will increase his efficiency with ambulation.    Time  4    Period  Weeks    Status  New            Plan - 06/21/19 1702    Clinical Impression Statement  Much improved scar mobility and decreased sensitivity noted.  Minimal cues needed for compensatory external rotation in standing.  Continues to make progress with gastroc strengthening although limited to 10 repetitions with 25% body weight support.  Therapist monitoring pain level throughout  session.  Remains 2/10 or less.    PT Frequency  2x / week    PT Duration  8 weeks    PT Treatment/Interventions  ADLs/Self Care Home Management;Cryotherapy;Electrical Stimulation;Iontophoresis 74m/ml Dexamethasone;Therapeutic activities;Therapeutic exercise;Neuromuscular re-education;Manual techniques;Taping;Patient/family education;Ultrasound;Dry needling;Moist Heat    PT Next Visit Plan  recheck ROM; calf strength progression, proprioception, gradual return to work related tasks and holding heavy weight    PT Home Exercise Plan  Access Code: [email or web address]       Patient will benefit from skilled therapeutic  intervention in order to improve the following deficits and impairments:  Abnormal gait, Pain, Decreased range of motion, Decreased activity tolerance, Impaired perceived functional ability, Decreased strength, Increased edema  Visit Diagnosis: Pain in left ankle and joints of left foot  Stiffness of left ankle, not elsewhere classified  Muscle weakness (generalized)  Localized edema     Problem List Patient Active Problem List   Diagnosis Date Noted  . Low back pain 06/23/2018  . Depression, major, single episode, moderate ([place] 06/02/2018  . Vitamin D deficiency 06/02/2018   SRuben Im PT 06/21/19 5:07 PM Phone: [phone]: [phone] Singh1/14/2021, 5:06 PM  [facility] [address] [place] [address]Phone: [phone]  Fax:  [phone] Name: Adam TravelsteadMRN: [id number] of Birth: [birth year]

## 2019-06-26 ENCOUNTER — Ambulatory Visit: Payer: BC Managed Care – PPO | Admitting: Physical Therapy

## 2019-06-26 ENCOUNTER — Other Ambulatory Visit: Payer: Self-pay

## 2019-06-26 DIAGNOSIS — M6281 Muscle weakness (generalized): Secondary | ICD-10-CM

## 2019-06-26 DIAGNOSIS — M25572 Pain in left ankle and joints of left foot: Secondary | ICD-10-CM | POA: Diagnosis not present

## 2019-06-26 DIAGNOSIS — M25672 Stiffness of left ankle, not elsewhere classified: Secondary | ICD-10-CM | POA: Diagnosis not present

## 2019-06-26 DIAGNOSIS — R600 Localized edema: Secondary | ICD-10-CM | POA: Diagnosis not present

## 2019-06-26 NOTE — Therapy (Signed)
[facility] [address] [place], [place], [address] Phone: [phone]   Fax:  [phone]  Physical Therapy Treatment  Patient Details  Name: Adam Mcdonald MRN: [id number] Date of Birth: 1966-02-28 Referring Provider (PT): Dr. Tyson Dense    Encounter Date: 06/26/2019  PT End of Session - 06/26/19 1740    Visit Number  15    Number of Visits  36    Date for PT Re-Evaluation  07/13/19    Authorization Type  BCBS  Had previous PT 36 visits left    PT Start Time  1530    PT Stop Time  1615    PT Time Calculation (min)  45 min    Activity Tolerance  Patient tolerated treatment well       Past Medical History:  Diagnosis Date  . Chicken pox   . Depression   . History of MRSA infection   . Sleep apnea   . Ventral hernia without obstruction or gangrene   . Vitamin D deficiency     Past Surgical History:  Procedure Laterality Date  . UVULOPALATOPHARYNGOPLASTY      There were no vitals filed for this visit.  Subjective Assessment - 06/26/19 1741    Subjective  I climbed a ladder at work and that hurt a lot but I recovered.  I've been doing goblet squats at home and working on heel raises holding on to the counter.  I can do 5 reps.    Pertinent History  Surgery 02/16/19    Currently in Pain?  Yes    Pain Score  2     Pain Location  Ankle    Pain Orientation  Left         [facility] PT Assessment - 06/26/19 0001      AROM   Left Ankle Dorsiflexion  11    Left Ankle Plantar Flexion  57                   [facility] Adult PT Treatment/Exercise - 06/26/19 0001      Manual Therapy   Manual therapy comments  distal scar mobilization instrument assisted     Joint Mobilization  grade 3 metatarsal, talocrual mobs 20 sec 5x    Soft tissue mobilization  gastroc/soleus, anterior tib, peri-incisional ;  instrument assissted to gastroc, Achilles, peroneals in sidelying and with weightbearing dynamically       Ankle Exercises: Standing   SLS  SLS on blue pod with 3 ways and beach ball toss     Heel Raises  Left;Both;10 reps   counter assist, right toe touch    Toe Walk (Round Trip)  high step 2 laps     Side Shuffle (Round Trip)  2 laps     Braiding (Round Trip)  2 laps    Other Standing Ankle Exercises  up and down steps reciprocally     Other Standing Ankle Exercises  bil standing on BOSU 2 min       Ankle Exercises: Seated   Heel Slides Limitations  seated prostretch 10x     Other Seated Ankle Exercises  green band ankle plantarflexion 30x                PT Short Term Goals - 06/05/19 1626      PT SHORT TERM GOAL #1   Title  The patient will demonstrate knowledge of basic self care including edema control strategies, ROM, low level strengthening  Status  Achieved      PT SHORT TERM GOAL #2   Title  The patient will be able to walk in the grocery store with pain level 5/10 or less    Time  4    Period  Weeks    Status  Achieved      PT SHORT TERM GOAL #3   Title  The patient will have improved left ankle DF to 4 degrees, PF to 50 degrees, Inversion and eversion to 18 degrees    Status  Achieved      PT SHORT TERM GOAL #4   Title  The patient will have decreased figure 8 girth measurement on left to 59 cm    Status  Achieved        PT Long Term Goals - 06/05/19 1626      PT LONG TERM GOAL #1   Title  The patient will be independent with safe self progression of HEP    Time  4    Period  Weeks    Status  On-going      PT LONG TERM GOAL #2   Title  The patient will be able to walk 20 minutes with pain level 4/10 or less    Baseline  45 minutes, 4/10 with this    Time  4    Period  Weeks    Status  Achieved      PT LONG TERM GOAL #3   Title  The patient will have improved ankle DF to 8 degrees and PF to 55 degrees needed for greater ease ascending/descending steps at this apartment    Baseline  PF 50 deg, 8 deg    Time  4    Period  Weeks    Status   Achieved      PT LONG TERM GOAL #4   Title  The patient will have improved left ankle/foot strength 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    Baseline  1/3 trials on Lt    Time  4    Period  Weeks    Status  Partially Met      Additional Long Term Goals   Additional Long Term Goals  Yes      PT LONG TERM GOAL #6   Title  FOTO functional outcome score improved from 52% limitation to 37% indicating improved function with less pain    Time  8    Period  Weeks    Status  New      PT LONG TERM GOAL #7   Title  Pt will have increased Lt gastroc strength evident by his ability to complete atleast 10 single leg heel raises, which will increase his efficiency with ambulation.    Time  4    Period  Weeks    Status  New            Plan - 06/26/19 1742    Clinical Impression Statement  Improving scar and soft tissue mobility.  Gastroc soleus muscle weakness continues to be a limiting factor as well as decreased proprioception.  He is able to perform heel raises on left with less UE assist.  He is concerned  about the potential of inflamming the Achilles although his surgeon has reassured him that this won't happen.  Therapist monitoring response with all treatment interventions.    Rehab Potential  Good    PT Frequency  2x / week    PT Duration  8 weeks    PT Treatment/Interventions  ADLs/Self Care Home Management;Cryotherapy;Electrical Stimulation;Iontophoresis 29m/ml Dexamethasone;Therapeutic activities;Therapeutic exercise;Neuromuscular re-education;Manual techniques;Taping;Patient/family education;Ultrasound;Dry needling;Moist Heat    PT Next Visit Plan  calf strength progression, proprioception, gradual return to work related tasks and holding heavy weight    PT Home Exercise Plan  Access Code:  [email or web address]       Patient will benefit from skilled therapeutic intervention in order to improve the following deficits and impairments:  Abnormal gait, Pain, Decreased range of motion, Decreased activity tolerance, Impaired perceived functional ability, Decreased strength, Increased edema  Visit Diagnosis: Pain in left ankle and joints of left foot  Stiffness of left ankle, not elsewhere classified  Muscle weakness (generalized)  Localized edema     Problem List Patient Active Problem List   Diagnosis Date Noted  . Low back pain 06/23/2018  . Depression, major, single episode, moderate ([place] 06/02/2018  . Vitamin D deficiency 06/02/2018   SRuben Im PT 06/26/19 5:47 PM Phone: [phone]: [phone] Singh1/19/2021, 5:46 PM  [facility] [address] [place] [address]Phone: [phone]  Fax:  [phone] Name: Adam VecchioneMRN: [id number] of Birth: [birth year]

## 2019-06-28 ENCOUNTER — Ambulatory Visit: Payer: BC Managed Care – PPO | Admitting: Physical Therapy

## 2019-06-28 ENCOUNTER — Other Ambulatory Visit: Payer: Self-pay

## 2019-06-28 DIAGNOSIS — M25672 Stiffness of left ankle, not elsewhere classified: Secondary | ICD-10-CM | POA: Diagnosis not present

## 2019-06-28 DIAGNOSIS — M25572 Pain in left ankle and joints of left foot: Secondary | ICD-10-CM

## 2019-06-28 DIAGNOSIS — R600 Localized edema: Secondary | ICD-10-CM | POA: Diagnosis not present

## 2019-06-28 DIAGNOSIS — M6281 Muscle weakness (generalized): Secondary | ICD-10-CM

## 2019-06-28 NOTE — Therapy (Signed)
[facility] [address] [place], [place], [address] Phone: [phone]   Fax:  [phone]  Physical Therapy Treatment  Patient Details  Name: Adam Mcdonald MRN: [id number] Date of Birth: 1965-07-23 Referring Provider (PT): Dr. Tyson Dense    Encounter Date: 06/28/2019  PT End of Session - 06/28/19 1742    Visit Number  16    Number of Visits  36    Date for PT Re-Evaluation  07/13/19    Authorization Type  BCBS  Had previous PT 36 visits left    PT Start Time  1530    PT Stop Time  1610    PT Time Calculation (min)  40 min    Activity Tolerance  Patient tolerated treatment well       Past Medical History:  Diagnosis Date  . Chicken pox   . Depression   . History of MRSA infection   . Sleep apnea   . Ventral hernia without obstruction or gangrene   . Vitamin D deficiency     Past Surgical History:  Procedure Laterality Date  . UVULOPALATOPHARYNGOPLASTY      There were no vitals filed for this visit.  Subjective Assessment - 06/28/19 1531    Subjective  Active day at work, lots of stairs.  Mostly lateral ankle discomfort.    Currently in Pain?  Yes    Pain Score  2     Pain Location  Ankle    Pain Orientation  Left                       [facility] Adult PT Treatment/Exercise - 06/28/19 0001      Manual Therapy   Joint Mobilization  grade 3 metatarsal, talocrual mobs 20 sec 5x    Soft tissue mobilization  gastroc/soleus, anterior tib, peri-incisional ;  instrument assissted to gastroc, Achilles, peroneals, posterior tibialis       Ankle Exercises: Machines for Strengthening   Cybex Leg Press  seat 9 30# 3x 10       Ankle Exercises: Standing   SLS  SLS with cone touch on first step with and without UE support 10x     Rocker Board  3 minutes    Toe Walk (Round Trip)  tandem standing     Side Shuffle (Round Trip)  high step walking     Other Standing Ankle Exercises  single leg mini  squats with bil UE support 10x     Other Standing Ankle Exercises  discussion of home use of TRX, goblet squats, may try bike at the gym, discussed elliptical                PT Short Term Goals - 06/05/19 1626      PT SHORT TERM GOAL #1   Title  The patient will demonstrate knowledge of basic self care including edema control strategies, ROM, low level strengthening    Status  Achieved      PT SHORT TERM GOAL #2   Title  The patient will be able to walk in the grocery store with pain level 5/10 or less    Time  4    Period  Weeks    Status  Achieved      PT SHORT TERM GOAL #3   Title  The patient will have improved left ankle DF to 4 degrees, PF to 50 degrees, Inversion and eversion to 18 degrees    Status  Achieved      PT SHORT TERM GOAL #4   Title  The patient will have decreased figure 8 girth measurement on left to 59 cm    Status  Achieved        PT Long Term Goals - 06/05/19 1626      PT LONG TERM GOAL #1   Title  The patient will be independent with safe self progression of HEP    Time  4    Period  Weeks    Status  On-going      PT LONG TERM GOAL #2   Title  The patient will be able to walk 20 minutes with pain level 4/10 or less    Baseline  45 minutes, 4/10 with this    Time  4    Period  Weeks    Status  Achieved      PT LONG TERM GOAL #3   Title  The patient will have improved ankle DF to 8 degrees and PF to 55 degrees needed for greater ease ascending/descending steps at this apartment    Baseline  PF 50 deg, 8 deg    Time  4    Period  Weeks    Status  Achieved      PT LONG TERM GOAL #4   Title  The patient will have improved left ankle/foot strength 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    Baseline  1/3 trials on Lt    Time  4    Period  Weeks     Status  Partially Met      Additional Long Term Goals   Additional Long Term Goals  Yes      PT LONG TERM GOAL #6   Title  FOTO functional outcome score improved from 52% limitation to 37% indicating improved function with less pain    Time  8    Period  Weeks    Status  New      PT LONG TERM GOAL #7   Title  Pt will have increased Lt gastroc strength evident by his ability to complete atleast 10 single leg heel raises, which will increase his efficiency with ambulation.    Time  4    Period  Weeks    Status  New            Plan - 06/28/19 1742    Clinical Impression Statement  Mild increased in lateral ankle swelling but not surprising considering his reports of a lot of physical activity at work.  Pain in lateral ankle may be result of compensation due to lack of ankle dorsiflexion.  He is improving with gastroc lengthening and notable improvement in SLS although still impaired compared with right LE.  Quick muscular fatigue with gastroc strengthening.    PT Frequency  2x / week    PT Duration  8 weeks    PT Treatment/Interventions  ADLs/Self Care Home Management;Cryotherapy;Electrical Stimulation;Iontophoresis '4mg'$ /ml Dexamethasone;Therapeutic activities;Therapeutic exercise;Neuromuscular re-education;Manual techniques;Taping;Patient/family education;Ultrasound;Dry needling;Moist Heat    PT Next Visit Plan  calf strength progression, proprioception, gradual return to work related tasks and holding heavy weight    PT Home Exercise Plan  Access Code: [email or web address]  Patient will benefit from skilled therapeutic intervention in order to improve the following deficits and impairments:  Abnormal gait, Pain, Decreased range of motion, Decreased activity tolerance, Impaired perceived functional ability, Decreased strength, Increased edema  Visit Diagnosis: Pain in left ankle and joints of left foot  Stiffness of left ankle, not elsewhere classified  Muscle weakness  (generalized)  Localized edema     Problem List Patient Active Problem List   Diagnosis Date Noted  . Low back pain 06/23/2018  . Depression, major, single episode, moderate ([place]) 06/02/2018  . Vitamin D deficiency 06/02/2018   Ruben Im, PT 06/28/19 5:46 PM Phone: [phone] Fax: [phone] Alvera Singh 06/28/2019, 5:46 PM  [facility] [address] [place], [place], [address] Phone: [phone]   Fax:  [phone]  Name: Durelle Zepeda MRN: [id number] Date of Birth: 1965/11/23

## 2019-07-03 ENCOUNTER — Ambulatory Visit: Payer: BC Managed Care – PPO | Admitting: Physical Therapy

## 2019-07-03 ENCOUNTER — Encounter: Payer: Self-pay | Admitting: Physical Therapy

## 2019-07-03 ENCOUNTER — Other Ambulatory Visit: Payer: Self-pay

## 2019-07-03 DIAGNOSIS — M25572 Pain in left ankle and joints of left foot: Secondary | ICD-10-CM | POA: Diagnosis not present

## 2019-07-03 DIAGNOSIS — M6281 Muscle weakness (generalized): Secondary | ICD-10-CM

## 2019-07-03 DIAGNOSIS — M25672 Stiffness of left ankle, not elsewhere classified: Secondary | ICD-10-CM

## 2019-07-03 DIAGNOSIS — R600 Localized edema: Secondary | ICD-10-CM | POA: Diagnosis not present

## 2019-07-03 NOTE — Therapy (Signed)
[facility] [address], [place], [place], [address] Phone: [phone]   Fax:  [phone]  Physical Therapy Treatment  Patient Details  Name: Adam Mcdonald MRN: [id number] Date of Birth: 23-Oct-1965 Referring Provider (PT): Dr. Tyson Dense    Encounter Date: 07/03/2019  PT End of Session - 07/03/19 1733    Visit Number  17    Number of Visits  36    Date for PT Re-Evaluation  07/13/19    Authorization Type  BCBS  Had previous PT 36 visits left    PT Start Time  1530    PT Stop Time  1614    PT Time Calculation (min)  44 min    Activity Tolerance  Patient tolerated treatment well       Past Medical History:  Diagnosis Date  . Chicken pox   . Depression   . History of MRSA infection   . Sleep apnea   . Ventral hernia without obstruction or gangrene   . Vitamin D deficiency     Past Surgical History:  Procedure Laterality Date  . UVULOPALATOPHARYNGOPLASTY      There were no vitals filed for this visit.  Subjective Assessment - 07/03/19 1531    Subjective  I had a scare on Friday when my heel caught on the curb.  I was pretty sore for Friday and Saturday.  Able to do 30 reps with ankle press, I think I can move up to a harder color.  It feels like a sharp pin prick near heel.  Staying active climbing stairs and  so pain stays around 3-4/10 range.    Limitations  House hold activities;Standing;Walking    How long can you stand comfortably?  > 10 minutes    How long can you walk comfortably?  not even a little bit;  in [place] walked across the store but very painful 6/10    Patient Stated Goals  ROM, normal walking gait;  I walk slower now with short steps;  walk down steps normally without crab walking;  return to weight lifting goblet squat, dead lifts, TRX has home    Currently in Pain?  Yes    Pain Score  3     Pain Location  Ankle    Pain Orientation  Left                       [facility]  Adult PT Treatment/Exercise - 07/03/19 0001      Manual Therapy   Manual therapy comments  distal scar mobilization instrument assisted     Joint Mobilization  grade 3 metatarsal, talocrual mobs 20 sec 5x    Soft tissue mobilization  gastroc/soleus, anterior tib, peri-incisional ;  instrument assissted to gastroc, Achilles, peroneals, posterior tibialis       Ankle Exercises: Standing   SLS  SLS with cone touch on first step with and without UE support 10x     Rocker Board  3 minutes    Rebounder  standing on BOSU 2 legs without and with UE green band extensions     Other Standing Ankle Exercises  mini lunge with blue band rows 10x 2      Ankle Exercises: Seated   Other Seated Ankle Exercises  blue band ankle plantarflexion 12x                PT Short Term Goals - 06/05/19 1626      PT SHORT  TERM GOAL #1   Title  The patient will demonstrate knowledge of basic self care including edema control strategies, ROM, low level strengthening    Status  Achieved      PT SHORT TERM GOAL #2   Title  The patient will be able to walk in the grocery store with pain level 5/10 or less    Time  4    Period  Weeks    Status  Achieved      PT SHORT TERM GOAL #3   Title  The patient will have improved left ankle DF to 4 degrees, PF to 50 degrees, Inversion and eversion to 18 degrees    Status  Achieved      PT SHORT TERM GOAL #4   Title  The patient will have decreased figure 8 girth measurement on left to 59 cm    Status  Achieved        PT Long Term Goals - 07/03/19 1737      PT LONG TERM GOAL #1   Title  The patient will be independent with safe self progression of HEP    Target Date  07/13/19      PT LONG TERM GOAL #2   Title  The patient will be able to walk 20 minutes with pain level 4/10 or less    Status  Achieved      PT LONG TERM GOAL #3   Title  The patient will have improved ankle DF to 8 degrees and PF to 55 degrees needed for greater ease ascending/descending  steps at this apartment    Status  Achieved      PT LONG TERM GOAL #4   Title  The patient will have improved left ankle/foot strength 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    Time  4    Period  Weeks    Status  Partially Met      PT LONG TERM GOAL #6   Title  FOTO functional outcome score improved from 52% limitation to 37% indicating improved function with less pain    Time  8    Period  Weeks    Status  On-going      PT LONG TERM GOAL #7   Title  Pt will have increased Lt gastroc strength evident by his ability to complete atleast 10 single leg heel raises, which will increase his efficiency with ambulation.    Time  4    Period  Weeks    Status  On-going            Plan - 07/03/19 1733    Clinical Impression Statement  The patient reports slightly more distal heel pain since a misstep on the curb 4 days ago.  No changes in edema or soft tissue.  Some thickness of scar in mid section of distal incision but overall much improved tissue extensibility and mobility.  He is able to increase resistance with ankle plantarflexion strengthening although fatigues rather quickly.  Proprioception improving but still impaired compared with right LE.  Therapist monitoring response with all treatment interventions.    Rehab Potential  Good    PT Frequency  2x / week    PT Duration  8 weeks    PT Treatment/Interventions  ADLs/Self Care Home Management;Cryotherapy;Electrical Stimulation;Iontophoresis 45m/ml Dexamethasone;Therapeutic activities;Therapeutic exercise;Neuromuscular re-education;Manual techniques;Taping;Patient/family education;Ultrasound;Dry needling;Moist Heat    PT Next Visit Plan  calf strength progression, proprioception, gradual return to work related tasks and holding heavy weight; manual therapy    PT Home Exercise  Plan  Access Code: [email or web address]       Patient will benefit from skilled therapeutic intervention in order to improve the following deficits and impairments:  Abnormal gait, Pain, Decreased range of motion, Decreased activity tolerance, Impaired perceived functional ability, Decreased strength, Increased edema  Visit Diagnosis: Pain in left ankle and joints of left foot  Stiffness of left ankle, not elsewhere classified  Muscle weakness (generalized)  Localized edema     Problem List Patient Active Problem List   Diagnosis Date Noted  . Low back pain 06/23/2018  . Depression, major, single episode, moderate ([place] 06/02/2018  . Vitamin D deficiency 06/02/2018   SRuben Im PT 07/03/19 5:39 PM Phone: [phone]: [phone] Singh1/26/2021, 5:39 PM  [facility] [address] [place] [address]: [phone]  Fax:  [phone] Name: DMickael McnuttMRN: [id number] of Birth: 610-27-1967

## 2019-07-05 ENCOUNTER — Other Ambulatory Visit: Payer: Self-pay

## 2019-07-05 ENCOUNTER — Ambulatory Visit: Payer: BC Managed Care – PPO | Admitting: Physical Therapy

## 2019-07-05 DIAGNOSIS — M25572 Pain in left ankle and joints of left foot: Secondary | ICD-10-CM

## 2019-07-05 DIAGNOSIS — M6281 Muscle weakness (generalized): Secondary | ICD-10-CM

## 2019-07-05 DIAGNOSIS — R600 Localized edema: Secondary | ICD-10-CM

## 2019-07-05 DIAGNOSIS — M25672 Stiffness of left ankle, not elsewhere classified: Secondary | ICD-10-CM

## 2019-07-05 NOTE — Therapy (Signed)
[facility] [address] [place], [place], [address] Phone: [phone]   Fax:  [phone]  Physical Therapy Treatment  Patient Details  Name: Adam Mcdonald MRN: [id number] Date of Birth: 03/13/1966 Referring Provider (PT): Dr. Tyson Dense    Encounter Date: 07/05/2019  PT End of Session - 07/05/19 1620    Visit Number  18    Number of Visits  36    Date for PT Re-Evaluation  07/13/19    Authorization Type  BCBS  Had previous PT 36 visits left    PT Start Time  1530    PT Stop Time  1615    PT Time Calculation (min)  45 min    Activity Tolerance  Patient tolerated treatment well       Past Medical History:  Diagnosis Date  . Chicken pox   . Depression   . History of MRSA infection   . Sleep apnea   . Ventral hernia without obstruction or gangrene   . Vitamin D deficiency     Past Surgical History:  Procedure Laterality Date  . UVULOPALATOPHARYNGOPLASTY      There were no vitals filed for this visit.  Subjective Assessment - 07/05/19 1531    Subjective  Some discomfort on the sides of my heelcord.  My heel caught and I stumbled this morning, so I went home to rest it.    Currently in Pain?  Yes    Pain Location  Ankle    Pain Orientation  Left                       [facility] Adult PT Treatment/Exercise - 07/05/19 0001      Knee/Hip Exercises: Standing   Other Standing Knee Exercises  squatting with shifting weight side to side 3x       Manual Therapy   Manual therapy comments  distal scar mobilization instrument assisted     Joint Mobilization  grade 3 metatarsal, talocrual mobs 20 sec 5x    Soft tissue mobilization  gastroc/soleus, anterior tib, peri-incisional ;  instrument assissted to gastroc, Achilles, peroneals, posterior tibialis       Ankle Exercises: Machines for Strengthening   Cybex Leg Press  seat 9 35# 2x 10       Ankle Exercises: Standing   Heel Raises  Both;10 reps     Balance Beam  box lifts and lowers 5x each direction with min UE touch support     Other Standing Ankle Exercises  side stepping with red band around forefoot 4 laps       Ankle Exercises: Seated   Other Seated Ankle Exercises  discussed progression of blue band plantar flexion to 3 sets of 10               PT Short Term Goals - 06/05/19 1626      PT SHORT TERM GOAL #1   Title  The patient will demonstrate knowledge of basic self care including edema control strategies, ROM, low level strengthening    Status  Achieved      PT SHORT TERM GOAL #2   Title  The patient will be able to walk in the grocery store with pain level 5/10 or less    Time  4    Period  Weeks    Status  Achieved      PT SHORT TERM GOAL #3   Title  The patient will have improved  left ankle DF to 4 degrees, PF to 50 degrees, Inversion and eversion to 18 degrees    Status  Achieved      PT SHORT TERM GOAL #4   Title  The patient will have decreased figure 8 girth measurement on left to 59 cm    Status  Achieved        PT Long Term Goals - 07/03/19 1737      PT LONG TERM GOAL #1   Title  The patient will be independent with safe self progression of HEP    Target Date  07/13/19      PT LONG TERM GOAL #2   Title  The patient will be able to walk 20 minutes with pain level 4/10 or less    Status  Achieved      PT LONG TERM GOAL #3   Title  The patient will have improved ankle DF to 8 degrees and PF to 55 degrees needed for greater ease ascending/descending steps at this apartment    Status  Achieved      PT LONG TERM GOAL #4   Title  The patient will have improved left ankle/foot strength 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    Time  4    Period  Weeks    Status  Partially Met      PT LONG TERM GOAL #6   Title  FOTO functional  outcome score improved from 52% limitation to 37% indicating improved function with less pain    Time  8    Period  Weeks    Status  On-going      PT LONG TERM GOAL #7   Title  Pt will have increased Lt gastroc strength evident by his ability to complete atleast 10 single leg heel raises, which will increase his efficiency with ambulation.    Time  4    Period  Weeks    Status  On-going            Plan - 07/05/19 1621    Clinical Impression Statement  Despite an episdode at work this morning when he stumbled, the patient has no increase in pain, swelling or any adverse changes.  His scar mobility and gastroc muscle extensibility continues to improve.  Gastroc strength improving although he continues to lack strength to do a single leg press.  He is now able to do 75% body weight lifts/lowers of the heel.  Therapist closely monitoring response with all interventions.    PT Frequency  2x / week    PT Duration  8 weeks    PT Treatment/Interventions  ADLs/Self Care Home Management;Cryotherapy;Electrical Stimulation;Iontophoresis 50m/ml Dexamethasone;Therapeutic activities;Therapeutic exercise;Neuromuscular re-education;Manual techniques;Taping;Patient/family education;Ultrasound;Dry needling;Moist Heat    PT Next Visit Plan  recheck ROM; SLS time, FOTO  and MMT prior to MD appt on 2/4;  calf strength progression, proprioception, gradual return to work related tasks and holding heavy weight; manual therapy    PT Home Exercise Plan  Access Code: [email or web address]       Patient will benefit from skilled therapeutic intervention in order to improve the following deficits and impairments:  Abnormal gait, Pain, Decreased range of motion, Decreased activity tolerance, Impaired perceived functional ability, Decreased strength, Increased edema  Visit Diagnosis: Pain in  left ankle and joints of left foot  Stiffness of left ankle, not elsewhere classified  Muscle weakness (generalized)  Localized  edema     Problem List Patient Active Problem List   Diagnosis Date Noted  . Low back pain 06/23/2018  . Depression, major, single episode, moderate ([place]) 06/02/2018  . Vitamin D deficiency 06/02/2018   Ruben Im, PT 07/05/19 4:29 PM Phone: [phone] Fax: [phone] Alvera Singh 07/05/2019, 4:29 PM  [facility] [address], [place], [place], [address] Phone: [phone]   Fax:  [phone]  Name: Adam Mcdonald MRN: [id number] Date of Birth: [birth year]

## 2019-07-09 ENCOUNTER — Other Ambulatory Visit: Payer: Self-pay | Admitting: Family Medicine

## 2019-07-09 DIAGNOSIS — F321 Major depressive disorder, single episode, moderate: Secondary | ICD-10-CM

## 2019-07-09 DIAGNOSIS — G8929 Other chronic pain: Secondary | ICD-10-CM

## 2019-07-09 DIAGNOSIS — M549 Dorsalgia, unspecified: Secondary | ICD-10-CM

## 2019-07-10 ENCOUNTER — Ambulatory Visit: Payer: BC Managed Care – PPO | Attending: Podiatry | Admitting: Physical Therapy

## 2019-07-10 ENCOUNTER — Encounter: Payer: BC Managed Care – PPO | Admitting: Podiatry

## 2019-07-10 ENCOUNTER — Other Ambulatory Visit: Payer: Self-pay

## 2019-07-10 DIAGNOSIS — M25572 Pain in left ankle and joints of left foot: Secondary | ICD-10-CM | POA: Diagnosis not present

## 2019-07-10 DIAGNOSIS — R600 Localized edema: Secondary | ICD-10-CM | POA: Diagnosis not present

## 2019-07-10 DIAGNOSIS — M25672 Stiffness of left ankle, not elsewhere classified: Secondary | ICD-10-CM | POA: Insufficient documentation

## 2019-07-10 DIAGNOSIS — M6281 Muscle weakness (generalized): Secondary | ICD-10-CM | POA: Insufficient documentation

## 2019-07-10 NOTE — Therapy (Signed)
[facility] [address] [place], [place], [address] Phone: [phone]   Fax:  [phone]  Physical Therapy Treatment/Recertification   Patient Details  Name: Adam Mcdonald MRN: [id number] Date of Birth: 07/08/1965 Referring Provider (PT): Dr. Tyson Dense    Encounter Date: 07/10/2019  PT End of Session - 07/10/19 1720    Visit Number  19    Number of Visits  36    Date for PT Re-Evaluation  08/07/19    Authorization Type  BCBS  Had previous PT 36 visits left    PT Start Time  1530    PT Stop Time  1614    PT Time Calculation (min)  44 min    Activity Tolerance  Patient tolerated treatment well       Past Medical History:  Diagnosis Date  . Chicken pox   . Depression   . History of MRSA infection   . Sleep apnea   . Ventral hernia without obstruction or gangrene   . Vitamin D deficiency     Past Surgical History:  Procedure Laterality Date  . UVULOPALATOPHARYNGOPLASTY      There were no vitals filed for this visit.  Subjective Assessment - 07/10/19 1554    Subjective  Doing pretty good at work.  I can walk about 45 minutes.  Climbed a ladder but didn't stay there to work.    Pertinent History  Surgery 02/16/19    How long can you walk comfortably?  45 minutes with minimal difficulty    Currently in Pain?  Yes    Pain Score  1     Pain Location  Ankle    Pain Orientation  Left         [facility] PT Assessment - 07/10/19 0001      Observation/Other Assessments   Focus on Therapeutic Outcomes (FOTO)   29% limitation       AROM   Left Ankle Dorsiflexion  12    Left Ankle Plantar Flexion  57    Left Ankle Inversion  36    Left Ankle Eversion  40      Strength   Overall Strength  --   SLS 23 sec    Overall Strength Comments  unable to complete single leg heel raise on the Lt;  able to do bil heel raises 25 reps with 50/50 weight bearing     Left Ankle Dorsiflexion  5/5    Left Ankle Plantar  Flexion  3/5    Left Ankle Inversion  5/5    Left Ankle Eversion  4+/5                   [facility] Adult PT Treatment/Exercise - 07/10/19 0001      Manual Therapy   Manual therapy comments  distal scar mobilization instrument assisted     Joint Mobilization  grade 3 metatarsal, talocrual mobs 20 sec 5x    Soft tissue mobilization  gastroc/soleus, anterior tib, peri-incisional ;  instrument assissted to gastroc, Achilles, peroneals, posterior tibialis       Ankle Exercises: Machines for Strengthening   Cybex Leg Press  seat 9 40# 2x 10       Ankle Exercises: Standing   SLS  SLS on blue pod with right 4 ways     Rocker Board  1 minute   single leg with foot more forward on board   Other Standing Ankle Exercises  up/down stairs  PT Short Term Goals - 07/10/19 1720      PT SHORT TERM GOAL #1   Title  The patient will demonstrate knowledge of basic self care including edema control strategies, ROM, low level strengthening    Status  Achieved      PT SHORT TERM GOAL #2   Title  The patient will be able to walk in the grocery store with pain level 5/10 or less    Status  Achieved      PT SHORT TERM GOAL #3   Title  The patient will have improved left ankle DF to 4 degrees, PF to 50 degrees, Inversion and eversion to 18 degrees    Status  Achieved      PT SHORT TERM GOAL #4   Title  The patient will have decreased figure 8 girth measurement on left to 59 cm    Status  Achieved        PT Long Term Goals - 07/10/19 1553      PT LONG TERM GOAL #1   Title  The patient will be independent with safe self progression of HEP    Time  4    Period  Weeks    Status  On-going    Target Date  08/07/19      PT LONG TERM GOAL #2   Title  The patient will be able to walk 20 minutes with pain level 4/10 or less    Status  Achieved      PT LONG TERM GOAL #3   Title  The patient will have improved ankle DF to 8 degrees and PF to 55 degrees needed for  greater ease ascending/descending steps at this apartment    Status  Achieved      PT LONG TERM GOAL #5   Title  The patient will be able to single leg stand at least 5 sec, 2/3 trials.    Status  Achieved      PT LONG TERM GOAL #6   Title  FOTO functional outcome score improved from 52% limitation to 37% indicating improved function with less pain    Status  Achieved      PT LONG TERM GOAL #7   Title  Pt will have increased Lt gastroc strength evident by his ability to complete atleast 5 single leg heel raises, which will increase his efficiency with ambulation.    Time  4    Period  Weeks    Status  Revised            Plan - 07/10/19 1722    Clinical Impression Statement  The patient has made good improvements in ROM especially inversion and eversion but also with ankle dorsiflexion 12 degrees which allows the patient to descend stairs normally.  Much improved soft tissue and scar mobility noted.    His single leg stance time has improved to 23 sec.  His strength is good in dorsiflexors, invertors and evertors however he is unable to do a single heel raise.  He can do 10 reps of double heel raises.  He is able to walk > 1 mile and is doing most of his work tasks although he has not tried to stay on a ladder to work for a period of time.  His FOTO functional outcome score has improved significantly.  He is approaching completion of PT however he would benefit from 3-4 more visits for advancement of strengthening of plantarflexors and advanced proprioceptive work.  Rehab Potential  Good    PT Frequency  1x / week    PT Duration  4 weeks    PT Treatment/Interventions  ADLs/Self Care Home Management;Cryotherapy;Electrical Stimulation;Iontophoresis 4mg /ml Dexamethasone;Therapeutic activities;Therapeutic exercise;Neuromuscular re-education;Manual techniques;Taping;Patient/family education;Ultrasound;Dry needling;Moist Heat    PT Next Visit Plan  plantar flexion strengthening;  single leg  rocker board, single leg calf press;  proprioception to help with standing on ladder to work    PT Home Exercise Plan  Access Code: [email or web address]       Patient will benefit from skilled therapeutic intervention in order to improve the following deficits and impairments:  Abnormal gait, Pain, Decreased range of motion, Decreased activity tolerance, Impaired perceived functional ability, Decreased strength, Increased edema  Visit Diagnosis: Pain in left ankle and joints of left foot - Plan: PT plan of care cert/re-cert  Stiffness of left ankle, not elsewhere classified - Plan: PT plan of care cert/re-cert  Muscle weakness (generalized) - Plan: PT plan of care cert/re-cert  Localized edema - Plan: PT plan of care cert/re-cert     Problem List Patient Active Problem List   Diagnosis Date Noted  . Low back pain 06/23/2018  . Depression, major, single episode, moderate ([place]) 06/02/2018  . Vitamin D deficiency 06/02/2018   Ruben Im, PT 07/10/19 5:34 PM Phone: [phone] Fax: [phone] Alvera Singh 07/10/2019, 5:33 PM  [facility] [address], [place], [place], [address] Phone: [phone]   Fax:  [phone]  Name: Adam Mcdonald MRN: [id number] Date of Birth: 14-Jan-1966

## 2019-07-12 ENCOUNTER — Encounter: Payer: Self-pay | Admitting: Podiatry

## 2019-07-12 ENCOUNTER — Ambulatory Visit (INDEPENDENT_AMBULATORY_CARE_PROVIDER_SITE_OTHER): Payer: BC Managed Care – PPO | Admitting: Podiatry

## 2019-07-12 ENCOUNTER — Other Ambulatory Visit: Payer: Self-pay

## 2019-07-12 DIAGNOSIS — S86012D Strain of left Achilles tendon, subsequent encounter: Secondary | ICD-10-CM | POA: Diagnosis not present

## 2019-07-12 DIAGNOSIS — Z9889 Other specified postprocedural states: Secondary | ICD-10-CM

## 2019-07-14 NOTE — Progress Notes (Signed)
He presents today postop visit date of surgery February 16, 2019 status post gastroc recession and retrocalcaneal work.  He states that he is doing much better he has yet to be able to get up on his toes of his left foot but he is feeling much better.  States that at worst the pain is only a 2 out of 10 now but most of the time is doing pretty well.  States that he continues physical therapy.  Objective: Vital signs are stable he is alert and oriented x3 much decrease in edema no erythema cellulitis drainage or odor good strength on plantar flexion.  Assessment: Well-healing surgical foot leg.  Plan: Continue physical therapy I want him to be at least 6 months out before we would let him back for 100% full duty.  We will write another note of restrictions today and I will follow-up with him in another month to 6 weeks.

## 2019-07-16 ENCOUNTER — Encounter: Payer: Self-pay | Admitting: Family Medicine

## 2019-07-16 ENCOUNTER — Ambulatory Visit: Payer: BC Managed Care – PPO | Admitting: Family Medicine

## 2019-07-16 ENCOUNTER — Other Ambulatory Visit: Payer: Self-pay

## 2019-07-16 VITALS — BP 150/84 | HR 74 | Temp 98.8°F | Ht 68.0 in | Wt 256.4 lb

## 2019-07-16 DIAGNOSIS — E559 Vitamin D deficiency, unspecified: Secondary | ICD-10-CM | POA: Diagnosis not present

## 2019-07-16 DIAGNOSIS — I10 Essential (primary) hypertension: Secondary | ICD-10-CM

## 2019-07-16 DIAGNOSIS — Z23 Encounter for immunization: Secondary | ICD-10-CM | POA: Diagnosis not present

## 2019-07-16 DIAGNOSIS — R5383 Other fatigue: Secondary | ICD-10-CM | POA: Diagnosis not present

## 2019-07-16 DIAGNOSIS — G8929 Other chronic pain: Secondary | ICD-10-CM

## 2019-07-16 DIAGNOSIS — M25572 Pain in left ankle and joints of left foot: Secondary | ICD-10-CM

## 2019-07-16 DIAGNOSIS — F321 Major depressive disorder, single episode, moderate: Secondary | ICD-10-CM | POA: Diagnosis not present

## 2019-07-16 LAB — CBC WITH DIFFERENTIAL/PLATELET
Basophils Absolute: 0 10*3/uL (ref 0.0–0.1)
Basophils Relative: 0.4 % (ref 0.0–3.0)
Eosinophils Absolute: 0.1 10*3/uL (ref 0.0–0.7)
Eosinophils Relative: 2 % (ref 0.0–5.0)
HCT: 49.3 % (ref 39.0–52.0)
Hemoglobin: 17.2 g/dL — ABNORMAL HIGH (ref 13.0–17.0)
Lymphocytes Relative: 29.2 % (ref 12.0–46.0)
Lymphs Abs: 2 10*3/uL (ref 0.7–4.0)
MCHC: 34.9 g/dL (ref 30.0–36.0)
MCV: 90.5 fl (ref 78.0–100.0)
Monocytes Absolute: 0.7 10*3/uL (ref 0.1–1.0)
Monocytes Relative: 10.8 % (ref 3.0–12.0)
Neutro Abs: 4 10*3/uL (ref 1.4–7.7)
Neutrophils Relative %: 57.6 % (ref 43.0–77.0)
Platelets: 276 10*3/uL (ref 150.0–400.0)
RBC: 5.45 Mil/uL (ref 4.22–5.81)
RDW: 13.5 % (ref 11.5–15.5)
WBC: 6.9 10*3/uL (ref 4.0–10.5)

## 2019-07-16 LAB — COMPREHENSIVE METABOLIC PANEL
ALT: 58 U/L — ABNORMAL HIGH (ref 0–53)
AST: 36 U/L (ref 0–37)
Albumin: 4.4 g/dL (ref 3.5–5.2)
Alkaline Phosphatase: 70 U/L (ref 39–117)
BUN: 14 mg/dL (ref 6–23)
CO2: 27 mEq/L (ref 19–32)
Calcium: 9.6 mg/dL (ref 8.4–10.5)
Chloride: 101 mEq/L (ref 96–112)
Creatinine, Ser: 0.92 mg/dL (ref 0.40–1.50)
GFR: 86.51 mL/min (ref >60.00)
Glucose, Bld: 85 mg/dL (ref 70–99)
Potassium: 4.4 mEq/L (ref 3.5–5.1)
Sodium: 137 mEq/L (ref 135–145)
Total Bilirubin: 0.6 mg/dL (ref 0.2–1.2)
Total Protein: 7.3 g/dL (ref 6.0–8.3)

## 2019-07-16 LAB — VITAMIN B12: Vitamin B-12: 376 pg/mL (ref 211–911)

## 2019-07-16 MED ORDER — BUPROPION HCL ER (XL) 300 MG PO TB24: 300.0000 mg | ORAL_TABLET | Freq: Every day | ORAL | 1 refills | Status: DC

## 2019-07-16 MED ORDER — BUPROPION HCL ER (XL) 150 MG PO TB24: 150.0000 mg | ORAL_TABLET | Freq: Every day | ORAL | 1 refills | Status: DC

## 2019-07-16 MED ORDER — LOSARTAN POTASSIUM 25 MG PO TABS: 25.0000 mg | ORAL_TABLET | Freq: Every day | ORAL | 1 refills | Status: DC

## 2019-07-16 MED ORDER — MELOXICAM 15 MG PO TABS: 15.0000 mg | ORAL_TABLET | Freq: Every day | ORAL | 1 refills | Status: DC

## 2019-07-16 NOTE — Progress Notes (Signed)
Adam Mcdonald DOB: [birth year] Encounter date: 07/16/2019  This is a 54 y.o. male who presents with Chief Complaint  Patient presents with  . Follow-up    History of present illness: Depression: On Wellbutrin: at one time was taking 400mg  and at one point dropped to 300 and didn't have the 150mg  refilled. For last few weeks suffering from extreme exhaustion. Going to bed 7pm, up 2-3am. Not motivated to work out, just feeling worse overall. Has some equipment to work out at home. Eating too much. Tries to give self just 12 hour window to eat. Feels that he does well with this. Meal replacement shake with higher protein for breakfast. Was getting some pre-made meals delivered. 550-650 calories. Not doing much fruit.   Wearing cpap; just shaved beard 2 days ago and had 100% mask fit and felt better. So feels if he stays shaved then mask fit will be better and energy level will be better.   Had achilles tendon surgery. Surgery was ok and has been released to do activities at work. Pain has increased as he is doing more. Can almost walk down stairs normally. Still working to increase strength/mobility. Does note relief with meloxicam. Taking 15mg  daily. Takes tylenol as well. Still in active physical therapy for next month. Still with partial restrictions at work. Follows up with surgeon middle of March. 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   Drinking beer 1-2 daily a few days/week.   Hasn't checked blood pressures. Feels like it has been elevated for awhile. No chest pressure, chest pain, headaches. Felt like when he was working heart rate was elevating.     Allergies  Allergen Reactions  . Other     Thimerosal (eyes only)   Current Meds  Medication Sig  . acetaminophen (TYLENOL) 500 MG tablet Take 500 mg by mouth 2 (two) times daily.  [name] buPROPion (WELLBUTRIN XL) 150 MG 24 hr tablet Take 1 tablet (150 mg total) by mouth daily.  [name] buPROPion (WELLBUTRIN XL) 300 MG  24 hr tablet Take 1 tablet (300 mg total) by mouth daily.  [name] CREATINE PO Take 5 g by mouth.  [name] MAGNESIUM GLYCINATE PLUS PO Take 120 mg by mouth 2 (two) times daily.  . meloxicam (MOBIC) 15 MG tablet Take 1 tablet (15 mg total) by mouth daily.  . Multiple Vitamin (MULTI-VITAMIN DAILY PO) Take by mouth.  . Omega 3 1200 MG CAPS Take by mouth.  [name] VITAMIN D PO Take 5,000 Units by mouth daily.  . [DISCONTINUED] buPROPion (WELLBUTRIN XL) 150 MG 24 hr tablet TAKE ONE TABLET BY MOUTH DAILY  . [DISCONTINUED] buPROPion (WELLBUTRIN XL) 300 MG 24 hr tablet TAKE ONE TABLET BY MOUTH DAILY  . [DISCONTINUED] Cholecalciferol (VITAMIN D3) 1.25 MG (50000 UT) CAPS Take by mouth.  . [DISCONTINUED] meloxicam (MOBIC) 7.5 MG tablet TAKE ONE TABLET BY MOUTH DAILY (Patient taking differently: 2 (two) times daily. )    Review of Systems  Constitutional: Negative for chills, fatigue and fever.  Respiratory: Negative for cough, chest tightness, shortness of breath and wheezing.   Cardiovascular: Negative for chest pain, palpitations and leg swelling.  Psychiatric/Behavioral: Positive for sleep disturbance. Negative for agitation.    Objective:  BP (!) 150/84 (BP Location: Right Arm, Patient Position: Sitting, Cuff Size: Large)   Pulse 74   Temp 98.8 F (37.1 C) (Temporal)   Ht 5\' 8"  (1.727 m)   Wt 256 lb 6.4 oz (116.3 kg)   SpO2  97%   BMI 38.99 kg/m   Weight: 256 lb 6.4 oz (116.3 kg)   BP Readings from Last 3 Encounters:  07/16/19 (!) 150/84  02/22/19 136/80  06/29/18 (!) 142/89   Wt Readings from Last 3 Encounters:  07/16/19 256 lb 6.4 oz (116.3 kg)  06/23/18 241 lb 14.4 oz (109.7 kg)  06/02/18 245 lb 8 oz (111.4 kg)    Physical Exam Constitutional:      General: He is not in acute distress.    Appearance: He is well-developed.  Cardiovascular:     Rate and Rhythm: Normal rate and regular rhythm.     Heart sounds: Normal heart sounds. No murmur. No friction rub.  Pulmonary:     Effort:  Pulmonary effort is normal. No respiratory distress.     Breath sounds: Normal breath sounds. No wheezing or rales.  Musculoskeletal:     Right lower leg: No edema.     Left lower leg: No edema.  Neurological:     Mental Status: He is alert and oriented to person, place, and time.  Psychiatric:        Attention and Perception: Attention normal.        Mood and Affect: Mood is depressed.        Behavior: Behavior normal.        Thought Content: Thought content normal.     Assessment/Plan  1. Depression, major, single episode, moderate ([facility]) We will increase wellbutrin back to 450. Feels that he was feeling better at that dose.  - buPROPion (WELLBUTRIN XL) 300 MG 24 hr tablet; Take 1 tablet (300 mg total) by mouth daily.  Dispense: 90 tablet; Refill: 1 - buPROPion (WELLBUTRIN XL) 150 MG 24 hr tablet; Take 1 tablet (150 mg total) by mouth daily.  Dispense: 90 tablet; Refill: 1  2. Other fatigue Checking baseline bloodwork today.  - CBC with Differential/Platelet; Future - Comprehensive metabolic panel; Future - Vitamin B12; Future  3. Vitamin D deficiency Taking supplement. Normal on last check.   4. Need for Tdap vaccination - Tdap vaccine greater than or equal to 7yo IM  5. Ankle pain: s/p surgery. He is doing well and improving, but still needing mobic to help with pain. - meloxicam (MOBIC) 15 MG tablet; Take 1 tablet (15 mg total) by mouth daily.  Dispense: 90 tablet; Refill: 1  7. Essential hypertension Work on 2000 cal limit, restarting exercise program. Will start losartan. Discussed new medication(s) today with patient. Discussed potential side effects and patient verbalized understanding.  - losartan (COZAAR) 25 MG tablet; Take 1 tablet (25 mg total) by mouth daily.  Dispense: 90 tablet; Refill: 1   Return in about 1 month (around 08/13/2019) for physical exam. Time spent on chart review, time with patient; discussion of hypertension, home monitoring with cuff,  depression treatment, follow up plan, and documentation 81min. Micheline Rough, MD

## 2019-07-17 ENCOUNTER — Encounter: Payer: Self-pay | Admitting: Physical Therapy

## 2019-07-17 ENCOUNTER — Ambulatory Visit: Payer: BC Managed Care – PPO | Admitting: Physical Therapy

## 2019-07-17 DIAGNOSIS — M6281 Muscle weakness (generalized): Secondary | ICD-10-CM | POA: Diagnosis not present

## 2019-07-17 DIAGNOSIS — R600 Localized edema: Secondary | ICD-10-CM

## 2019-07-17 DIAGNOSIS — M25572 Pain in left ankle and joints of left foot: Secondary | ICD-10-CM | POA: Diagnosis not present

## 2019-07-17 DIAGNOSIS — M25672 Stiffness of left ankle, not elsewhere classified: Secondary | ICD-10-CM | POA: Diagnosis not present

## 2019-07-17 NOTE — Therapy (Signed)
[facility] [address], [place], [place], [address] Phone: [phone]   Fax:  [phone]  Physical Therapy Treatment  Patient Details  Name: Adam Mcdonald MRN: [id number] Date of Birth: 1966-05-18 Referring Provider (PT): Dr. Tyson Dense    Encounter Date: 07/17/2019  PT End of Session - 07/17/19 1731    Visit Number  20    Number of Visits  36    Date for PT Re-Evaluation  08/07/19    Authorization Type  BCBS  Had previous PT 36 visits left    PT Start Time  1522    PT Stop Time  1613    PT Time Calculation (min)  51 min    Activity Tolerance  Patient tolerated treatment well       Past Medical History:  Diagnosis Date  . Chicken pox   . Depression   . History of MRSA infection   . Sleep apnea   . Ventral hernia without obstruction or gangrene   . Vitamin D deficiency     Past Surgical History:  Procedure Laterality Date  . UVULOPALATOPHARYNGOPLASTY      There were no vitals filed for this visit.  Subjective Assessment - 07/17/19 1520    Subjective  The doctor is really happy.  He says I'm healing in 6 months instead of 1 year.  Took off work restrictions but can modify ladders as needed.  My recovery is going faster now.  After 12 hour work day on Friday, Painful and swollen.  But recovered on Saturday.  Was able to go up and down step stool.    Pertinent History  Surgery 02/16/19 mid March    Patient Stated Goals  ROM, normal walking gait;  I walk slower now with short steps;  walk down steps normally without crab walking;  return to weight lifting goblet squat, dead lifts, TRX has home    Currently in Pain?  Yes    Pain Score  2     Pain Location  Ankle    Pain Orientation  Left                       [facility] Adult PT Treatment/Exercise - 07/17/19 0001      Manual Therapy   Manual therapy comments  distal scar mobilization instrument assisted     Joint Mobilization  grade 3  metatarsal, talocrual mobs 20 sec 5x    Soft tissue mobilization  gastroc/soleus, anterior tib, peri-incisional ;  instrument assissted to gastroc, Achilles, peroneals, posterior tibialis       Ankle Exercises: Machines for Strengthening   Cybex Leg Press  seat 9 40# 2x 10       Ankle Exercises: Standing   SLS  standing on blue pod with right 10 inch step ups 10x     Rocker Board  1 minute   single leg with foot more forward on board   Warrior I  attempted large step over BOSU but unable to do with right; left 5x     Other Standing Ankle Exercises  forward push off on BOSU 10x     Other Standing Ankle Exercises  step ups on blue pod 10x                PT Short Term Goals - 07/10/19 1720      PT SHORT TERM GOAL #1   Title  The patient will demonstrate knowledge of basic self care  including edema control strategies, ROM, low level strengthening    Status  Achieved      PT SHORT TERM GOAL #2   Title  The patient will be able to walk in the grocery store with pain level 5/10 or less    Status  Achieved      PT SHORT TERM GOAL #3   Title  The patient will have improved left ankle DF to 4 degrees, PF to 50 degrees, Inversion and eversion to 18 degrees    Status  Achieved      PT SHORT TERM GOAL #4   Title  The patient will have decreased figure 8 girth measurement on left to 59 cm    Status  Achieved        PT Long Term Goals - 07/10/19 1553      PT LONG TERM GOAL #1   Title  The patient will be independent with safe self progression of HEP    Time  4    Period  Weeks    Status  On-going    Target Date  08/07/19      PT LONG TERM GOAL #2   Title  The patient will be able to walk 20 minutes with pain level 4/10 or less    Status  Achieved      PT LONG TERM GOAL #3   Title  The patient will have improved ankle DF to 8 degrees and PF to 55 degrees needed for greater ease ascending/descending steps at this apartment    Status  Achieved      PT LONG TERM GOAL #5    Title  The patient will be able to single leg stand at least 5 sec, 2/3 trials.    Status  Achieved      PT LONG TERM GOAL #6   Title  FOTO functional outcome score improved from 52% limitation to 37% indicating improved function with less pain    Status  Achieved      PT LONG TERM GOAL #7   Title  Pt will have increased Lt gastroc strength evident by his ability to complete atleast 5 single leg heel raises, which will increase his efficiency with ambulation.    Time  4    Period  Weeks    Status  Revised            Plan - 07/17/19 1731    Clinical Impression Statement  The patient has mild swelling today and scar/fascial changes after a long work day but decreasing pain and scar tenderness.  He reports a very positive doctor visit and he is pleased with his return to function at work including the ability to climb a 2 step ladder.  He is unable to do a standing heel raise of his body weight but he is progressing with calf strengthening overall as well as single leg proprioception.  Will continue to decrease treatment frequency to prepare for discharge in approx 1 month if the majority of goals have been met.    Rehab Potential  Good    PT Frequency  1x / week    PT Duration  4 weeks    PT Treatment/Interventions  ADLs/Self Care Home Management;Cryotherapy;Electrical Stimulation;Iontophoresis 63m/ml Dexamethasone;Therapeutic activities;Therapeutic exercise;Neuromuscular re-education;Manual techniques;Taping;Patient/family education;Ultrasound;Dry needling;Moist Heat    PT Next Visit Plan  plantar flexion strengthening;  single leg rocker board, single leg calf press;  proprioception to help with standing on ladder to work    PT Home Exercise  Plan  Access Code: [email or web address]       Patient will benefit from skilled therapeutic intervention in order to improve the following deficits and impairments:  Abnormal gait, Pain, Decreased range of motion, Decreased activity tolerance, Impaired  perceived functional ability, Decreased strength, Increased edema  Visit Diagnosis: Pain in left ankle and joints of left foot  Stiffness of left ankle, not elsewhere classified  Muscle weakness (generalized)  Localized edema     Problem List Patient Active Problem List   Diagnosis Date Noted  . Low back pain 06/23/2018  . Depression, major, single episode, moderate ([place]) 06/02/2018  . Vitamin D deficiency 06/02/2018   Ruben Im, PT 07/17/19 5:37 PM Phone: [phone] Fax: [phone] Alvera Singh 07/17/2019, 5:36 PM  [facility] [address], [place], [place], [address] Phone: [phone]   Fax:  [phone]  Name: Adam Mcdonald MRN: [id number] Date of Birth: 30-Aug-1965

## 2019-07-24 ENCOUNTER — Other Ambulatory Visit: Payer: Self-pay

## 2019-07-24 ENCOUNTER — Ambulatory Visit: Payer: BC Managed Care – PPO | Admitting: Physical Therapy

## 2019-07-24 DIAGNOSIS — R600 Localized edema: Secondary | ICD-10-CM | POA: Diagnosis not present

## 2019-07-24 DIAGNOSIS — M25672 Stiffness of left ankle, not elsewhere classified: Secondary | ICD-10-CM

## 2019-07-24 DIAGNOSIS — M6281 Muscle weakness (generalized): Secondary | ICD-10-CM | POA: Diagnosis not present

## 2019-07-24 DIAGNOSIS — M25572 Pain in left ankle and joints of left foot: Secondary | ICD-10-CM

## 2019-07-24 NOTE — Therapy (Signed)
[facility] [address], [place], [place], [address] Phone: [phone]   Fax:  [phone]  Physical Therapy Treatment  Patient Details  Name: Adam Mcdonald MRN: [id number] Date of Birth: April 18, 1966 Referring Provider (PT): Dr. Tyson Dense    Encounter Date: 07/24/2019  PT End of Session - 07/24/19 1613    Visit Number  21    Number of Visits  36    Date for PT Re-Evaluation  08/07/19    Authorization Type  BCBS  Had previous PT 36 visits left    PT Start Time  1518    PT Stop Time  1609    PT Time Calculation (min)  51 min    Activity Tolerance  Patient tolerated treatment well       Past Medical History:  Diagnosis Date  . Chicken pox   . Depression   . History of MRSA infection   . Sleep apnea   . Ventral hernia without obstruction or gangrene   . Vitamin D deficiency     Past Surgical History:  Procedure Laterality Date  . UVULOPALATOPHARYNGOPLASTY      There were no vitals filed for this visit.  Subjective Assessment - 07/24/19 1619    Subjective  My ankle did well at work.    Pertinent History  Surgery 02/16/19 mid March    Currently in Pain?  No/denies    Pain Score  0-No pain    Pain Location  Ankle                       [facility] Adult PT Treatment/Exercise - 07/24/19 0001      Knee/Hip Exercises: Standing   SLS  7# single leg dead lift 12x     SLS with Vectors  SLS with 7# overhead press 5x     Other Standing Knee Exercises  squat with right on black foam 6x; splint stance squat 4x       Manual Therapy   Manual therapy comments  distal scar mobilization instrument assisted     Joint Mobilization  grade 3 metatarsal, talocrual mobs 20 sec 5x    Soft tissue mobilization  gastroc/soleus, anterior tib, peri-incisional ;  instrument assissted to gastroc, Achilles, peroneals, posterior tibialis       Ankle Exercises: Machines for Strengthening   Cybex Leg Press  seat 9 40# 2x  12       Ankle Exercises: Standing   Rocker Board  1 minute   single leg with foot more forward on board     Ankle Exercises: Aerobic   Elliptical  90 sec incline 5 x2                PT Short Term Goals - 07/10/19 1720      PT SHORT TERM GOAL #1   Title  The patient will demonstrate knowledge of basic self care including edema control strategies, ROM, low level strengthening    Status  Achieved      PT SHORT TERM GOAL #2   Title  The patient will be able to walk in the grocery store with pain level 5/10 or less    Status  Achieved      PT SHORT TERM GOAL #3   Title  The patient will have improved left ankle DF to 4 degrees, PF to 50 degrees, Inversion and eversion to 18 degrees    Status  Achieved  PT SHORT TERM GOAL #4   Title  The patient will have decreased figure 8 girth measurement on left to 59 cm    Status  Achieved        PT Long Term Goals - 07/10/19 1553      PT LONG TERM GOAL #1   Title  The patient will be independent with safe self progression of HEP    Time  4    Period  Weeks    Status  On-going    Target Date  08/07/19      PT LONG TERM GOAL #2   Title  The patient will be able to walk 20 minutes with pain level 4/10 or less    Status  Achieved      PT LONG TERM GOAL #3   Title  The patient will have improved ankle DF to 8 degrees and PF to 55 degrees needed for greater ease ascending/descending steps at this apartment    Status  Achieved      PT LONG TERM GOAL #5   Title  The patient will be able to single leg stand at least 5 sec, 2/3 trials.    Status  Achieved      PT LONG TERM GOAL #6   Title  FOTO functional outcome score improved from 52% limitation to 37% indicating improved function with less pain    Status  Achieved      PT LONG TERM GOAL #7   Title  Pt will have increased Lt gastroc strength evident by his ability to complete atleast 5 single leg heel raises, which will increase his efficiency with ambulation.    Time   4    Period  Weeks    Status  Revised            Plan - 07/24/19 1614    Clinical Impression Statement  The patient is able to continue with a progression of ankle strengthening and proprioceptive exs.  Although he continues to lack strength for a body weight heel raise, he is able to single leg stand with moderate challenges of surface and movement for short periods of time.  He is able to peform 90 sec increments on the Elliptical with his breathing as the limiting factor rather than his ankle.  His soft tissue mobility continues to improve.  Therapist monitoring response with all interventions.    Rehab Potential  Good    PT Frequency  1x / week    PT Duration  4 weeks    PT Treatment/Interventions  ADLs/Self Care Home Management;Cryotherapy;Electrical Stimulation;Iontophoresis 4mg /ml Dexamethasone;Therapeutic activities;Therapeutic exercise;Neuromuscular re-education;Manual techniques;Taping;Patient/family education;Ultrasound;Dry needling;Moist Heat    PT Next Visit Plan  plantar flexion strengthening;  single leg rocker board, single leg calf press;  proprioception to help with standing on ladder to work    PT Home Exercise Plan  Access Code: [email or web address]       Patient will benefit from skilled therapeutic intervention in order to improve the following deficits and impairments:  Abnormal gait, Pain, Decreased range of motion, Decreased activity tolerance, Impaired perceived functional ability, Decreased strength, Increased edema  Visit Diagnosis: Pain in left ankle and joints of left foot  Stiffness of left ankle, not elsewhere classified  Muscle weakness (generalized)  Localized edema     Problem List Patient Active Problem List   Diagnosis Date Noted  . Low back pain 06/23/2018  . Depression, major, single episode, moderate ([place]) 06/02/2018  . Vitamin D deficiency 06/02/2018  Ruben Im, PT 07/24/19 5:13 PM Phone: [phone] Fax: [phone] Alvera Singh 07/24/2019, 5:13 PM  [facility] [address] [place], [place], [address] Phone: [phone]   Fax:  [phone]  Name: Adam Mcdonald MRN: [id number] Date of Birth: Jul 16, 1965

## 2019-07-31 ENCOUNTER — Other Ambulatory Visit: Payer: Self-pay

## 2019-07-31 ENCOUNTER — Ambulatory Visit: Payer: BC Managed Care – PPO | Admitting: Physical Therapy

## 2019-07-31 ENCOUNTER — Encounter: Payer: Self-pay | Admitting: Physical Therapy

## 2019-07-31 DIAGNOSIS — M6281 Muscle weakness (generalized): Secondary | ICD-10-CM | POA: Diagnosis not present

## 2019-07-31 DIAGNOSIS — M25672 Stiffness of left ankle, not elsewhere classified: Secondary | ICD-10-CM

## 2019-07-31 DIAGNOSIS — M25572 Pain in left ankle and joints of left foot: Secondary | ICD-10-CM | POA: Diagnosis not present

## 2019-07-31 DIAGNOSIS — R600 Localized edema: Secondary | ICD-10-CM | POA: Diagnosis not present

## 2019-07-31 NOTE — Therapy (Signed)
[facility] [address], [place], [place], [address] Phone: [phone]   Fax:  [phone]  Physical Therapy Treatment  Patient Details  Name: Adam Mcdonald MRN: [id number] Date of Birth: 1966/02/25 Referring Provider (PT): Dr. Tyson Dense    Encounter Date: 07/31/2019  PT End of Session - 07/31/19 1609    Visit Number  22    Number of Visits  36    Date for PT Re-Evaluation  08/07/19    Authorization Type  BCBS  Had previous PT 36 visits left    PT Start Time  1520    PT Stop Time  1603    PT Time Calculation (min)  43 min    Activity Tolerance  Patient tolerated treatment well       Past Medical History:  Diagnosis Date  . Chicken pox   . Depression   . History of MRSA infection   . Sleep apnea   . Ventral hernia without obstruction or gangrene   . Vitamin D deficiency     Past Surgical History:  Procedure Laterality Date  . UVULOPALATOPHARYNGOPLASTY      There were no vitals filed for this visit.  Subjective Assessment - 07/31/19 1550    Subjective  I walked on Saturday and it hurt at first but then it got better.    Pertinent History  Surgery 02/16/19 mid March    Currently in Pain?  Yes    Pain Score  2     Pain Orientation  Left                       [facility] Adult PT Treatment/Exercise - 07/31/19 0001      Knee/Hip Exercises: Standing   Other Standing Knee Exercises  squat with right on black foam 6x; splint stance squat 4x     Other Standing Knee Exercises  split squat with heel raises 5x       Manual Therapy   Manual therapy comments  distal scar mobilization instrument assisted     Joint Mobilization  grade 3 metatarsal, talocrual mobs 20 sec 5x    Soft tissue mobilization  gastroc/soleus, anterior tib, peri-incisional ;  instrument assissted to gastroc, Achilles, peroneals, posterior tibialis  Stabilizing ankle with tibial external rotation 10x      Ankle Exercises:  Machines for Strengthening   Cybex Leg Press  seat 9 40# 2x 15; 45# 10x        Ankle Exercises: Standing   Heel Raises  Both;15 reps   eccentric lowers with 75% weight on left 4 counts   Other Standing Ankle Exercises  reverse lunge then push off with toes 10x     Other Standing Ankle Exercises  1/2 kneel with rocking 2x 1 minute followed by isometric toe press and lift 10 sec each 2 sets                PT Short Term Goals - 07/10/19 1720      PT SHORT TERM GOAL #1   Title  The patient will demonstrate knowledge of basic self care including edema control strategies, ROM, low level strengthening    Status  Achieved      PT SHORT TERM GOAL #2   Title  The patient will be able to walk in the grocery store with pain level 5/10 or less    Status  Achieved      PT SHORT TERM GOAL #3  Title  The patient will have improved left ankle DF to 4 degrees, PF to 50 degrees, Inversion and eversion to 18 degrees    Status  Achieved      PT SHORT TERM GOAL #4   Title  The patient will have decreased figure 8 girth measurement on left to 59 cm    Status  Achieved        PT Long Term Goals - 07/10/19 1553      PT LONG TERM GOAL #1   Title  The patient will be independent with safe self progression of HEP    Time  4    Period  Weeks    Status  On-going    Target Date  08/07/19      PT LONG TERM GOAL #2   Title  The patient will be able to walk 20 minutes with pain level 4/10 or less    Status  Achieved      PT LONG TERM GOAL #3   Title  The patient will have improved ankle DF to 8 degrees and PF to 55 degrees needed for greater ease ascending/descending steps at this apartment    Status  Achieved      PT LONG TERM GOAL #5   Title  The patient will be able to single leg stand at least 5 sec, 2/3 trials.    Status  Achieved      PT LONG TERM GOAL #6   Title  FOTO functional outcome score improved from 52% limitation to 37% indicating improved function with less pain     Status  Achieved      PT LONG TERM GOAL #7   Title  Pt will have increased Lt gastroc strength evident by his ability to complete atleast 5 single leg heel raises, which will increase his efficiency with ambulation.    Time  4    Period  Weeks    Status  Revised            Plan - 07/31/19 1610    Clinical Impression Statement  The patient is able to increase resistance, body weight and repetition with ankle plantarflexion although still lacks strength to do a single leg heel raise.  Gastroc lengthening and ankle joint mobility continues to improve.  No verbal cues needed for compensatory toe out during gait.  Improving scar mobility.  Therapist closely monitoring response with all interventions.  Pain level remains 3/10 or less.    Examination-Activity Limitations  Stairs;Lift;Locomotion Level;Stand;Carry;Squat    Examination-Participation Restrictions  Community Activity;Other;Shop;Cleaning    Rehab Potential  Good    PT Frequency  1x / week    PT Duration  4 weeks    PT Treatment/Interventions  ADLs/Self Care Home Management;Cryotherapy;Electrical Stimulation;Iontophoresis 4mg /ml Dexamethasone;Therapeutic activities;Therapeutic exercise;Neuromuscular re-education;Manual techniques;Taping;Patient/family education;Ultrasound;Dry needling;Moist Heat    PT Next Visit Plan  plantar flexion strengthening;  single leg rocker board, single leg calf press;  proprioception to help with standing on ladder to work    PT Home Exercise Plan  Access Code: [email or web address]       Patient will benefit from skilled therapeutic intervention in order to improve the following deficits and impairments:  Abnormal gait, Pain, Decreased range of motion, Decreased activity tolerance, Impaired perceived functional ability, Decreased strength, Increased edema  Visit Diagnosis: Pain in left ankle and joints of left foot  Stiffness of left ankle, not elsewhere classified  Muscle weakness (generalized)     Problem  List Patient Active Problem List  Diagnosis Date Noted  . Low back pain 06/23/2018  . Depression, major, single episode, moderate ([place]) 06/02/2018  . Vitamin D deficiency 06/02/2018   Ruben Im, PT 07/31/19 4:16 PM Phone: [phone] Fax: [phone] Alvera Singh 07/31/2019, 4:15 PM  [facility] [address] [place], [place], [address] Phone: [phone]   Fax:  [phone]  Name: Adam Mcdonald MRN: [id number] Date of Birth: [birth year]

## 2019-08-09 ENCOUNTER — Ambulatory Visit: Payer: BC Managed Care – PPO | Admitting: Physical Therapy

## 2019-08-16 ENCOUNTER — Encounter: Payer: Self-pay | Admitting: Physical Therapy

## 2019-08-16 ENCOUNTER — Other Ambulatory Visit: Payer: Self-pay

## 2019-08-16 ENCOUNTER — Ambulatory Visit: Payer: BC Managed Care – PPO | Attending: Podiatry | Admitting: Physical Therapy

## 2019-08-16 DIAGNOSIS — M25672 Stiffness of left ankle, not elsewhere classified: Secondary | ICD-10-CM | POA: Insufficient documentation

## 2019-08-16 DIAGNOSIS — M6281 Muscle weakness (generalized): Secondary | ICD-10-CM | POA: Diagnosis not present

## 2019-08-16 DIAGNOSIS — R600 Localized edema: Secondary | ICD-10-CM | POA: Insufficient documentation

## 2019-08-16 DIAGNOSIS — M25572 Pain in left ankle and joints of left foot: Secondary | ICD-10-CM | POA: Diagnosis not present

## 2019-08-16 NOTE — Therapy (Signed)
[facility] [address], [place], [place], [address] Phone: [phone]   Fax:  [phone]  Physical Therapy Treatment/Recertification/Discharge Summary  Patient Details  Name: Adam Mcdonald MRN: [id number] Date of Birth: 08/05/1965 Referring Provider (PT): Dr. Tyson Dense    Encounter Date: 08/16/2019  PT End of Session - 08/16/19 1714    Visit Number  23    Number of Visits  36    Date for PT Re-Evaluation  08/16/19    Authorization Type  BCBS  Had previous PT 36 visits left    PT Start Time  1615    PT Stop Time  1703    PT Time Calculation (min)  48 min    Activity Tolerance  Patient tolerated treatment well       Past Medical History:  Diagnosis Date  . Chicken pox   . Depression   . History of MRSA infection   . Sleep apnea   . Ventral hernia without obstruction or gangrene   . Vitamin D deficiency     Past Surgical History:  Procedure Laterality Date  . UVULOPALATOPHARYNGOPLASTY      There were no vitals filed for this visit.  Subjective Assessment - 08/16/19 1616    Subjective  I've been walking a lot at work.  I'm fine for 8 hours but 10-11 hours is tough and I have pain in heel and foot when I go home.  Steps are fine now.    Pertinent History  Surgery 02/16/19 mid March    How long can you walk comfortably?  1 hour    Patient Stated Goals  ROM, normal walking gait;  I walk slower now with short steps;  walk down steps normally without crab walking;  return to weight lifting goblet squat, dead lifts, TRX has home    Currently in Pain?  Yes    Pain Score  2     Pain Location  Ankle    Pain Onset  More than a month ago         [facility] PT Assessment - 08/16/19 0001      AROM   Left Ankle Dorsiflexion  14    Left Ankle Plantar Flexion  60    Left Ankle Inversion  35    Left Ankle Eversion  36      Strength   Left Ankle Dorsiflexion  5/5    Left Ankle Plantar Flexion  4/5    Left Ankle  Inversion  5/5    Left Ankle Eversion  5/5      Special Tests   Other special tests  SLS balance 19 sec                    [facility] Adult PT Treatment/Exercise - 08/16/19 0001      Manual Therapy   Manual therapy comments  distal scar mobilization instrument assisted     Joint Mobilization  grade 3 metatarsal, talocrual mobs 20 sec 5x    Soft tissue mobilization  gastroc/soleus, anterior tib, peri-incisional ;  instrument assissted to gastroc, Achilles, peroneals, posterior tibialis       Ankle Exercises: Seated   Heel Raises Limitations  focus on toe extension     Other Seated Ankle Exercises  inversion and eversion 15x     Other Seated Ankle Exercises  discussion of exercise progression and areas of focus       Ankle Exercises: Standing   SLS  2  attempts     Other Standing Ankle Exercises  tandem stand heel raise with 75% weight on left                PT Short Term Goals - 08/16/19 1721      PT SHORT TERM GOAL #1   Title  The patient will demonstrate knowledge of basic self care including edema control strategies, ROM, low level strengthening    Status  Achieved      PT SHORT TERM GOAL #2   Title  The patient will be able to walk in the grocery store with pain level 5/10 or less    Status  Achieved      PT SHORT TERM GOAL #3   Title  The patient will have improved left ankle DF to 4 degrees, PF to 50 degrees, Inversion and eversion to 18 degrees    Status  Achieved      PT SHORT TERM GOAL #4   Title  The patient will have decreased figure 8 girth measurement on left to 59 cm    Status  Achieved        PT Long Term Goals - 08/16/19 1645      PT LONG TERM GOAL #1   Title  The patient will be independent with safe self progression of HEP    Status  Achieved      PT LONG TERM GOAL #2   Title  The patient will be able to walk 20 minutes with pain level 4/10 or less    Status  Achieved      PT LONG TERM GOAL #3   Title  The patient will have  improved ankle DF to 8 degrees and PF to 55 degrees needed for greater ease ascending/descending steps at this apartment    Status  Achieved      PT LONG TERM GOAL #4   Title  The patient will have improved left ankle/foot strength 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    Baseline  19 sec    Status  Achieved      PT LONG TERM GOAL #6   Title  FOTO functional outcome score improved from 52% limitation to 37% indicating improved function with less pain      PT LONG TERM GOAL #7   Title  Pt will have increased Lt gastroc strength evident by his ability to complete atleast 5 single leg heel raises, which will increase his efficiency with ambulation.    Status  Not Met            Plan - 08/16/19 1714    Clinical Impression Statement  The patient has made excellent progress with ankle/foot ROM, particularly dorsiflexion to 14 degrees.  He is now able to ascend and descend steps reciprocally with ease.  His proprioception has also improved from limited single leg balance of a few seconds to 19 seconds.  He is able to walk for an  hour and he has returned to work full duty.  He reports an 8 hour day is OK but longer days will increase his pain level and he will have an increase in swelling.  Typically he has no limp but after sitting for a while the first few steps are difficult.  His FOTO functional outcome score has significantly improved from >50% limitation to 19%.  His main limitation is plantarflexion weakness 4/5.  He is unable to do a single leg heel raise with 100% body weight but can do assisted heel raises with about 75% body weight.  We discussed that this advanced strengthening will likely take several more months.  He has met the majority of rehab goals.  Recommend discharge from PT at this time.    Examination-Activity  Limitations  Stairs;Lift;Locomotion Level;Stand;Carry;Squat    Examination-Participation Restrictions  Community Activity;Other;Shop;Cleaning    PT Treatment/Interventions  ADLs/Self Care Home Management;Cryotherapy;Electrical Stimulation;Iontophoresis 47m/ml Dexamethasone;Therapeutic activities;Therapeutic exercise;Neuromuscular re-education;Manual techniques;Taping;Patient/family education;Ultrasound;Dry needling;Moist Heat       Patient will benefit from skilled therapeutic intervention in order to improve the following deficits and impairments:  Abnormal gait, Pain, Decreased range of motion, Decreased activity tolerance, Impaired perceived functional ability, Decreased strength, Increased edema  Visit Diagnosis: Pain in left ankle and joints of left foot - Plan: PT plan of care cert/re-cert  Stiffness of left ankle, not elsewhere classified - Plan: PT plan of care cert/re-cert  Muscle weakness (generalized) - Plan: PT plan of care cert/re-cert  Localized edema - Plan: PT plan of care cert/re-cert    PHYSICAL THERAPY DISCHARGE SUMMARY  Visits from Start of Care: 23  Current functional level related to goals / functional outcomes: See clinical impressions above   Remaining deficits: As above   Education / Equipment: HEP Plan: Patient agrees to discharge.  Patient goals were met. Patient is being discharged due to meeting the stated rehab goals.  ?????         Problem List Patient Active Problem List   Diagnosis Date Noted  . Low back pain 06/23/2018  . Depression, major, single episode, moderate ([place] 06/02/2018  . Vitamin D deficiency 06/02/2018   SRuben Im PT 08/16/19 5:24 PM Phone: [phone]: [phone] Singh3/04/2020, 5:23 PM  [facility] [address] [place] [address]Phone: [phone]  Fax:  [phone] Name: DHardie VeltreMRN: [id number] of  Birth: [birth year]

## 2019-08-23 ENCOUNTER — Ambulatory Visit (INDEPENDENT_AMBULATORY_CARE_PROVIDER_SITE_OTHER): Payer: BC Managed Care – PPO | Admitting: Podiatry

## 2019-08-23 ENCOUNTER — Encounter: Payer: Self-pay | Admitting: Podiatry

## 2019-08-23 ENCOUNTER — Other Ambulatory Visit: Payer: Self-pay

## 2019-08-23 DIAGNOSIS — S86012D Strain of left Achilles tendon, subsequent encounter: Secondary | ICD-10-CM | POA: Diagnosis not present

## 2019-08-23 DIAGNOSIS — Z9889 Other specified postprocedural states: Secondary | ICD-10-CM

## 2019-08-23 NOTE — Progress Notes (Signed)
He presents today date of surgery 02/16/2019 status post retrocalcaneal heel spur resection and Achilles tenolysis states that recently has increased in pain to some degree but he is doing more work.  He states that he continues to take the meloxicam and the Tylenol daily.  He is wondering whether or not he has to continue physical therapy or if he can do it on a as needed basis.  Objective: Vital signs are stable he is alert oriented x3.  Pulses are palpable.  The posterior aspect of the surgical leg demonstrates no erythema cellulitis drainage or odor margins of the tendon are nice and clean he has good strong plantarflexion against resistance with no pain on palpation or on plantar flexion.  Assessment: Well-healing surgical foot.  Plan: Follow-up with me in 6 weeks to 64months we are going to continue his limitations particularly for the amount of time during the day program allow him to get back to full duty but for only 10 hours/day.

## 2019-08-30 ENCOUNTER — Other Ambulatory Visit: Payer: Self-pay

## 2019-08-31 ENCOUNTER — Encounter: Payer: Self-pay | Admitting: Family Medicine

## 2019-08-31 ENCOUNTER — Ambulatory Visit (INDEPENDENT_AMBULATORY_CARE_PROVIDER_SITE_OTHER): Payer: BC Managed Care – PPO | Admitting: Family Medicine

## 2019-08-31 VITALS — BP 150/88 | HR 90 | Temp 98.2°F | Ht 68.5 in | Wt 258.0 lb

## 2019-08-31 DIAGNOSIS — I10 Essential (primary) hypertension: Secondary | ICD-10-CM

## 2019-08-31 DIAGNOSIS — F321 Major depressive disorder, single episode, moderate: Secondary | ICD-10-CM

## 2019-08-31 MED ORDER — LOSARTAN POTASSIUM 50 MG PO TABS: 50.0000 mg | ORAL_TABLET | Freq: Every day | ORAL | 1 refills | Status: DC

## 2019-08-31 NOTE — Progress Notes (Signed)
Adam Mcdonald DOB: 08/26/1965 Encounter date: 08/31/2019  This is a 54 y.o. male who presents with Chief Complaint  Patient presents with  . Annual Exam    History of present illness: HTN: hasn't checked at home.   Mood: has been going out and doing things. Joined new gym and is going to start there on Sunday. Looking forward to this.   Feels that sleep is getting better. Stopped afternoon caffeine drink which has helped with sleep. Appetite has increased. This is heaviest he has been.   Foot at end of day is swollen, tender - limited to 10 hours/day. Can still feel shoe tightening as day goes on. Not currently in PT; just waiting to see how he does and can get a "tune up" if needed.   Energy is ok, but between 4-6 extreme drowsiness.   Has decreased alcohol intake to just 3 days/week.   Allergies  Allergen Reactions  . Other     Thimerosal (eyes only)  . Trazodone And Nefazodone     Dry mouth   Current Meds  Medication Sig  . acetaminophen (TYLENOL) 500 MG tablet Take 500 mg by mouth 2 (two) times daily.  [name] buPROPion (WELLBUTRIN XL) 150 MG 24 hr tablet Take 1 tablet (150 mg total) by mouth daily.  [name] buPROPion (WELLBUTRIN XL) 300 MG 24 hr tablet Take 1 tablet (300 mg total) by mouth daily.  [name] CREATINE PO Take 5 g by mouth.  [name] MAGNESIUM GLYCINATE PLUS PO Take 120 mg by mouth 2 (two) times daily.  . meloxicam (MOBIC) 15 MG tablet Take 1 tablet (15 mg total) by mouth daily.  . Multiple Vitamin (MULTI-VITAMIN DAILY PO) Take by mouth.  . Omega 3 1200 MG CAPS Take by mouth.  [name] VITAMIN D PO Take 5,000 Units by mouth daily.  . [DISCONTINUED] losartan (COZAAR) 25 MG tablet Take 1 tablet (25 mg total) by mouth daily.    Review of Systems  Constitutional: Negative for chills, fatigue and fever.  Respiratory: Negative for cough, chest tightness, shortness of breath and wheezing.   Cardiovascular: Negative for chest pain, palpitations and leg swelling.    Objective:  BP (!)  150/88   Pulse 90   Temp 98.2 F (36.8 C) (Temporal)   Ht 5' 8.5" (1.74 m)   Wt 258 lb (117 kg)   SpO2 98%   BMI 38.66 kg/m   Weight: 258 lb (117 kg)   BP Readings from Last 3 Encounters:  08/31/19 (!) 150/88  07/16/19 (!) 150/84  02/22/19 136/80   Wt Readings from Last 3 Encounters:  08/31/19 258 lb (117 kg)  07/16/19 256 lb 6.4 oz (116.3 kg)  06/23/18 241 lb 14.4 oz (109.7 kg)    Physical Exam Constitutional:      General: He is not in acute distress.    Appearance: He is well-developed.  Cardiovascular:     Rate and Rhythm: Normal rate and regular rhythm.     Heart sounds: Normal heart sounds. No murmur. No friction rub.  Pulmonary:     Effort: Pulmonary effort is normal. No respiratory distress.     Breath sounds: Normal breath sounds. No wheezing or rales.  Musculoskeletal:     Right lower leg: No edema.     Left lower leg: No edema.  Neurological:     Mental Status: He is alert and oriented to person, place, and time.  Psychiatric:        Behavior: Behavior normal.  Assessment/Plan  1. Essential hypertension Increase losartan to 50mg  daily. Encouraged home cuff so he can check at home. He has follow up visit in May and we can recheck at that time. He is working on healthier lifestyle choices, and will start with exercise progam prior to next visit. We briefly discussed knowing self (ie don't buy ice cream if difficulty following proper portions).  2. Depression: continue with current medication. I do feel that exercise will be helpful as well for mood. We discussed trying melatonin for insomnia.  Return for for physical; already scheduled.    Micheline Rough, MD

## 2019-09-07 ENCOUNTER — Ambulatory Visit: Payer: BC Managed Care – PPO | Attending: Internal Medicine

## 2019-09-07 DIAGNOSIS — Z23 Encounter for immunization: Secondary | ICD-10-CM

## 2019-09-07 NOTE — Progress Notes (Signed)
   Covid-19 Vaccination Clinic  Name:  Adam Mcdonald    MRN: [id number] DOB: 1965-07-10  09/07/2019  Adam Mcdonald was observed post Covid-19 immunization for 15 minutes without incident. He was provided with Vaccine Information Sheet and instruction to access the V-Safe system.   Adam Mcdonald was instructed to call 911 with any severe reactions post vaccine: [name] Difficulty breathing  . Swelling of face and throat  . A fast heartbeat  . A bad rash all over body  . Dizziness and weakness   Immunizations Administered    Name Date Dose VIS Date Route   Pfizer COVID-19 Vaccine 09/07/2019  8:12 AM 0.3 mL 05/18/2019 Intramuscular   Manufacturer: [employer], [employer]   Lot: [id number]   [place]: [id number]

## 2019-09-17 ENCOUNTER — Ambulatory Visit: Payer: BC Managed Care – PPO | Admitting: Family Medicine

## 2019-10-03 ENCOUNTER — Ambulatory Visit: Payer: BC Managed Care – PPO | Attending: Internal Medicine

## 2019-10-03 DIAGNOSIS — Z23 Encounter for immunization: Secondary | ICD-10-CM

## 2019-10-03 NOTE — Progress Notes (Signed)
   Covid-19 Vaccination Clinic  Name:  Adam Mcdonald    MRN: [id number] DOB: 12-18-1965  10/03/2019  [name] was observed post Covid-19 immunization for 15 minutes without incident. He was provided with Vaccine Information Sheet and instruction to access the V-Safe system.   [name] was instructed to call 911 with any severe reactions post vaccine: [name] Difficulty breathing  . Swelling of face and throat  . A fast heartbeat  . A bad rash all over body  . Dizziness and weakness   Immunizations Administered    Name Date Dose VIS Date Route   Pfizer COVID-19 Vaccine 10/03/2019  8:07 AM 0.3 mL 08/01/2018 Intramuscular   Manufacturer: [place]   Lot: [id number]   [place]: [id number]

## 2019-10-04 ENCOUNTER — Ambulatory Visit (INDEPENDENT_AMBULATORY_CARE_PROVIDER_SITE_OTHER): Payer: BC Managed Care – PPO | Admitting: Podiatry

## 2019-10-04 ENCOUNTER — Other Ambulatory Visit: Payer: Self-pay

## 2019-10-04 ENCOUNTER — Encounter: Payer: Self-pay | Admitting: Podiatry

## 2019-10-04 DIAGNOSIS — S86012D Strain of left Achilles tendon, subsequent encounter: Secondary | ICD-10-CM

## 2019-10-04 DIAGNOSIS — Z9889 Other specified postprocedural states: Secondary | ICD-10-CM

## 2019-10-04 NOTE — Progress Notes (Signed)
He presents today states that he seems to be improving he continues to walk a lot at work and thinks that his work has gotten a lot better he states that I have about 5075% of muscle strength but about 100% of pain-free.  He states that he would like to go ahead and continue take meloxicam as needed and he try to get back to work on a regular basis.  Objective: Vital signs are stable he is alert and oriented x3.  There is no erythema edema cellulitis drainage or odor he is status post retrocalcaneal heel spur resection with Achilles tenolysis and gastroc recession.  Appears to be healing very nicely he has no pain on palpation or range of motion.  Assessment: Well-healing surgical foot.  Plan: I will allow him to get back to work full-time follow-up with me as needed.

## 2019-10-10 ENCOUNTER — Ambulatory Visit (INDEPENDENT_AMBULATORY_CARE_PROVIDER_SITE_OTHER): Payer: BC Managed Care – PPO | Admitting: Family Medicine

## 2019-10-10 ENCOUNTER — Other Ambulatory Visit: Payer: Self-pay

## 2019-10-10 ENCOUNTER — Encounter: Payer: Self-pay | Admitting: Family Medicine

## 2019-10-10 VITALS — BP 124/78 | HR 94 | Temp 98.0°F | Wt 255.4 lb

## 2019-10-10 DIAGNOSIS — R42 Dizziness and giddiness: Secondary | ICD-10-CM

## 2019-10-10 DIAGNOSIS — I10 Essential (primary) hypertension: Secondary | ICD-10-CM

## 2019-10-10 NOTE — Progress Notes (Signed)
Subjective:     Patient ID: Adam Mcdonald, male   DOB: 03/12/1966, 54 y.o.   MRN: [id number]  HPI [name] seen today for the following concerns  Vertigo-like symptoms.  He had episode on 09/20/2019 which lasted several hours and eventually resolved.  He then had recurrence yesterday very similar symptoms.  He described this as feeling "off balance ".  No syncope.  He had some mild vertigo and was not sure if there was a directional component.  He felt like he was "swaying ".  He had some mild nausea but no vomiting.  No headache.  No tinnitus.  Symptoms eventually improved after few hours.  There was no associated focal weakness, visual changes, speech changes such as slurred speech, dysphagia, ataxia.  Denies any recent nasal congestion or sinusitis symptoms.  No ear pain.  No hearing changes.  Second concern is hypertension.  He takes losartan 50 mg daily and compliant with therapy.  He has home monitor and has consistently been in the [id number] systolic and [id number] to 0000000 diastolic.  No recent chest pains.  No regular alcohol use.  He walks a lot with work but no significant walking for exercise.  Past Medical History:  Diagnosis Date  . Chicken pox   . Depression   . History of MRSA infection   . Sleep apnea   . Ventral hernia without obstruction or gangrene   . Vitamin D deficiency    Past Surgical History:  Procedure Laterality Date  . UVULOPALATOPHARYNGOPLASTY      reports that he has never smoked. He has never used smokeless tobacco. He reports current alcohol use. He reports that he does not use drugs. family history includes Depression in his mother; Diabetes in his mother; Heart attack (age of onset: 66) in his paternal grandfather; Heart attack (age of onset: 43) in his paternal grandmother; High blood pressure in his father; Stroke in his maternal grandfather. Allergies  Allergen Reactions  . Other     Thimerosal (eyes only)  . Trazodone And Nefazodone     Dry mouth     Review of  Systems  Constitutional: Negative for fatigue.  HENT: Negative for congestion, ear pain and hearing loss.   Eyes: Negative for visual disturbance.  Respiratory: Negative for cough, chest tightness and shortness of breath.   Cardiovascular: Negative for chest pain, palpitations and leg swelling.  Gastrointestinal: Negative for abdominal pain and vomiting.  Neurological: Positive for dizziness. Negative for seizures, syncope, speech difficulty, weakness, light-headedness and headaches.  Psychiatric/Behavioral: Negative for confusion.       Objective:   Physical Exam Vitals reviewed.  Constitutional:      Appearance: Normal appearance.  HENT:     Ears:     Comments: Left eardrum appears slightly retracted.  Mild erythema.  Minimal cerumen left canal.  Right canal is clear. Eyes:     Extraocular Movements: Extraocular movements intact.     Pupils: Pupils are equal, round, and reactive to light.  Cardiovascular:     Rate and Rhythm: Normal rate and regular rhythm.  Pulmonary:     Effort: Pulmonary effort is normal.     Breath sounds: Normal breath sounds.  Neurological:     General: No focal deficit present.     Mental Status: He is alert.     Cranial Nerves: No cranial nerve deficit.     Motor: No weakness.     Coordination: Coordination normal.     Gait: Gait normal.  Comments: Unable to reproduce any vertigo symptoms with lying supine with head to the right and left  No nystagmus  Psychiatric:        Mood and Affect: Mood normal.        Thought Content: Thought content normal.        Assessment:     #1 hypertension.  Consistently elevated by recent home blood pressure readings but good readings here with 122/68 by nurse and repeat left arm seated after rest 124/78 by me.  #2 recent episodes of dizziness as above.  This sounded more compatible with vertigo.  He does not have any red flags such as speech change, hearing loss, headache, recurrent vomiting, ataxia, focal  weakness    Plan:     -We have asked that he pay attention if he has further episodes of dizziness where there is a directional component.  -We discussed possible Epley maneuvers if he is seeing directional component to his dizziness.  We were unable to reproduce vertigo today in office  -Continue losartan 50 mg daily.  He will consider whether to get a new home blood pressure cuff.  His cuff today was reading about 30 points higher than our cuff  -Discussed nonpharmacologic management of hypertension with weight loss, increased exercise, watch sodium intake  -Follow-up with primary for any recurrent vertigo or any new symptoms  Eulas Post MD [facility]

## 2019-10-10 NOTE — Patient Instructions (Signed)
Vertigo Vertigo is the feeling that you or your surroundings are moving when they are not. This feeling can come and go at any time. Vertigo often goes away on its own. Vertigo can be dangerous if it occurs while you are doing something that could endanger you or others, such as driving or operating machinery. Your health care provider will do tests to determine the cause of your vertigo. Tests will also help your health care provider decide how best to treat your condition. Follow these instructions at home: Eating and drinking      Drink enough fluid to keep your urine pale yellow.  Do not drink alcohol. Activity  Return to your normal activities as told by your health care provider. Ask your health care provider what activities are safe for you.  In the morning, first sit up on the side of the bed. When you feel okay, stand slowly while you hold onto something until you know that your balance is fine.  Move slowly. Avoid sudden body or head movements or certain positions, as told by your health care provider.  If you have trouble walking or keeping your balance, try using a cane for stability. If you feel dizzy or unstable, sit down right away.  Avoid doing any tasks that would cause danger to you or others if vertigo occurs.  Avoid bending down if you feel dizzy. Place items in your home so that they are easy for you to reach without leaning over.  Do not drive or use heavy machinery if you feel dizzy. General instructions  Take over-the-counter and prescription medicines only as told by your health care provider.  Keep all follow-up visits as told by your health care provider. This is important. Contact a health care provider if:  Your medicines do not relieve your vertigo or they make it worse.  You have a fever.  Your condition gets worse or you develop new symptoms.  Your family or friends notice any behavioral changes.  Your nausea or vomiting gets worse.  You  have numbness or a prickling and tingling sensation in part of your body. Get help right away if you:  Have difficulty moving or speaking.  Are always dizzy.  Faint.  Develop severe headaches.  Have weakness in your hands, arms, or legs.  Have changes in your hearing or vision.  Develop a stiff neck.  Develop sensitivity to light. Summary  Vertigo is the feeling that you or your surroundings are moving when they are not.  Your health care provider will do tests to determine the cause of your vertigo.  Follow instructions for home care. You may be told to avoid certain tasks, positions, or movements.  Contact a health care provider if your medicines do not relieve your symptoms, or if you have a fever, nausea, vomiting, or changes in behavior.  Get help right away if you have severe headaches or difficulty speaking, or you develop hearing or vision problems. This information is not intended to replace advice given to you by your health care provider. Make sure you discuss any questions you have with your health care provider. Document Revised: 04/17/2018 Document Reviewed: 04/17/2018 Elsevier Patient Education  2020 [place] blood pressure monitor ([employer]).    Epley maneuvers- home therapy for recurrent vertigo  Pay attention to see if directional component

## 2019-10-23 ENCOUNTER — Other Ambulatory Visit: Payer: Self-pay

## 2019-10-24 ENCOUNTER — Encounter: Payer: Self-pay | Admitting: Family Medicine

## 2019-10-24 ENCOUNTER — Ambulatory Visit (INDEPENDENT_AMBULATORY_CARE_PROVIDER_SITE_OTHER): Payer: BC Managed Care – PPO | Admitting: Family Medicine

## 2019-10-24 VITALS — BP 150/94 | HR 67 | Temp 97.8°F | Ht 69.0 in | Wt 252.7 lb

## 2019-10-24 DIAGNOSIS — I10 Essential (primary) hypertension: Secondary | ICD-10-CM | POA: Diagnosis not present

## 2019-10-24 DIAGNOSIS — Z13220 Encounter for screening for lipoid disorders: Secondary | ICD-10-CM | POA: Diagnosis not present

## 2019-10-24 DIAGNOSIS — Z23 Encounter for immunization: Secondary | ICD-10-CM | POA: Diagnosis not present

## 2019-10-24 DIAGNOSIS — F321 Major depressive disorder, single episode, moderate: Secondary | ICD-10-CM

## 2019-10-24 DIAGNOSIS — Z0000 Encounter for general adult medical examination without abnormal findings: Secondary | ICD-10-CM | POA: Diagnosis not present

## 2019-10-24 DIAGNOSIS — M545 Low back pain, unspecified: Secondary | ICD-10-CM

## 2019-10-24 DIAGNOSIS — G8929 Other chronic pain: Secondary | ICD-10-CM

## 2019-10-24 LAB — COMPREHENSIVE METABOLIC PANEL
ALT: 80 U/L — ABNORMAL HIGH (ref 0–53)
AST: 40 U/L — ABNORMAL HIGH (ref 0–37)
Albumin: 4.8 g/dL (ref 3.5–5.2)
Alkaline Phosphatase: 69 U/L (ref 39–117)
BUN: 14 mg/dL (ref 6–23)
CO2: 26 mEq/L (ref 19–32)
Calcium: 9.5 mg/dL (ref 8.4–10.5)
Chloride: 102 mEq/L (ref 96–112)
Creatinine, Ser: 0.99 mg/dL (ref 0.40–1.50)
GFR: 79.4 mL/min (ref >60.00)
Glucose, Bld: 98 mg/dL (ref 70–99)
Potassium: 4.1 mEq/L (ref 3.5–5.1)
Sodium: 136 mEq/L (ref 135–145)
Total Bilirubin: 0.6 mg/dL (ref 0.2–1.2)
Total Protein: 7.8 g/dL (ref 6.0–8.3)

## 2019-10-24 LAB — CBC WITH DIFFERENTIAL/PLATELET
Basophils Absolute: 0 10*3/uL (ref 0.0–0.1)
Basophils Relative: 0.5 % (ref 0.0–3.0)
Eosinophils Absolute: 0.1 10*3/uL (ref 0.0–0.7)
Eosinophils Relative: 1.9 % (ref 0.0–5.0)
HCT: 51 % (ref 39.0–52.0)
Hemoglobin: 17.6 g/dL — ABNORMAL HIGH (ref 13.0–17.0)
Lymphocytes Relative: 33.6 % (ref 12.0–46.0)
Lymphs Abs: 2.4 10*3/uL (ref 0.7–4.0)
MCHC: 34.4 g/dL (ref 30.0–36.0)
MCV: 91.5 fl (ref 78.0–100.0)
Monocytes Absolute: 0.6 10*3/uL (ref 0.1–1.0)
Monocytes Relative: 9 % (ref 3.0–12.0)
Neutro Abs: 4 10*3/uL (ref 1.4–7.7)
Neutrophils Relative %: 55 % (ref 43.0–77.0)
Platelets: 286 10*3/uL (ref 150.0–400.0)
RBC: 5.58 Mil/uL (ref 4.22–5.81)
RDW: 13.2 % (ref 11.5–15.5)
WBC: 7.2 10*3/uL (ref 4.0–10.5)

## 2019-10-24 LAB — LIPID PANEL
Cholesterol: 168 mg/dL (ref 0–200)
HDL: 35.1 mg/dL — ABNORMAL LOW (ref >39.00)
LDL Cholesterol: 98 mg/dL (ref 0–99)
NonHDL: 133.31
Total CHOL/HDL Ratio: 5
Triglycerides: 175 mg/dL — ABNORMAL HIGH (ref 0.0–149.0)
VLDL: 35 mg/dL (ref 0.0–40.0)

## 2019-10-24 MED ORDER — DULOXETINE HCL 20 MG PO CPEP: 20.0000 mg | ORAL_CAPSULE | Freq: Every day | ORAL | 1 refills | Status: DC

## 2019-10-24 NOTE — Patient Instructions (Addendum)
Consider miralax for constipation.   Make sure keeping up with fluids to help with constipation.   Please update me in 2 weeks time through mychart to let me know how mood is doing as well as blood pressures and back pain. If you need a refill of losartan let me know.   Increase losartan to 2 tablets (100mg ) daily.

## 2019-10-24 NOTE — Progress Notes (Signed)
Adam Mcdonald DOB: 1965/08/31 Encounter date: 10/24/2019  This is a 54 y.o. male who presents for complete physical   History of present illness/Additional concerns: Still struggling with healing from achilles tendon surgery - just has pain through day; sometimes limping.   Depression has not been great; gaining weight and not exercising which causes increased back pain. Just not motivated to do things. Has been on prozac, paxil, zoloft, risperdal, ritalin, trazodone, remeron.   Hasn't had any more episodes of elevated blood pressures.   Pressures 167/100, 160/100, 153/79, 146/78, 147/85, 138/83, 139/87, 147/86, 155/84, 146/83, 147/97, 139/96 at home. None have been low. Got a new cuff, still running high.   (5/4: 167/102 prior to visit with Dr. Elease Hashimoto, 150/97; 167/102 was day he had dizzy spell)  Doesn't keep soda in house any more. Basically water, tea, coffee, zero cal gatorade.   Has had a few years of issue with constipation. Was managing with metamucil, but end of April got really bloated and tried stool softener - taking 3 tablets of docusate at bedtime.   Melatonin is helping with sleep - staying asleep until 4:30.   cologuard negative from 06/2018  Past Medical History:  Diagnosis Date  . Chicken pox   . Depression   . History of MRSA infection   . Sleep apnea   . Ventral hernia without obstruction or gangrene   . Vitamin D deficiency    Past Surgical History:  Procedure Laterality Date  . UVULOPALATOPHARYNGOPLASTY     Allergies  Allergen Reactions  . Other     Thimerosal (eyes only)  . Trazodone And Nefazodone     Dry mouth   Current Meds  Medication Sig  . acetaminophen (TYLENOL) 500 MG tablet Take 500 mg by mouth 2 (two) times daily.  [name] buPROPion (WELLBUTRIN XL) 150 MG 24 hr tablet Take 1 tablet (150 mg total) by mouth daily.  [name] buPROPion (WELLBUTRIN XL) 300 MG 24 hr tablet Take 1 tablet (300 mg total) by mouth daily.  [name] CREATINE PO Take 5 g by mouth.  [name] Sodium 100 MG capsule Take 300 mg by mouth at bedtime.  [name] losartan (COZAAR) 50 MG tablet Take 1 tablet (50 mg total) by mouth daily.  [name] MAGNESIUM GLYCINATE PLUS PO Take 120 mg by mouth 2 (two) times daily.  [name] MELATONIN PO Take 5 mg by mouth at bedtime.  . meloxicam (MOBIC) 15 MG tablet Take 1 tablet (15 mg total) by mouth daily.  . Multiple Vitamin (MULTI-VITAMIN DAILY PO) Take by mouth.  . Omega 3 1200 MG CAPS Take by mouth.  [name] VITAMIN D PO Take 5,000 Units by mouth daily.   Social History   Tobacco Use  . Smoking status: Never Smoker  . Smokeless tobacco: Never Used  Substance Use Topics  . Alcohol use: Yes    Comment: occasional   Family History  Problem Relation Age of Onset  . Diabetes Mother   . Depression Mother   . High blood pressure Father   . Stroke Maternal Grandfather   . Heart attack Paternal Grandmother 70  . Heart attack Paternal Grandfather 50     Review of Systems  CBC:  Lab Results  Component Value Date   WBC 6.9 07/16/2019   HGB 17.2 (H) 07/16/2019   HCT 49.3 07/16/2019   MCHC 34.9 07/16/2019   RDW 13.5 07/16/2019   PLT 276.0 07/16/2019   CMP: Lab Results  Component Value Date   NA 137 07/16/2019  K 4.4 07/16/2019   CL 101 07/16/2019   CO2 27 07/16/2019   GLUCOSE 85 07/16/2019   BUN 14 07/16/2019   CREATININE 0.92 07/16/2019   CALCIUM 9.6 07/16/2019   PROT 7.3 07/16/2019   BILITOT 0.6 07/16/2019   ALKPHOS 70 07/16/2019   ALT 58 (H) 07/16/2019   AST 36 07/16/2019   LIPID: Lab Results  Component Value Date   CHOL 163 06/13/2018   TRIG 168.0 (H) 06/13/2018   HDL 36.60 (L) 06/13/2018   LDLCALC 92 06/13/2018    Objective:  BP (!) 150/94   Pulse 67   Temp 97.8 F (36.6 C) (Temporal)   Ht 5\' 9"  (1.753 m)   Wt 252 lb 11.2 oz (114.6 kg)   SpO2 96%   BMI 37.32 kg/m   Weight: 252 lb 11.2 oz (114.6 kg)   BP Readings from Last 3 Encounters:  10/24/19 (!) 150/94  10/10/19 124/78  08/31/19 (!) 150/88   Wt Readings from  Last 3 Encounters:  10/24/19 252 lb 11.2 oz (114.6 kg)  10/10/19 255 lb 6.4 oz (115.8 kg)  08/31/19 258 lb (117 kg)    Physical Exam Constitutional:      General: He is not in acute distress.    Appearance: He is well-developed.  HENT:     Head: Normocephalic and atraumatic.     Right Ear: External ear normal.     Left Ear: External ear normal.     Nose: Nose normal.     Mouth/Throat:     Pharynx: No oropharyngeal exudate.  Eyes:     Conjunctiva/sclera: Conjunctivae normal.     Pupils: Pupils are equal, round, and reactive to light.  Neck:     Thyroid: No thyromegaly.  Cardiovascular:     Rate and Rhythm: Normal rate and regular rhythm.     Heart sounds: Normal heart sounds. No murmur. No friction rub. No gallop.   Pulmonary:     Effort: Pulmonary effort is normal. No respiratory distress.     Breath sounds: Normal breath sounds. No stridor. No wheezing or rales.  Abdominal:     General: Bowel sounds are normal.     Palpations: Abdomen is soft.  Musculoskeletal:        General: Normal range of motion.     Cervical back: Neck supple.  Skin:    General: Skin is warm and dry.  Neurological:     Mental Status: He is alert and oriented to person, place, and time.  Psychiatric:        Behavior: Behavior normal.        Thought Content: Thought content normal.        Judgment: Judgment normal.     Assessment/Plan: There are no preventive care reminders to display for this patient. Health Maintenance reviewed - up to date with preventative care. Work on that daily exercise.  1. Preventative health care Goal is daily exercise. He is really going to strive for this.   2. Essential hypertension Still higher than desired. We are going to increase losartan to 100mg  daily.  He will let me know how blood pressures look in 2 weeks time with a MyChart update. - CBC with Differential/Platelet; Future - Comprehensive metabolic panel; Future  3. Need for shingles  vaccine Completed today in the office. - Varicella-zoster vaccine IM (Shingrix)  4. Depression, major, single episode, moderate ([place]) After discussion of potential options with depression, we elected to add on Cymbalta.  He is worried about sexual  side effects of this medication, so we will need to monitor for this.  He has tried multiple other medications in the past and tried therapy and has always struggled with depression.  I do feel the daily exercise is the best way to help him combat depression, but hopeful that a low dose of Cymbalta will also help with mood as well as chronic back pain. - DULoxetine (CYMBALTA) 20 MG capsule; Take 1 capsule (20 mg total) by mouth daily.  Dispense: 90 capsule; Refill: 1  5. Chronic low back pain without sciatica, unspecified back pain laterality See above.  This is an ongoing issue for him.  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 - DULoxetine (CYMBALTA) 20 MG capsule; Take 1 capsule (20 mg total) by mouth daily.  Dispense: 90 capsule; Refill: 1  6. Lipid screening - Lipid panel; Future  Return in about 1 month (around 11/24/2019) for blood pressure recheck/mood recheck.  Micheline Rough, MD

## 2019-10-24 NOTE — Addendum Note (Signed)
Addended by: Elmer Picker on: 10/24/2019 09:33 AM   Modules accepted: Orders

## 2019-11-06 ENCOUNTER — Encounter: Payer: Self-pay | Admitting: Family Medicine

## 2019-11-07 ENCOUNTER — Encounter: Payer: Self-pay | Admitting: Family Medicine

## 2019-11-07 ENCOUNTER — Other Ambulatory Visit: Payer: Self-pay | Admitting: Family Medicine

## 2019-11-07 MED ORDER — LOSARTAN POTASSIUM 100 MG PO TABS: 100.0000 mg | ORAL_TABLET | Freq: Every day | ORAL | 1 refills | Status: DC

## 2019-11-07 MED ORDER — LOSARTAN POTASSIUM 100 MG PO TABS
50.0000 mg | ORAL_TABLET | Freq: Every day | ORAL | 1 refills | Status: DC
Start: 2019-11-07 — End: 2019-11-07

## 2019-11-07 NOTE — Telephone Encounter (Signed)
Addressed in mychart message

## 2019-11-20 ENCOUNTER — Encounter: Payer: Self-pay | Admitting: Family Medicine

## 2019-11-20 DIAGNOSIS — R748 Abnormal levels of other serum enzymes: Secondary | ICD-10-CM

## 2019-11-21 MED ORDER — LOSARTAN POTASSIUM-HCTZ 100-25 MG PO TABS: 1.0000 | ORAL_TABLET | Freq: Every day | ORAL | 1 refills | Status: DC

## 2019-11-21 NOTE — Telephone Encounter (Signed)
Please set up lab visit (make sure we have someone in lab that day for him ;)

## 2019-11-21 NOTE — Telephone Encounter (Signed)
FYI  Please advise on having test done.

## 2019-11-22 NOTE — Telephone Encounter (Signed)
Lab appointment scheduled 

## 2019-11-23 ENCOUNTER — Other Ambulatory Visit: Payer: Self-pay

## 2019-11-26 ENCOUNTER — Other Ambulatory Visit (INDEPENDENT_AMBULATORY_CARE_PROVIDER_SITE_OTHER): Payer: BC Managed Care – PPO

## 2019-11-26 ENCOUNTER — Other Ambulatory Visit: Payer: Self-pay

## 2019-11-26 DIAGNOSIS — R748 Abnormal levels of other serum enzymes: Secondary | ICD-10-CM

## 2019-11-26 LAB — GAMMA GT: GGT: 50 U/L (ref 7–51)

## 2019-11-26 LAB — HEPATIC FUNCTION PANEL
ALT: 83 U/L — ABNORMAL HIGH (ref 0–53)
AST: 41 U/L — ABNORMAL HIGH (ref 0–37)
Albumin: 4.7 g/dL (ref 3.5–5.2)
Alkaline Phosphatase: 70 U/L (ref 39–117)
Bilirubin, Direct: 0.1 mg/dL (ref 0.0–0.3)
Total Bilirubin: 0.7 mg/dL (ref 0.2–1.2)
Total Protein: 7.4 g/dL (ref 6.0–8.3)

## 2019-11-27 LAB — HEPATITIS PANEL, ACUTE
Hep A IgM: NONREACTIVE
Hep B C IgM: NONREACTIVE
Hepatitis B Surface Ag: NONREACTIVE
Hepatitis C Ab: NONREACTIVE
SIGNAL TO CUT-OFF: 0 (ref <1.00)

## 2019-11-29 NOTE — Addendum Note (Signed)
Addended by: Agnes Lawrence on: 11/29/2019 02:58 PM   Modules accepted: Orders

## 2019-12-03 ENCOUNTER — Ambulatory Visit: Payer: BC Managed Care – PPO | Admitting: Family Medicine

## 2019-12-03 ENCOUNTER — Encounter: Payer: Self-pay | Admitting: Family Medicine

## 2019-12-03 ENCOUNTER — Other Ambulatory Visit: Payer: Self-pay

## 2019-12-03 VITALS — BP 104/70 | HR 88 | Temp 97.7°F | Ht 69.0 in | Wt 253.1 lb

## 2019-12-03 DIAGNOSIS — I10 Essential (primary) hypertension: Secondary | ICD-10-CM | POA: Diagnosis not present

## 2019-12-03 DIAGNOSIS — F339 Major depressive disorder, recurrent, unspecified: Secondary | ICD-10-CM | POA: Diagnosis not present

## 2019-12-03 MED ORDER — DULOXETINE HCL 60 MG PO CPEP: 60.0000 mg | ORAL_CAPSULE | Freq: Every day | ORAL | 1 refills | Status: DC

## 2019-12-03 NOTE — Progress Notes (Addendum)
Adam Mcdonald DOB: [birth year] Encounter date: 12/03/2019  This is a 54 y.o. male who presents with Chief Complaint  Patient presents with  . Follow-up    History of present illness: (At last visit, we discussed worsening mood as well as back pain and added on Cymbalta to try and help with this.  Additionally, blood pressure was not optimally controlled and losartan was increased to 100 mg daily. Last night bp was 140/82 and has been around the mid 130's-140's over 70-90. (home cuff 136/81 in office today). No issues with feeling light headed or dizzy. Does feel like he is urinating more and feeling more thirsty with the hctz. Is disruptive to his sleep - now waking 2 times/night.   Liver enzymes were elevated on recent blood work, and abdominal ultrasound is pending.)  Last week has been really great for him. Really feels like switch turned. Feels like he is handling stress better at work, actually smiling, pain seems to be improving.   Still with ankle pain, but not affected work.    Allergies  Allergen Reactions  . Other     Thimerosal (eyes only)  . Trazodone And Nefazodone     Dry mouth   Current Meds  Medication Sig  . acetaminophen (TYLENOL) 500 MG tablet Take 500 mg by mouth 2 (two) times daily.  [name] buPROPion (WELLBUTRIN XL) 150 MG 24 hr tablet Take 1 tablet (150 mg total) by mouth daily.  [name] buPROPion (WELLBUTRIN XL) 300 MG 24 hr tablet Take 1 tablet (300 mg total) by mouth daily.  [name] CREATINE PO Take 5 g by mouth.  [name] Sodium 100 MG capsule Take 300 mg by mouth at bedtime.  . DULoxetine (CYMBALTA) 20 MG capsule Take 1 capsule (20 mg total) by mouth daily.  [name] losartan-hydrochlorothiazide (HYZAAR) 100-25 MG tablet Take 1 tablet by mouth daily.  [name] MAGNESIUM GLYCINATE PLUS PO Take 120 mg by mouth 2 (two) times daily.  [name] MELATONIN PO Take 5 mg by mouth at bedtime.  . meloxicam (MOBIC) 15 MG tablet Take 1 tablet (15 mg total) by mouth daily.  . Multiple Vitamin  (MULTI-VITAMIN DAILY PO) Take by mouth.  . Omega 3 1200 MG CAPS Take by mouth.  [name] VITAMIN D PO Take 5,000 Units by mouth daily.    Review of Systems  Constitutional: Negative for chills, fatigue and fever.  Respiratory: Negative for cough, chest tightness, shortness of breath and wheezing.   Cardiovascular: Negative for chest pain, palpitations and leg swelling.    Objective:  BP 104/70 (BP Location: Left Arm, Patient Position: Sitting, Cuff Size: Large)   Pulse 88   Temp 97.7 F (36.5 C) (Temporal)   Ht 5\' 9"  (1.753 m)   Wt 253 lb 1.6 oz (114.8 kg)   SpO2 97%   BMI 37.38 kg/m   Weight: 253 lb 1.6 oz (114.8 kg)   BP Readings from Last 3 Encounters:  12/03/19 104/70  10/24/19 (!) 150/94  10/10/19 124/78   Wt Readings from Last 3 Encounters:  12/03/19 253 lb 1.6 oz (114.8 kg)  10/24/19 252 lb 11.2 oz (114.6 kg)  10/10/19 255 lb 6.4 oz (115.8 kg)    Physical Exam Constitutional:      General: He is not in acute distress.    Appearance: He is well-developed.  Cardiovascular:     Rate and Rhythm: Normal rate and regular rhythm.     Heart sounds: Normal heart sounds. No murmur heard.  No friction rub.  Pulmonary:  Effort: Pulmonary effort is normal. No respiratory distress.     Breath sounds: Normal breath sounds. No wheezing or rales.  Musculoskeletal:     Right lower leg: No edema.     Left lower leg: No edema.  Neurological:     Mental Status: He is alert and oriented to person, place, and time.  Psychiatric:        Behavior: Behavior normal.     Assessment/Plan  1. Essential hypertension Improved control. Continue current medication: losartan-hctz 100-25. Home cuff over-reads. He will update me with how he does with evening fluid restriction. If still waking frequently to urinate consider dropping hctz component.  2. Depression, recurrent (HCC) Mood has improved. Happy with med; although some erectile dysfunction. [place] with continuing med; will check  testosterone level with future bloodwork. Would like to cut back on wellbutrin since not much improvement from 300 to 450 dose. We will cut back to 300mg  wellbutrin and increase the cymbalta to 60mg . Discussed side effects with this and patient will let me know how he does with this. - DULoxetine (CYMBALTA) 60 MG capsule; Take 1 capsule (60 mg total) by mouth daily.  Dispense: 90 capsule; Refill: 1  Followup: bloodwork followed by [facility] in 6 months.     Micheline Rough, MD

## 2019-12-17 ENCOUNTER — Ambulatory Visit
Admission: RE | Admit: 2019-12-17 | Discharge: 2019-12-17 | Disposition: A | Discharge status: Home / Self Care | Payer: BC Managed Care – PPO | Source: Ambulatory Visit | Attending: Family Medicine | Admitting: Family Medicine

## 2019-12-17 DIAGNOSIS — R748 Abnormal levels of other serum enzymes: Secondary | ICD-10-CM

## 2019-12-17 DIAGNOSIS — K760 Fatty (change of) liver, not elsewhere classified: Secondary | ICD-10-CM | POA: Diagnosis not present

## 2020-01-17 ENCOUNTER — Other Ambulatory Visit: Payer: Self-pay | Admitting: Family Medicine

## 2020-01-17 DIAGNOSIS — M25572 Pain in left ankle and joints of left foot: Secondary | ICD-10-CM

## 2020-01-17 DIAGNOSIS — F321 Major depressive disorder, single episode, moderate: Secondary | ICD-10-CM

## 2020-01-17 DIAGNOSIS — G8929 Other chronic pain: Secondary | ICD-10-CM

## 2020-01-22 ENCOUNTER — Emergency Department (HOSPITAL_COMMUNITY): Payer: BC Managed Care – PPO

## 2020-01-22 ENCOUNTER — Emergency Department (HOSPITAL_COMMUNITY)
Admission: EM | Admit: 2020-01-22 | Discharge: 2020-01-22 | Disposition: A | Discharge status: Home / Self Care | Payer: BC Managed Care – PPO | Attending: Emergency Medicine | Admitting: Emergency Medicine

## 2020-01-22 ENCOUNTER — Other Ambulatory Visit: Payer: Self-pay

## 2020-01-22 DIAGNOSIS — Z5321 Procedure and treatment not carried out due to patient leaving prior to being seen by health care provider: Secondary | ICD-10-CM | POA: Insufficient documentation

## 2020-01-22 DIAGNOSIS — Y999 Unspecified external cause status: Secondary | ICD-10-CM | POA: Diagnosis not present

## 2020-01-22 DIAGNOSIS — Y9289 Other specified places as the place of occurrence of the external cause: Secondary | ICD-10-CM | POA: Diagnosis not present

## 2020-01-22 DIAGNOSIS — M79642 Pain in left hand: Secondary | ICD-10-CM | POA: Diagnosis not present

## 2020-01-22 DIAGNOSIS — X500XXA Overexertion from strenuous movement or load, initial encounter: Secondary | ICD-10-CM | POA: Diagnosis not present

## 2020-01-22 DIAGNOSIS — Y9389 Activity, other specified: Secondary | ICD-10-CM | POA: Diagnosis not present

## 2020-01-22 DIAGNOSIS — S60922A Unspecified superficial injury of left hand, initial encounter: Secondary | ICD-10-CM | POA: Insufficient documentation

## 2020-01-22 NOTE — ED Triage Notes (Signed)
Pt is here with left hand injury last Wednesday where a his hand was caught under something that weighted about 105 pounds at work.  Pt has some healed lacs to hands and continues to have pain, especially the middle finger.

## 2020-01-22 NOTE — ED Notes (Signed)
Pt states his company called and informed him he must be seen at [facility], he is leaving to go there.

## 2020-02-18 DIAGNOSIS — H18592 Other hereditary corneal dystrophies, left eye: Secondary | ICD-10-CM | POA: Diagnosis not present

## 2020-04-14 ENCOUNTER — Other Ambulatory Visit: Payer: Self-pay

## 2020-04-14 ENCOUNTER — Ambulatory Visit: Payer: BC Managed Care – PPO | Admitting: Family Medicine

## 2020-04-14 ENCOUNTER — Encounter: Payer: Self-pay | Admitting: Family Medicine

## 2020-04-14 VITALS — BP 130/78 | HR 87 | Temp 98.0°F | Ht 67.5 in | Wt 244.8 lb

## 2020-04-14 DIAGNOSIS — F339 Major depressive disorder, recurrent, unspecified: Secondary | ICD-10-CM | POA: Diagnosis not present

## 2020-04-14 DIAGNOSIS — Z23 Encounter for immunization: Secondary | ICD-10-CM

## 2020-04-14 DIAGNOSIS — R6882 Decreased libido: Secondary | ICD-10-CM | POA: Diagnosis not present

## 2020-04-14 DIAGNOSIS — I10 Essential (primary) hypertension: Secondary | ICD-10-CM

## 2020-04-14 DIAGNOSIS — G4730 Sleep apnea, unspecified: Secondary | ICD-10-CM

## 2020-04-14 DIAGNOSIS — R748 Abnormal levels of other serum enzymes: Secondary | ICD-10-CM | POA: Diagnosis not present

## 2020-04-14 NOTE — Addendum Note (Signed)
Addended by: Marrion Coy on: 04/14/2020 09:12 AM   Modules accepted: Orders

## 2020-04-14 NOTE — Progress Notes (Signed)
Adam Mcdonald DOB: [birth year] Encounter date: 04/14/2020  This is a 54 y.o. male who presents with Chief Complaint  Patient presents with  . Follow-up    History of present illness: Last visit with me was 6/28. Doing pretty well overall. Just wanted to touch base on things.   Has some info on sleep eval - got two sleep studies from [place] - second sleep study doc didn't adjust sleep settings to what study recommended. First study was at 69; second recommended 10 because he lost 11 REM. Has been complaining of fatigue, sleepiness for years. Saw NP in followup and CPAP was bumped up to 15. He dialed down CPAP to 10 and still has drowsiness but is feeling better. Just not sure why doc didn't titrate back with second study. He is very compliant with CPAP. Gradually titrated self down from 15 to 10. Using nasal pillow. Has been on the 10 for a couple of weeks now.   Has been working on eating patterns - trying different things like intermittent fasting. Working to get 1800 cal. Has been supplementing with protein shakes as well - synthe-6 -which has carbs, fats, protein. Not just whey protein.   Hypertension: At last visit we continued losartan-hydrochlorothiazide 100-25.   Depression: Last visit we increase his Cymbalta to 60 mg and cut back on Wellbutrin to 300 mg. Mood has been doing well on this.   Allergies  Allergen Reactions  . Other     Thimerosal (eyes only)  . Trazodone And Nefazodone     Dry mouth   Current Meds  Medication Sig  . acetaminophen (TYLENOL) 500 MG tablet Take 500 mg by mouth 2 (two) times daily.  [name] buPROPion (WELLBUTRIN XL) 300 MG 24 hr tablet TAKE ONE TABLET BY MOUTH DAILY  . CREATINE PO Take 5 g by mouth.  [name] Sodium 100 MG capsule Take 300 mg by mouth at bedtime.  . DULoxetine (CYMBALTA) 60 MG capsule Take 1 capsule (60 mg total) by mouth daily.  [name] losartan-hydrochlorothiazide (HYZAAR) 100-25 MG tablet Take 1 tablet by mouth daily.  [name] MAGNESIUM  GLYCINATE PLUS PO Take 120 mg by mouth 2 (two) times daily.  [name] MELATONIN PO Take 5 mg by mouth at bedtime.  . meloxicam (MOBIC) 15 MG tablet TAKE ONE TABLET BY MOUTH DAILY  . Multiple Vitamin (MULTI-VITAMIN DAILY PO) Take by mouth.  . Omega 3 1200 MG CAPS Take by mouth.  [name] VITAMIN D PO Take 5,000 Units by mouth daily.    Review of Systems  Constitutional: Negative for chills, fatigue and fever.  Respiratory: Negative for cough, chest tightness, shortness of breath and wheezing.   Cardiovascular: Negative for chest pain, palpitations and leg swelling.  Psychiatric/Behavioral:       Mood doing better.     Objective:  BP 130/78 (BP Location: Left Arm, Patient Position: Sitting, Cuff Size: Large)   Pulse 87   Temp 98 F (36.7 C) (Oral)   Ht 5' 7.5" (1.715 m)   Wt 244 lb 12.8 oz (111 kg)   BMI 37.78 kg/m   Weight: 244 lb 12.8 oz (111 kg)   BP Readings from Last 3 Encounters:  04/14/20 130/78  01/22/20 (!) 134/98  12/03/19 104/70   Wt Readings from Last 3 Encounters:  04/14/20 244 lb 12.8 oz (111 kg)  01/22/20 250 lb (113.4 kg)  12/03/19 253 lb 1.6 oz (114.8 kg)    Physical Exam Constitutional:      General: He is not in  acute distress.    Appearance: He is well-developed.  Cardiovascular:     Rate and Rhythm: Normal rate and regular rhythm.     Heart sounds: Normal heart sounds. No murmur heard.  No friction rub.  Pulmonary:     Effort: Pulmonary effort is normal. No respiratory distress.     Breath sounds: Normal breath sounds. No wheezing or rales.  Musculoskeletal:     Right lower leg: No edema.     Left lower leg: No edema.  Neurological:     Mental Status: He is alert and oriented to person, place, and time.  Psychiatric:        Behavior: Behavior normal.      Office Visit from 12/03/2019 in [place] at [facility] Total Score 4       Assessment/Plan  1. Depression, recurrent ([place]) Stable with current wellbutrin 300mg  and cymbalta  60mg .  2. Primary hypertension Stable on current medications - continue with losartan-hctz 100-25 daily. Continue with weight loss; plans to start with exercise.  3. Elevated liver enzymes Recheck today. - Hepatic function panel; Future  4. Decreased libido - Testosterone; Future  5. Sleep apnea, unspecified type Continue with CPAP at 10 pressure. Tolerating this better.    Return in about 3 months (around 07/15/2020) for Chronic condition visit.     Micheline Rough, MD

## 2020-04-14 NOTE — Patient Instructions (Signed)
[email or web address]  

## 2020-04-14 NOTE — Addendum Note (Signed)
Addended by: Agnes Lawrence on: 04/14/2020 09:13 AM   Modules accepted: Orders

## 2020-04-15 LAB — HEPATIC FUNCTION PANEL
AG Ratio: 1.6 (calc) (ref 1.0–2.5)
ALT: 75 U/L — ABNORMAL HIGH (ref 9–46)
AST: 35 U/L (ref 10–35)
Albumin: 4.7 g/dL (ref 3.6–5.1)
Alkaline phosphatase (APISO): 70 U/L (ref 35–144)
Bilirubin, Direct: 0.1 mg/dL (ref 0.0–0.2)
Globulin: 3 g/dL (calc) (ref 1.9–3.7)
Indirect Bilirubin: 0.4 mg/dL (calc) (ref 0.2–1.2)
Total Bilirubin: 0.5 mg/dL (ref 0.2–1.2)
Total Protein: 7.7 g/dL (ref 6.1–8.1)

## 2020-04-15 LAB — TESTOSTERONE: Testosterone: 265 ng/dL (ref 250–827)

## 2020-04-18 ENCOUNTER — Telehealth: Payer: Self-pay | Admitting: Family Medicine

## 2020-04-18 NOTE — Telephone Encounter (Signed)
See results note. 

## 2020-04-18 NOTE — Telephone Encounter (Signed)
Pt called back to get his lab results

## 2020-04-21 ENCOUNTER — Other Ambulatory Visit: Payer: Self-pay | Admitting: Family Medicine

## 2020-04-21 DIAGNOSIS — F339 Major depressive disorder, recurrent, unspecified: Secondary | ICD-10-CM

## 2020-05-14 DIAGNOSIS — B349 Viral infection, unspecified: Secondary | ICD-10-CM | POA: Diagnosis not present

## 2020-05-14 DIAGNOSIS — Z20822 Contact with and (suspected) exposure to covid-19: Secondary | ICD-10-CM | POA: Diagnosis not present

## 2020-05-20 ENCOUNTER — Emergency Department (HOSPITAL_BASED_OUTPATIENT_CLINIC_OR_DEPARTMENT_OTHER)
Admission: EM | Admit: 2020-05-20 | Discharge: 2020-05-20 | Disposition: A | Discharge status: Home / Self Care | Payer: BC Managed Care – PPO | Attending: Emergency Medicine | Admitting: Emergency Medicine

## 2020-05-20 ENCOUNTER — Emergency Department (HOSPITAL_BASED_OUTPATIENT_CLINIC_OR_DEPARTMENT_OTHER): Payer: BC Managed Care – PPO

## 2020-05-20 ENCOUNTER — Other Ambulatory Visit: Payer: Self-pay

## 2020-05-20 ENCOUNTER — Telehealth (INDEPENDENT_AMBULATORY_CARE_PROVIDER_SITE_OTHER): Payer: BC Managed Care – PPO | Admitting: Family Medicine

## 2020-05-20 ENCOUNTER — Encounter (HOSPITAL_BASED_OUTPATIENT_CLINIC_OR_DEPARTMENT_OTHER): Payer: Self-pay | Admitting: *Deleted

## 2020-05-20 DIAGNOSIS — I10 Essential (primary) hypertension: Secondary | ICD-10-CM | POA: Insufficient documentation

## 2020-05-20 DIAGNOSIS — R0600 Dyspnea, unspecified: Secondary | ICD-10-CM

## 2020-05-20 DIAGNOSIS — Z79899 Other long term (current) drug therapy: Secondary | ICD-10-CM | POA: Diagnosis not present

## 2020-05-20 DIAGNOSIS — Z87718 Personal history of other specified (corrected) congenital malformations of genitourinary system: Secondary | ICD-10-CM | POA: Diagnosis not present

## 2020-05-20 DIAGNOSIS — R059 Cough, unspecified: Secondary | ICD-10-CM | POA: Diagnosis not present

## 2020-05-20 DIAGNOSIS — Z20822 Contact with and (suspected) exposure to covid-19: Secondary | ICD-10-CM | POA: Insufficient documentation

## 2020-05-20 DIAGNOSIS — R0609 Other forms of dyspnea: Secondary | ICD-10-CM

## 2020-05-20 DIAGNOSIS — R0602 Shortness of breath: Secondary | ICD-10-CM | POA: Insufficient documentation

## 2020-05-20 LAB — CBC WITH DIFFERENTIAL/PLATELET
Abs Immature Granulocytes: 0.16 10*3/uL — ABNORMAL HIGH (ref 0.00–0.07)
Basophils Absolute: 0.1 10*3/uL (ref 0.0–0.1)
Basophils Relative: 1 %
Eosinophils Absolute: 0.1 10*3/uL (ref 0.0–0.5)
Eosinophils Relative: 1 %
HCT: 50.3 % (ref 39.0–52.0)
Hemoglobin: 17.7 g/dL — ABNORMAL HIGH (ref 13.0–17.0)
Immature Granulocytes: 1 %
Lymphocytes Relative: 26 %
Lymphs Abs: 3 10*3/uL (ref 0.7–4.0)
MCH: 31.8 pg (ref 26.0–34.0)
MCHC: 35.2 g/dL (ref 30.0–36.0)
MCV: 90.5 fL (ref 80.0–100.0)
Monocytes Absolute: 1 10*3/uL (ref 0.1–1.0)
Monocytes Relative: 9 %
Neutro Abs: 7.1 10*3/uL (ref 1.7–7.7)
Neutrophils Relative %: 62 %
Platelets: 323 10*3/uL (ref 150–400)
RBC: 5.56 MIL/uL (ref 4.22–5.81)
RDW: 12.8 % (ref 11.5–15.5)
WBC: 11.5 10*3/uL — ABNORMAL HIGH (ref 4.0–10.5)
nRBC: 0 % (ref 0.0–0.2)

## 2020-05-20 LAB — COMPREHENSIVE METABOLIC PANEL
ALT: 73 U/L — ABNORMAL HIGH (ref 0–44)
AST: 36 U/L (ref 15–41)
Albumin: 4.7 g/dL (ref 3.5–5.0)
Alkaline Phosphatase: 66 U/L (ref 38–126)
Anion gap: 11 (ref 5–15)
BUN: 21 mg/dL — ABNORMAL HIGH (ref 6–20)
CO2: 26 mmol/L (ref 22–32)
Calcium: 9.9 mg/dL (ref 8.9–10.3)
Chloride: 99 mmol/L (ref 98–111)
Creatinine, Ser: 1.04 mg/dL (ref 0.61–1.24)
GFR, Estimated: >60 mL/min (ref >60)
Glucose, Bld: 154 mg/dL — ABNORMAL HIGH (ref 70–99)
Potassium: 4 mmol/L (ref 3.5–5.1)
Sodium: 136 mmol/L (ref 135–145)
Total Bilirubin: 0.6 mg/dL (ref 0.3–1.2)
Total Protein: 8.4 g/dL — ABNORMAL HIGH (ref 6.5–8.1)

## 2020-05-20 LAB — RESP PANEL BY RT-PCR (FLU A&B, COVID) ARPGX2
Influenza A by PCR: NEGATIVE
Influenza B by PCR: NEGATIVE
SARS Coronavirus 2 by RT PCR: NEGATIVE

## 2020-05-20 LAB — TROPONIN I (HIGH SENSITIVITY)
Troponin I (High Sensitivity): 5 ng/L (ref <18)
Troponin I (High Sensitivity): 5 ng/L (ref <18)

## 2020-05-20 LAB — D-DIMER, QUANTITATIVE: D-Dimer, Quant: 0.44 ug/mL-FEU (ref 0.00–0.50)

## 2020-05-20 LAB — BRAIN NATRIURETIC PEPTIDE: B Natriuretic Peptide: 46.6 pg/mL (ref 0.0–100.0)

## 2020-05-20 NOTE — Patient Instructions (Signed)
Seek inperson care today for your symptoms.

## 2020-05-20 NOTE — ED Provider Notes (Signed)
[name] Provider Note   CSN: [id number] Arrival date & time: 05/20/20  1323     History No chief complaint on file.   Adam Mcdonald is a 54 y.o. male.  54 y.o male with a PMH of Depression, HTN presents to the ED with a chief complaint of dyspnea on exertion.  Patient suffered from a cold earlier this month, came in today as he had a telehealth visit with his PCP who recommended he be further evaluated due to his dyspnea on exertion.  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  Did experience 2 weeks of chills however these have now resolved.  She does have a previous history of an abnormal EKG, no EKG was obtained prior to visit to the ED.  Denies any fever, cough, chest pain.  No prior history of blood clots.   The history is provided by the patient.  Shortness of Breath Severity:  Moderate Onset quality:  Gradual Duration:  3 days Timing:  Intermittent Progression:  Waxing and waning Chronicity:  New Context: activity and URI   Context: not emotional upset, not fumes and not smoke exposure   Relieved by:  Lying down and rest Worsened by:  Movement and exertion Ineffective treatments:  None tried Associated symptoms: no abdominal pain, no chest pain, no fever, no headaches, no sore throat and no vomiting   Risk factors: obesity   Risk factors: no family hx of DVT, no hx of cancer, no hx of PE/DVT, no prolonged immobilization, no recent surgery and no tobacco use        Past Medical History:  Diagnosis Date  . Chicken pox   . Depression   . History of MRSA infection   . Sleep apnea   . Ventral hernia without obstruction or gangrene   . Vitamin D deficiency     Patient Active Problem List   Diagnosis Date Noted  . Hypertension 08/31/2019  . Low back pain 06/23/2018  . Depression, recurrent ([place]) 06/02/2018  .  Vitamin D deficiency 06/02/2018    Past Surgical History:  Procedure Laterality Date  . UVULOPALATOPHARYNGOPLASTY         Family History  Problem Relation Age of Onset  . Diabetes Mother   . Depression Mother   . High blood pressure Father   . Stroke Maternal Grandfather   . Heart attack Paternal Grandmother 45  . Heart attack Paternal Grandfather 59    Social History   Tobacco Use  . Smoking status: Never Smoker  . Smokeless tobacco: Never Used  Substance Use Topics  . Alcohol use: Yes    Comment: occasional  . Drug use: Never    Home Medications Prior to Admission medications   Medication Sig Start Date End Date Taking? Authorizing Provider  acetaminophen (TYLENOL) 500 MG tablet Take 500 mg by mouth 2 (two) times daily.   Yes [provider]  buPROPion (WELLBUTRIN XL) 300 MG 24 hr tablet TAKE ONE TABLET BY MOUTH DAILY 01/17/20  Yes Koberlein, Junell C, MD  DULoxetine (CYMBALTA) 60 MG capsule TAKE ONE CAPSULE BY MOUTH DAILY 04/22/20  Yes Koberlein, Junell C, MD  losartan-hydrochlorothiazide (HYZAAR) 100-25 MG tablet TAKE ONE TABLET BY MOUTH DAILY 04/21/20  Yes Koberlein, Junell C, MD  meloxicam (MOBIC) 15 MG tablet TAKE ONE TABLET BY MOUTH DAILY 01/17/20  Yes Caren Macadam, MD  VITAMIN  D PO Take 5,000 Units by mouth daily.   Yes [provider]  CREATINE PO Take 5 g by mouth.    [provider]  Docusate Sodium 100 MG capsule Take 300 mg by mouth at bedtime.    [provider]  MAGNESIUM GLYCINATE PLUS PO Take 120 mg by mouth 2 (two) times daily.    [provider]  MELATONIN PO Take 5 mg by mouth at bedtime.    [provider]  Multiple Vitamin (MULTI-VITAMIN DAILY PO) Take by mouth.    [provider]  Omega 3 1200 MG CAPS Take by mouth.    [provider]    Allergies    Other and Trazodone and nefazodone  Review of Systems   Review of Systems  Constitutional: Negative for chills and  fever.  HENT: Negative for sore throat.   Eyes: Negative for pain.  Respiratory: Positive for shortness of breath.   Cardiovascular: Negative for chest pain, palpitations and leg swelling.  Gastrointestinal: Negative for abdominal pain, diarrhea, nausea and vomiting.  Genitourinary: Negative for flank pain.  Musculoskeletal: Negative for back pain.  Neurological: Negative for weakness, light-headedness and headaches.  All other systems reviewed and are negative.   Physical Exam Updated Vital Signs BP 114/76 (BP Location: Right Arm)   Pulse 88   Temp 98 F (36.7 C) (Oral)   Resp 20   Ht 5\' 7"  (1.702 m)   Wt 116.9 kg   SpO2 100%   BMI 40.36 kg/m   Physical Exam Vitals and nursing note reviewed.  Constitutional:      Appearance: Normal appearance.  HENT:     Head: Normocephalic and atraumatic.     Nose: Nose normal.     Mouth/Throat:     Mouth: Mucous membranes are moist.     Comments: Oropharynx is clear without any erythema, no tonsillar abscess, no tonsillar exudates. Cardiovascular:     Comments: No bilateral pitting edema, no calf tenderness. Pulmonary:     Effort: Pulmonary effort is normal.     Breath sounds: No wheezing, rhonchi or rales.     Comments: Lungs are clear to auscultation with no wheezing, rhonchi, rales. Abdominal:     General: Abdomen is flat.     Tenderness: There is no abdominal tenderness. There is no right CVA tenderness or left CVA tenderness.  Musculoskeletal:     Cervical back: Normal range of motion and neck supple.  Skin:    General: Skin is warm and dry.  Neurological:     Mental Status: He is alert and oriented to person, place, and time.     ED Results / Procedures / Treatments   Labs (all labs ordered are listed, but only abnormal results are displayed) Labs Reviewed  CBC WITH DIFFERENTIAL/PLATELET - Abnormal; Notable for the following components:      Result Value   WBC 11.5 (*)    Hemoglobin 17.7 (*)    Abs Immature  Granulocytes 0.16 (*)    All other components within normal limits  COMPREHENSIVE METABOLIC PANEL - Abnormal; Notable for the following components:   Glucose, Bld 154 (*)    BUN 21 (*)    Total Protein 8.4 (*)    ALT 73 (*)    All other components within normal limits  RESP PANEL BY RT-PCR (FLU A&B, COVID) ARPGX2  BRAIN NATRIURETIC PEPTIDE  D-DIMER, QUANTITATIVE (NOT AT [facility])  TROPONIN I (HIGH SENSITIVITY)  TROPONIN I (HIGH SENSITIVITY)    EKG  EKG Interpretation  Date/Time:  Tuesday May 20 2020 17:14:26 EST Ventricular Rate:  97 PR Interval:  148 QRS Duration: 94 QT Interval:  316 QTC Calculation: 401 R Axis:   -80 Text Interpretation: Normal sinus rhythm Left anterior fascicular block Inferior-posterior infarct , age undetermined Abnormal ECG No old tracing to compare Confirmed by Deno Etienne [phone]) on 05/20/2020 6:42:26 PM   Radiology DG Chest 1 View  Result Date: 05/20/2020 CLINICAL DATA:  Cough, chills, and shortness of breath. EXAM: CHEST  1 VIEW COMPARISON:  None. FINDINGS: The heart size and mediastinal contours are within normal limits. Both lungs are clear. The visualized skeletal structures are unremarkable. IMPRESSION: No active disease. Electronically Signed   By: Titus Dubin M.D.   On: 05/20/2020 17:31    Procedures Procedures (including critical care time)  Medications Ordered in ED Medications - No data to display  ED Course  I have reviewed the triage vital signs and the nursing notes.  Pertinent labs & imaging results that were available during my care of the patient were reviewed by me and considered in my medical decision making (see chart for details).  Clinical Course as of 05/20/20 1940  Tue May 20, 2020  1656 SARS Coronavirus 2 by RT PCR: NEGATIVE [JS]  4128 ALT(!): 73 Stopped alcohol consumption x 6 months. Patient is aware of lab elevation since last physical.  [JS]  1843 WBC(!): 11.5 [JS]    Clinical Course User Index [JS] Janeece Fitting, PA-C   MDM Rules/Calculators/A&P    Patient with a past medical history of hypertension presents to the ED after telehealth appointment with PCP for dyspnea on exertion.  Patient suffering from a URI earlier this month, reports his symptoms resolved.  However today when returning to work, began having dyspnea on exertion.  She reports rest improves his symptoms.  He has not taken any medication for improvement.  He has a previous history of abnormal EKG.  No cardiac history, no family history of cardiac disease or blood clots.  During evaluation patient is overall well-appearing, oropharynx is clear without any erythema, no tonsillar abscess or tonsillar exudates.  Vitals are within normal limits, heart rate is slightly elevated in the low 100s.  Does not report any fever or chest pain on today's visit.  Lateral legs without any pitting edema, no calf tenderness, no prior history of DVTs or history of heart failure.  Patient does have multiple comorbidities,will obtain cardiac workup.Interpretation of his labs by me with a slight patient his white blood cell count, he is hemoconcentrated, although he denies any history of smoking.  CMP without any electrolyte derangement.  LFT is slightly elevated, he is aware of this as his previous physical showed the same level.  BNP is within normal limits, no prior history of CHF, no bilateral leg swelling or calf tenderness.  First troponin is negative, will obtain delta.  We discussed risk and benefits   Xray of the chest: No acute abnormalities.   No prior history of tobacco use, no prior history of blood clots, no bilateral leg swelling, no prior history of cancer. Feel patient is appropriate for dimer, lower suspicion for pulmonary embolism at this time.  We discussed D-dimer level.  Second troponin is unchanged, have a lower suspicion for ACS at this time.  D-dimer is negative on today's visit.  We discussed continuing to hydrate at home,  follow-up with PCP.  Patient understands agrees to management, return precautions provided at length.   Portions  of this note were generated with Lobby[employer]. Dictation errors may occur despite best attempts at proofreading.  Final Clinical Impression(s) / ED Diagnoses Final diagnoses:  Dyspnea on exertion    Rx / DC Orders ED Discharge Orders    None       Janeece Fitting, PA-C 05/20/20 [place], Doctor [place], DO 05/20/20 2128

## 2020-05-20 NOTE — Discharge Instructions (Addendum)
Your laboratory toes are within normal limits today.  Your chest x-ray did not show any acute findings.  You may continue to hydrate and rest, please follow-up with your primary care physician as needed.  Your Covid test on today's visit was negative.

## 2020-05-20 NOTE — ED Triage Notes (Signed)
Sob. Chills and cough. He had a negative Covid test 4 days ago.

## 2020-05-20 NOTE — Progress Notes (Signed)
Virtual Visit via Telephone Note  I connected with Adam Mcdonald on 05/20/20 at 12:40 PM EST by telephone and verified that I am speaking with the correct person using two identifiers.   I discussed the limitations, risks, security and privacy concerns of performing an evaluation and management service by telephone and the availability of in person appointments. I also discussed with the patient that there may be a patient responsible charge related to this service. The patient expressed understanding and agreed to proceed.  Location patient: home, [place] Location provider: work or home office Participants present for the call: patient, provider Patient did not have a visit with me in the prior 7 days to address this/these issue(s).   History of Present Illness:  Acute telemedicine visit for DOE: -the last several days at work has had intermittent DOE that is relieved with rest -had "winter cold" Dec 1st, with nasal congestion and mild cough,  but those symptoms had resolved for the most part before this started -he had a negative covid19 PCR test for those symptoms -Denies:CP, palpitations, dizziness - currently has been sitting for the last 1 hour and symptoms have resolved: -Pertinent past medical history: obesity, HTN and hx of tachycardia with ? Abnormal EKG in the past -COVID-19 vaccine status: fully vaccinated   Observations/Objective: Patient sounds cheerful and well on the phone. I do not appreciate any SOB. Speech and thought processing are grossly intact. Patient reported vitals:  Assessment and Plan:  DOE (dyspnea on exertion)  -we discussed possible serious and likely etiologies, options for evaluation and workup, limitations of telemedicine visit vs in person visit, treatment, treatment risks and precautions. Pt prefers to treat via telemedicine empirically rather than in person at this moment. Given concerning symptoms of DOE, relieved with rest, and PMH advised needed  higher level of care for prompt evaluation. Possible bronchitis from recent illness, but cardiac and other more concerning etiologies need to ruled out. 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  Follow Up Instructions:  I did not refer this patient for an OV with me in the next 24 hours for this/these issue(s).  I discussed the assessment and treatment plan with the patient. The patient was provided an opportunity to ask questions and all were answered. The patient agreed with the plan and demonstrated an understanding of the instructions.   I spent 20 minutes on this encounter.   Lucretia Kern, DO

## 2020-07-15 ENCOUNTER — Encounter: Payer: Self-pay | Admitting: Family Medicine

## 2020-07-15 DIAGNOSIS — D72829 Elevated white blood cell count, unspecified: Secondary | ICD-10-CM

## 2020-07-15 DIAGNOSIS — B349 Viral infection, unspecified: Secondary | ICD-10-CM | POA: Diagnosis not present

## 2020-07-15 DIAGNOSIS — R0602 Shortness of breath: Secondary | ICD-10-CM

## 2020-07-15 DIAGNOSIS — Z20822 Contact with and (suspected) exposure to covid-19: Secondary | ICD-10-CM | POA: Diagnosis not present

## 2020-07-15 DIAGNOSIS — R7309 Other abnormal glucose: Secondary | ICD-10-CM

## 2020-07-15 NOTE — Telephone Encounter (Signed)
Spoke with the pt and advised due to shortness of breath with activity, he should go to the ER immediately.  Patient stated he went to the ER 1 month ago and was told nothing was wrong and asked what would be done there and if we could refer him to a respiratory specialist.  I advised the pt he should go back to the ER for treatment as they can order the tests that are needed  and refer to someone if needed.  Message sent to PCP.

## 2020-07-16 ENCOUNTER — Other Ambulatory Visit: Payer: Self-pay | Admitting: Family Medicine

## 2020-07-16 DIAGNOSIS — M25572 Pain in left ankle and joints of left foot: Secondary | ICD-10-CM

## 2020-07-16 DIAGNOSIS — G8929 Other chronic pain: Secondary | ICD-10-CM

## 2020-07-16 DIAGNOSIS — F321 Major depressive disorder, single episode, moderate: Secondary | ICD-10-CM

## 2020-07-16 NOTE — Telephone Encounter (Signed)
Spoke with patient by phone, reviewed his note. He has had ongoing sx since December; worsened after illness. He had covid test done yesterday (rapid negative; awaiting pcr) at [facility]. They wouldn't see him but talked to him and prescribed advair, azithromycin, prednisone. He has no cough, no chest pressure or pain. He is able to work through shortness of breath at work; but feels more tired. No edema, no PND. No heart racing.   He has had at least 2 significant resp illnesses in 2 months; may be reactive; may be secondary pneumonia. He is feeling hot; no fevers. Advised if sx worsen go to ER but in meanwhile:   Once he gets pcr test results: if negative - then call for lab/xray appointment. I do feel we can work this up as outpatient.   If positive; call and ask to be referred to covid/resp clinic for evaluation.   He is ok with this plan.  Did advise to take medications prescribed from [facility] as he is tolerating them and they could be very helpful with breathing. ER labs reviewed. Xray reviewed. ekg reviewed/no prior for comparison.

## 2020-07-21 ENCOUNTER — Ambulatory Visit (INDEPENDENT_AMBULATORY_CARE_PROVIDER_SITE_OTHER): Payer: BC Managed Care – PPO

## 2020-07-21 ENCOUNTER — Other Ambulatory Visit: Payer: Self-pay

## 2020-07-21 ENCOUNTER — Other Ambulatory Visit (INDEPENDENT_AMBULATORY_CARE_PROVIDER_SITE_OTHER): Payer: BC Managed Care – PPO

## 2020-07-21 ENCOUNTER — Other Ambulatory Visit: Payer: BC Managed Care – PPO

## 2020-07-21 DIAGNOSIS — R0602 Shortness of breath: Secondary | ICD-10-CM | POA: Diagnosis not present

## 2020-07-21 DIAGNOSIS — R7309 Other abnormal glucose: Secondary | ICD-10-CM | POA: Diagnosis not present

## 2020-07-21 DIAGNOSIS — R059 Cough, unspecified: Secondary | ICD-10-CM | POA: Diagnosis not present

## 2020-07-21 LAB — CBC WITH DIFFERENTIAL/PLATELET
Basophils Absolute: 0.1 10*3/uL (ref 0.0–0.1)
Basophils Relative: 0.6 % (ref 0.0–3.0)
Eosinophils Absolute: 0.1 10*3/uL (ref 0.0–0.7)
Eosinophils Relative: 1.2 % (ref 0.0–5.0)
HCT: 51.3 % (ref 39.0–52.0)
Hemoglobin: 17.6 g/dL — ABNORMAL HIGH (ref 13.0–17.0)
Lymphocytes Relative: 30.3 % (ref 12.0–46.0)
Lymphs Abs: 3.3 10*3/uL (ref 0.7–4.0)
MCHC: 34.4 g/dL (ref 30.0–36.0)
MCV: 90.9 fl (ref 78.0–100.0)
Monocytes Absolute: 0.9 10*3/uL (ref 0.1–1.0)
Monocytes Relative: 8.1 % (ref 3.0–12.0)
Neutro Abs: 6.6 10*3/uL (ref 1.4–7.7)
Neutrophils Relative %: 59.8 % (ref 43.0–77.0)
Platelets: 317 10*3/uL (ref 150.0–400.0)
RBC: 5.64 Mil/uL (ref 4.22–5.81)
RDW: 13 % (ref 11.5–15.5)
WBC: 11 10*3/uL — ABNORMAL HIGH (ref 4.0–10.5)

## 2020-07-21 LAB — TROPONIN I (HIGH SENSITIVITY): High Sens Troponin I: 8 ng/L (ref 2–17)

## 2020-07-21 LAB — COMPREHENSIVE METABOLIC PANEL
ALT: 76 U/L — ABNORMAL HIGH (ref 0–53)
AST: 39 U/L — ABNORMAL HIGH (ref 0–37)
Albumin: 4.6 g/dL (ref 3.5–5.2)
Alkaline Phosphatase: 69 U/L (ref 39–117)
BUN: 19 mg/dL (ref 6–23)
CO2: 29 mEq/L (ref 19–32)
Calcium: 9.8 mg/dL (ref 8.4–10.5)
Chloride: 96 mEq/L (ref 96–112)
Creatinine, Ser: 1.14 mg/dL (ref 0.40–1.50)
GFR: 73.86 mL/min (ref >60.00)
Glucose, Bld: 123 mg/dL — ABNORMAL HIGH (ref 70–99)
Potassium: 4.1 mEq/L (ref 3.5–5.1)
Sodium: 136 mEq/L (ref 135–145)
Total Bilirubin: 0.9 mg/dL (ref 0.2–1.2)
Total Protein: 7.7 g/dL (ref 6.0–8.3)

## 2020-07-21 LAB — FERRITIN: Ferritin: 372.4 ng/mL — ABNORMAL HIGH (ref 22.0–322.0)

## 2020-07-21 LAB — HEMOGLOBIN A1C: Hgb A1c MFr Bld: 7 % — ABNORMAL HIGH (ref 4.6–6.5)

## 2020-07-21 LAB — BRAIN NATRIURETIC PEPTIDE: Pro B Natriuretic peptide (BNP): 29 pg/mL (ref 0.0–100.0)

## 2020-07-21 NOTE — Addendum Note (Signed)
Addended by: Tessie Fass D on: 07/21/2020 09:16 AM   Modules accepted: Orders

## 2020-07-22 LAB — D-DIMER, QUANTITATIVE: D-Dimer, Quant: 0.4 mcg/mL FEU (ref <0.50)

## 2020-07-23 ENCOUNTER — Other Ambulatory Visit: Payer: Self-pay

## 2020-07-23 ENCOUNTER — Encounter: Payer: Self-pay | Admitting: Family Medicine

## 2020-07-23 ENCOUNTER — Ambulatory Visit: Payer: BC Managed Care – PPO | Admitting: Family Medicine

## 2020-07-23 VITALS — BP 118/80 | HR 71 | Temp 98.4°F | Ht 67.0 in | Wt 259.4 lb

## 2020-07-23 DIAGNOSIS — E1165 Type 2 diabetes mellitus with hyperglycemia: Secondary | ICD-10-CM

## 2020-07-23 DIAGNOSIS — J4540 Moderate persistent asthma, uncomplicated: Secondary | ICD-10-CM

## 2020-07-23 DIAGNOSIS — R0602 Shortness of breath: Secondary | ICD-10-CM | POA: Diagnosis not present

## 2020-07-23 MED ORDER — SPACER/AERO-HOLDING CHAMBERS DEVI: 1.0000 | Freq: Every day | 0 refills | Status: DC | PRN

## 2020-07-23 MED ORDER — ALBUTEROL SULFATE HFA 108 (90 BASE) MCG/ACT IN AERS: 2.0000 | INHALATION_SPRAY | Freq: Four times a day (QID) | RESPIRATORY_TRACT | 0 refills | Status: DC | PRN

## 2020-07-23 NOTE — Patient Instructions (Signed)
Suggest 2 puffs twice daily with the advair for at least the next week; then can go back to 1 puff twice daily until inhaler runs out.   Use spacing chamber with the advair - you will breathe out into chamber; then push inhaler and take 4 big breaths (in and out is one breath) for each puff of inhaler. Spacing chamber will make a light whistling sound if you breathe too fast.

## 2020-07-23 NOTE — Progress Notes (Signed)
Adam Mcdonald DOB: 06-07-1966 Encounter date: 07/23/2020  This is a 55 y.o. male who presents with No chief complaint on file.   History of present illness: See prior notes - has had increasing shortness of breath. Recent COVID testing negative, normal cxr, labwork negative d dimer and cardiac labs.  Still taking advair, but one puff twice daily. States sometimes feels that medication is hitting back in throat. Finished prednisone last week - 6 day.   Was out of work last week. Just back this week - was able to carry load up stairs, didn't feel as short of breath with this, but feels a little sooner w exertion. He was at work for 4 hours this morning and pleased to see normal pulse because feels a little elevated.   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  Had really significant stress at work in Dec, eating more, put on weight. Bad episode. Went from very significant down x 3 weeks and then snapped out in 4th week. Not mania, just felt better.   No nasal congestion, but in morning does cough in morning - productive of phlegm; then dry most of day; some phlegm occasionally through day. Was coughing through night; this is better.   Allergies  Allergen Reactions   Other     Thimerosal (eyes only)   Trazodone And Nefazodone     Dry mouth   No outpatient medications have been marked as taking for the 07/23/20 encounter (Appointment) with Caren Macadam, MD.    Review of Systems  Constitutional: Negative for chills, fatigue and fever.  Respiratory: Positive for cough and shortness of breath. Negative for chest tightness and wheezing.   Cardiovascular: Negative for chest pain, palpitations and leg swelling.    Objective:  There were no vitals taken for this visit.      BP Readings from Last 3 Encounters:  05/20/20 124/74  04/14/20 130/78  01/22/20 (!) 134/98   Wt Readings from Last 3 Encounters:  05/20/20 257 lb 11.2 oz (116.9 kg)  04/14/20 244  lb 12.8 oz (111 kg)  01/22/20 250 lb (113.4 kg)    Physical Exam Constitutional:      General: He is not in acute distress.    Appearance: He is well-developed.  Cardiovascular:     Rate and Rhythm: Normal rate and regular rhythm.     Heart sounds: Normal heart sounds. No murmur heard. No friction rub.  Pulmonary:     Effort: Pulmonary effort is normal. No respiratory distress.     Breath sounds: Decreased breath sounds present. No wheezing or rales.  Musculoskeletal:     Right lower leg: No edema.     Left lower leg: No edema.  Neurological:     Mental Status: He is alert and oriented to person, place, and time.  Psychiatric:        Behavior: Behavior normal.     Assessment/Plan  1. Shortness of breath This has improved somewhat with starting inhaler and with steroid that he was given.  He did maintain an oxygen saturation above 95% with walking around in the office today.  I do feel he will continue to improve with changes made today (see below)  2. Moderate persistent reactive airway disease without complication We discussed using a spacer with his inhaler.  I feel that this will help him be more efficient with medication use.  I have encouraged him to do 2 puffs twice daily with his Advair.  He is  aware to rinse mouth after use.  We also discussed using albuterol twice a day to help keep airways open.  We discussed that chest x-ray was normal as well as blood work evaluation.  3.  Newly diagnosed diabetes.  We discussed low carbohydrate diet and limiting carb choices to 45 g or less with meals.  We are going to plan to have him come back in 3 months and recheck an A1c in the office.  He was eating fairly poorly over the last few months, so I suspect with this information, and his motivation to improve he will be able to improve on blood sugar without medications.   Return in about 3 months (around 10/20/2020) for Chronic condition visit.    Micheline Rough, MD

## 2020-08-06 ENCOUNTER — Encounter: Payer: Self-pay | Admitting: Family Medicine

## 2020-08-10 ENCOUNTER — Encounter (HOSPITAL_BASED_OUTPATIENT_CLINIC_OR_DEPARTMENT_OTHER): Payer: Self-pay | Admitting: Emergency Medicine

## 2020-08-10 ENCOUNTER — Emergency Department (HOSPITAL_BASED_OUTPATIENT_CLINIC_OR_DEPARTMENT_OTHER): Payer: BC Managed Care – PPO

## 2020-08-10 ENCOUNTER — Emergency Department (HOSPITAL_BASED_OUTPATIENT_CLINIC_OR_DEPARTMENT_OTHER)
Admission: EM | Admit: 2020-08-10 | Discharge: 2020-08-10 | Disposition: A | Discharge status: Home / Self Care | Payer: BC Managed Care – PPO | Attending: Emergency Medicine | Admitting: Emergency Medicine

## 2020-08-10 ENCOUNTER — Other Ambulatory Visit: Payer: Self-pay

## 2020-08-10 DIAGNOSIS — R5383 Other fatigue: Secondary | ICD-10-CM

## 2020-08-10 DIAGNOSIS — R0600 Dyspnea, unspecified: Secondary | ICD-10-CM

## 2020-08-10 DIAGNOSIS — R0602 Shortness of breath: Secondary | ICD-10-CM | POA: Diagnosis not present

## 2020-08-10 DIAGNOSIS — Z79899 Other long term (current) drug therapy: Secondary | ICD-10-CM | POA: Diagnosis not present

## 2020-08-10 DIAGNOSIS — I10 Essential (primary) hypertension: Secondary | ICD-10-CM | POA: Diagnosis not present

## 2020-08-10 DIAGNOSIS — R059 Cough, unspecified: Secondary | ICD-10-CM | POA: Diagnosis not present

## 2020-08-10 HISTORY — DX: Other chronic pain: G89.29

## 2020-08-10 HISTORY — DX: Essential (primary) hypertension: I10

## 2020-08-10 LAB — TSH: TSH: 1.167 u[IU]/mL (ref 0.350–4.500)

## 2020-08-10 LAB — CBG MONITORING, ED: Glucose-Capillary: 136 mg/dL — ABNORMAL HIGH (ref 70–99)

## 2020-08-10 MED ORDER — IOHEXOL 350 MG/ML SOLN
100.0000 mL | Freq: Once | INTRAVENOUS | Status: AC | PRN
  Administered 2020-08-10: 72 mL via INTRAVENOUS

## 2020-08-10 NOTE — ED Notes (Signed)
Pt discharged to home. Discharge instructions have been discussed with patient and/or family members. Pt verbally acknowledges understanding d/c instructions, and endorses comprehension to checkout at registration before leaving.  °

## 2020-08-10 NOTE — ED Provider Notes (Signed)
Adam Mcdonald   CSN: [id number] Arrival date & time: 08/10/20  0548     History Chief Complaint  Patient presents with  . Shortness of Breath    Adam Mcdonald is a 55 y.o. male.  HPI Patient presents with shortness of breath.  Worsening.  Began in January.  Had URI symptoms at time and had negative Covid test then.  Since that is also a negative Covid test.  Has had chills.  States he has more shortness of breath than he did previously.  Increased fatigue.  Coughs at times without real sputum production a more.  No weight loss.  Has been on Advair and albuterol and states it has been worse over the last week.  He is on Mobic for chronic pain.  No swelling in his legs.  Had no lung problems prior to January.  Patient does not smoke.    Past Medical History:  Diagnosis Date  . Chicken pox   . Chronic pain   . Depression   . History of MRSA infection   . Hypertension   . Sleep apnea   . Sleep apnea   . Ventral hernia without obstruction or gangrene   . Vitamin D deficiency     Patient Active Problem List   Diagnosis Date Noted  . Hypertension 08/31/2019  . Low back pain 06/23/2018  . Depression, recurrent ([place]) 06/02/2018  . Vitamin D deficiency 06/02/2018    Past Surgical History:  Procedure Laterality Date  . ACHILLES TENDON REPAIR    . UVULOPALATOPHARYNGOPLASTY         Family History  Problem Relation Age of Onset  . Diabetes Mother   . Depression Mother   . High blood pressure Father   . Stroke Maternal Grandfather   . Heart attack Paternal Grandmother 19  . Heart attack Paternal Grandfather 62    Social History   Tobacco Use  . Smoking status: Never Smoker  . Smokeless tobacco: Never Used  Substance Use Topics  . Alcohol use: Not Currently    Comment: nothing since June 2021  . Drug use: Never    Home Medications Prior to Admission medications   Medication Sig Start Date End Date Taking? Authorizing  Provider  albuterol (VENTOLIN HFA) 108 (90 Base) MCG/ACT inhaler Inhale 2 puffs into the lungs every 6 (six) hours as needed for wheezing or shortness of breath. 07/23/20   Koberlein, Steele Berg, MD  buPROPion (WELLBUTRIN XL) 300 MG 24 hr tablet TAKE ONE TABLET BY MOUTH DAILY 07/17/20   Koberlein, Andris Flurry C, MD  DULoxetine (CYMBALTA) 60 MG capsule TAKE ONE CAPSULE BY MOUTH DAILY 04/22/20   Caren Macadam, MD  fluticasone-salmeterol (ADVAIR HFA) 115-21 MCG/ACT inhaler Inhale 2 puffs into the lungs 2 (two) times daily.    [provider]  losartan-hydrochlorothiazide (HYZAAR) 100-25 MG tablet TAKE ONE TABLET BY MOUTH DAILY 04/21/20   Koberlein, Steele Berg, MD  meloxicam (MOBIC) 15 MG tablet TAKE ONE TABLET BY MOUTH DAILY 07/17/20   Caren Macadam, MD  Multiple Vitamin (MULTI-VITAMIN DAILY PO) Take by mouth.    [provider]  Omega 3 1200 MG CAPS Take by mouth.    [provider]  Spacer/Aero-Holding Chambers DEVI 1 Device by Does not apply route daily as needed. 07/23/20   Caren Macadam, MD    Allergies    Other and Trazodone and nefazodone  Review of Systems   Review of Systems  Constitutional: Positive for appetite  change, chills and fatigue. Negative for fever and unexpected weight change.  HENT: Negative for congestion.   Respiratory: Positive for shortness of breath.   Cardiovascular: Negative for chest pain.  Gastrointestinal: Negative for abdominal pain.  Endocrine: Negative for polyuria.  Genitourinary: Negative for flank pain.  Musculoskeletal: Negative for back pain.  Skin: Negative for rash.  Neurological: Negative for weakness.  Psychiatric/Behavioral: Negative for behavioral problems.    Physical Exam Updated Vital Signs BP 140/82   Pulse 79   Temp 98.2 F (36.8 C) (Oral)   Resp 18   Ht 5\' 7"  (1.702 m)   Wt 113.4 kg   SpO2 96%   BMI 39.16 kg/m   Physical Exam Vitals and nursing Mcdonald reviewed.  HENT:     Head: Normocephalic  and atraumatic.  Eyes:     Pupils: Pupils are equal, round, and reactive to light.  Cardiovascular:     Rate and Rhythm: Normal rate and regular rhythm.  Pulmonary:     Breath sounds: Normal breath sounds. No wheezing, rhonchi or rales.  Chest:     Chest wall: No mass.  Abdominal:     Tenderness: There is no abdominal tenderness.  Musculoskeletal:     Right lower leg: No edema.     Left lower leg: No edema.  Skin:    General: Skin is warm.     Capillary Refill: Capillary refill takes less than 2 seconds.  Neurological:     Mental Status: He is alert and oriented to person, place, and time.     ED Results / Procedures / Treatments   Labs (all labs ordered are listed, but only abnormal results are displayed) Labs Reviewed  CBG MONITORING, ED - Abnormal; Notable for the following components:      Result Value   Glucose-Capillary 136 (*)    All other components within normal limits  TSH    EKG None  Radiology DG Chest 2 View  Result Date: 08/10/2020 CLINICAL DATA:  Dyspnea EXAM: CHEST - 2 VIEW COMPARISON:  07/21/2020 FINDINGS: Lungs are well expanded, symmetric, and clear. No pneumothorax or pleural effusion. Cardiac size within normal limits. Pulmonary vascularity is normal. Osseous structures are age-appropriate. No acute bone abnormality. IMPRESSION: No active cardiopulmonary disease. Electronically Signed   By: Fidela Salisbury MD   On: 08/10/2020 06:41   CT Angio Chest PE W and/or Wo Contrast  Result Date: 08/10/2020 CLINICAL DATA:  55 year old male with worsening shortness of breath, pulmonary embolism suspected. EXAM: CT ANGIOGRAPHY CHEST WITH CONTRAST TECHNIQUE: Multidetector CT imaging of the chest was performed using the standard protocol during bolus administration of intravenous contrast. Multiplanar CT image reconstructions and MIPs were obtained to evaluate the vascular anatomy. CONTRAST:  Seventy-two mL Omnipaque 350, intravenous COMPARISON:  None. FINDINGS:  Cardiovascular: Satisfactory opacification of the pulmonary arteries to the segmental level. No evidence of pulmonary embolism. Normal heart size. No pericardial effusion. The heart is normal in size. No pericardial effusion. Mild coronary atherosclerotic calcifications. Aortic valvular calcifications are noted. Mediastinum/Nodes: No enlarged mediastinal, hilar, or axillary lymph nodes. Thyroid gland, trachea, and esophagus demonstrate no significant findings. Lungs/Pleura: No focal consolidations. No suspicious pulmonary nodules. There are a few scattered punctate calcified granulomas in the right middle lobe. No pleural effusion or pneumothorax. Upper Abdomen: Diffusely decreased attenuation of the hepatic parenchyma. No focal masses or contour abnormalities. The remaining visualized upper abdomen is within normal limits. Musculoskeletal: No chest wall abnormality. No acute or significant osseous findings. Review of the MIP  images confirms the above findings. IMPRESSION: Vascular: No evidence of pulmonary embolism. Non-Vascular: 1. No intrathoracic abnormality to explain shortness of breath. 2. Hepatic steatosis. Ruthann Cancer, MD [facility]iology Electronically Signed   By: Ruthann Cancer MD   On: 08/10/2020 08:28    Procedures Procedures   Medications Ordered in ED Medications  iohexol (OMNIPAQUE) 350 MG/ML injection 100 mL (72 mLs Intravenous Contrast Given 08/10/20 0750)    ED Course  I have reviewed the triage vital signs and the nursing notes.  Pertinent labs & imaging results that were available during my care of the patient were reviewed by me and considered in my medical decision making (see chart for details).    MDM Rules/Calculators/A&P                          Patient presents with dyspnea and fatigue.  History of same.  Has had negative dimers but with the timing of the disease may not of been positive due to toming positive PE.   CT scan done and reassuring.  Patient will need follow-up with pulmonology.  Not hypoxic here.  Has had around 3 months of symptoms.  Doubt cardiac cause.  Doubt pneumothorax. Final Clinical Impression(s) / ED Diagnoses Final diagnoses:  Dyspnea, unspecified type  Fatigue, unspecified type    Rx / DC Orders ED Discharge Orders    None       Davonna Belling, MD 08/11/20 1900

## 2020-08-10 NOTE — Discharge Instructions (Addendum)
Follow-up with pulmonology, your primary care and potentially cardiology for the continued fatigue and shortness of breath.  Your TSH has been done but not resulted yet.

## 2020-08-10 NOTE — ED Triage Notes (Signed)
Continued SHOB on exertion since January. Pt has been seen for same and has f/u for same. Pt states most recently he was given Advair, and Albuterol and has not improved. He states his symptoms have only worsened. SHOB on exertion, "extreme fatigue", intermittent cough.

## 2020-08-10 NOTE — ED Notes (Signed)
CBG 136, not crossed over yet

## 2020-08-13 ENCOUNTER — Other Ambulatory Visit: Payer: Self-pay | Admitting: Family Medicine

## 2020-08-13 DIAGNOSIS — R0602 Shortness of breath: Secondary | ICD-10-CM

## 2020-08-13 MED ORDER — FLUTICASONE-SALMETEROL 115-21 MCG/ACT IN AERO
2.0000 | INHALATION_SPRAY | Freq: Two times a day (BID) | RESPIRATORY_TRACT | 2 refills | Status: DC
Start: 2020-08-13 — End: 2020-11-24

## 2020-08-31 NOTE — Progress Notes (Signed)
Cardiology Office Note:    Date:  09/02/2020   ID:  Adam Mcdonald, DOB 27-Sep-1965, MRN [id number]  PCP:  Caren Macadam, MD   [place]  Cardiologist:  Freada Bergeron, MD  Advanced Practice Provider:  No care team member to display Electrophysiologist:  None   Referring MD: Caren Macadam, MD    History of Present Illness:    Adam Mcdonald is a 55 y.o. male with a hx of depression, HTN, and OSA who was referred by Dr. Ethlyn Gallery for further management for dyspnea on exertion.  The patient states that in early December, he had flu like symptoms with COVID negative. His SOB started at that time. He went to the ER and where work-up including trop and ECGwas reassuringly normal. Had recurrent viral like symptoms thought to be COVID although not tested at that time. Had recurrent visits to ER for SOB. CTA on 08/10/20 was negative for PE, mild scattered calcifications. Was started on inhalers without significant improvement.   Prior to becoming ill, no chest pain on exertion, lightheadedness, dizziness, syncope. Was active and was able to workout without issues. Since he was sick, he is able to walk at work but is much more dyspneic on exertion. Has been needing to take more breaks due to symptoms.  No orthopnea, LE edema, PND. Has been compliant with CPAP machine.  Family history: Paternal GM: CAD; Paternal GF: CAD  Past Medical History:  Diagnosis Date  . Chicken pox   . Chronic pain   . Depression   . History of MRSA infection   . Hypertension   . Sleep apnea   . Sleep apnea   . Ventral hernia without obstruction or gangrene   . Vitamin D deficiency     Past Surgical History:  Procedure Laterality Date  . ACHILLES TENDON REPAIR    . UVULOPALATOPHARYNGOPLASTY      Current Medications: Current Meds  Medication Sig  . albuterol (VENTOLIN HFA) 108 (90 Base) MCG/ACT inhaler Inhale 2 puffs into the lungs every 6 (six) hours as needed for  wheezing or shortness of breath.  [name] buPROPion (WELLBUTRIN XL) 300 MG 24 hr tablet TAKE ONE TABLET BY MOUTH DAILY  . DULoxetine (CYMBALTA) 60 MG capsule TAKE ONE CAPSULE BY MOUTH DAILY  . fluticasone-salmeterol (ADVAIR HFA) 115-21 MCG/ACT inhaler Inhale 2 puffs into the lungs 2 (two) times daily.  [name] losartan-hydrochlorothiazide (HYZAAR) 100-25 MG tablet TAKE ONE TABLET BY MOUTH DAILY  . meloxicam (MOBIC) 15 MG tablet TAKE ONE TABLET BY MOUTH DAILY  . Multiple Vitamin (MULTI-VITAMIN DAILY PO) Take by mouth.  . Omega 3 1200 MG CAPS Take by mouth.  . rosuvastatin (CRESTOR) 10 MG tablet Take 1 tablet (10 mg total) by mouth daily.  [name] Spacer/Aero-Holding Chambers DEVI 1 Device by Does not apply route daily as needed.     Allergies:   Other and Trazodone and nefazodone   Social History   Socioeconomic History  . Marital status: Single    Spouse name: Not on file  . Number of children: Not on file  . Years of education: Not on file  . Highest education level: Not on file  Occupational History  . Not on file  Tobacco Use  . Smoking status: Never Smoker  . Smokeless tobacco: Never Used  Substance and Sexual Activity  . Alcohol use: Not Currently    Comment: nothing since June 2021  . Drug use: Never  . Sexual activity: Yes  Partners: Female  Other Topics Concern  . Not on file  Social History Narrative  . Not on file   Social Determinants of Health   Financial Resource Strain: Not on file  Food Insecurity: Not on file  Transportation Needs: Not on file  Physical Activity: Not on file  Stress: Not on file  Social Connections: Not on file     Family History: The patient's family history includes Depression in his mother; Diabetes in his mother; Heart attack (age of onset: 29) in his paternal grandfather; Heart attack (age of onset: 52) in his paternal grandmother; High blood pressure in his father; Stroke in his maternal grandfather.  ROS:   Please see the history of present  illness.    Review of Systems  Constitutional: Positive for malaise/fatigue. Negative for chills and fever.  HENT: Negative for hearing loss and sore throat.   Eyes: Negative for blurred vision and redness.  Respiratory: Positive for cough and shortness of breath.   Cardiovascular: Negative for chest pain, palpitations, orthopnea, claudication, leg swelling and PND.  Gastrointestinal: Negative for melena, nausea and vomiting.  Genitourinary: Negative for dysuria and flank pain.  Musculoskeletal: Negative for joint pain.  Neurological: Negative for dizziness and loss of consciousness.  Endo/Heme/Allergies: Negative for polydipsia.  Psychiatric/Behavioral: Negative for substance abuse.    EKGs/Labs/Other Studies Reviewed:    The following studies were reviewed today: CTA Chest 08/10/20: FINDINGS: Cardiovascular: Satisfactory opacification of the pulmonary arteries to the segmental level. No evidence of pulmonary embolism. Normal heart size. No pericardial effusion. The heart is normal in size. No pericardial effusion. Mild coronary atherosclerotic calcifications. Aortic valvular calcifications are noted.  Mediastinum/Nodes: No enlarged mediastinal, hilar, or axillary lymph nodes. Thyroid gland, trachea, and esophagus demonstrate no significant findings.  Lungs/Pleura: No focal consolidations. No suspicious pulmonary nodules. There are a few scattered punctate calcified granulomas in the right middle lobe. No pleural effusion or pneumothorax.  Upper Abdomen: Diffusely decreased attenuation of the hepatic parenchyma. No focal masses or contour abnormalities. The remaining visualized upper abdomen is within normal limits.  Musculoskeletal: No chest wall abnormality. No acute or significant osseous findings.  Review of the MIP images confirms the above findings.  IMPRESSION: Vascular:  No evidence of pulmonary embolism.  Non-Vascular:  1. No intrathoracic  abnormality to explain shortness of breath. 2. Hepatic steatosis.  EKG:  EKG is  ordered today.  The ekg ordered today demonstrates NSR, LAFB with HR 91  Recent Labs: 05/20/2020: B Natriuretic Peptide 46.6 07/21/2020: ALT 76; BUN 19; Creatinine, Ser 1.14; Hemoglobin 17.6; Platelets 317.0; Potassium 4.1; Pro B Natriuretic peptide (BNP) 29.0; Sodium 136 08/10/2020: TSH 1.167  Recent Lipid Panel    Component Value Date/Time   CHOL 168 10/24/2019 0933   TRIG 175.0 (H) 10/24/2019 0933   HDL 35.10 (L) 10/24/2019 0933   CHOLHDL 5 10/24/2019 0933   VLDL 35.0 10/24/2019 0933   LDLCALC 98 10/24/2019 0933      Physical Exam:    VS:  BP 124/86   Pulse 91   Ht 5\' 7"  (1.702 m)   Wt 257 lb 6.4 oz (116.8 kg)   SpO2 98%   BMI 40.31 kg/m     Wt Readings from Last 3 Encounters:  09/02/20 257 lb 6.4 oz (116.8 kg)  08/10/20 250 lb (113.4 kg)  07/23/20 259 lb 6.4 oz (117.7 kg)     GEN:  Well nourished, well developed in no acute distress HEENT: Normal NECK: No JVD; No carotid bruits CARDIAC: RRR,  no murmurs, rubs, gallops RESPIRATORY:  Clear to auscultation without rales, wheezing or rhonchi  ABDOMEN: Soft, non-tender, non-distended MUSCULOSKELETAL:  No edema; No deformity  SKIN: Warm and dry NEUROLOGIC:  Alert and oriented x 3 PSYCHIATRIC:  Normal affect   ASSESSMENT:    1. Shortness of breath   2. Primary hypertension   3. Mixed hyperlipidemia   4. Morbid obesity ([place])   5. Coronary artery disease involving native coronary artery of native heart without angina pectoris    PLAN:    In order of problems listed above:  #DOE: Ongoing for 3 months. 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 No improvement with inhalers. CTA with scattered coronary calcifications, but no active pulmonary disease. Given persistence of symptoms and risk factors (HTN, HLD, obesity, Ca on CTA), will check stress test and TTE for further work-up. -Check TTE -Check  CTA -If reassuring, can refer to [facility]  #HTN: Controlled -Continue losartan-HCTZ 100-25mg   #HLD: #Coronary Calcification: -Start crestor 10mg  daily -Goal LDL<70 -Repeat labs with PCP  #Obesity: BMI 40. -Talked extensively about diet and weight loss as detailed below -Patient motivated to trial intermittent fasting and Mediterranean diet  Exercise recommendations: Goal of exercising for at least 30 minutes a day, at least 5 times per week.  Please exercise to a moderate exertion.  This means that while exercising it is difficult to speak in full sentences, however you are not so short of breath that you feel you must stop, and not so comfortable that you can carry on a full conversation.  Exertion level should be approximately a 5/10, if 10 is the most exertion you can perform.  Diet recommendations: Recommend a heart healthy diet such as the Mediterranean diet.  This diet consists of plant based foods, healthy fats, lean meats, olive oil.  It suggests limiting the intake of simple carbohydrates such as white breads, pastries, and pastas.  It also limits the amount of red meat, wine, and dairy products such as cheese that one should consume on a daily basis.   Shared Decision Making/Informed Consent The risks [chest pain, shortness of breath, cardiac arrhythmias, dizziness, blood pressure fluctuations, myocardial infarction, stroke/transient ischemic attack, nausea, vomiting, allergic reaction, radiation exposure, metallic taste sensation and life-threatening complications (estimated to be 1 in 10,000)], benefits (risk stratification, diagnosing coronary artery disease, treatment guidance) and alternatives of a nuclear stress test were discussed in detail with Adam Mcdonald and he agrees to proceed.    Medication Adjustments/Labs and Tests Ordered: Current medicines are reviewed at length with the patient today.  Concerns regarding medicines are outlined above.  Orders Placed This  Encounter  Procedures  . MYOCARDIAL PERFUSION IMAGING  . EKG 12-Lead  . ECHOCARDIOGRAM COMPLETE   Meds ordered this encounter  Medications  . rosuvastatin (CRESTOR) 10 MG tablet    Sig: Take 1 tablet (10 mg total) by mouth daily.    Dispense:  90 tablet    Refill:  3    Patient Instructions   Medication Instructions:  Your physician has recommended you make the following change in your medication:  1) START taking Crestor (rosuvastatin) 10 mg daily    Testing/Procedures: Your physician has requested that you have a lexiscan myoview. For further information please visit [email or web address]. Please follow instruction sheet, as given.  Your physician has requested that you have an echocardiogram. Echocardiography is a painless test that uses sound waves to create images of your heart. It provides your doctor with information about  the size and shape of your heart and how well your heart's chambers and valves are working. This procedure takes approximately one hour. There are no restrictions for this procedure  Follow-Up: At [facility], you and your health needs are our priority.  As part of our continuing mission to provide you with exceptional heart care, we have created designated Provider Care Teams.  These Care Teams include your primary Cardiologist (physician) and Advanced Practice Providers (APPs -  Physician Assistants and Nurse Practitioners) who all work together to provide you with the care you need, when you need it.  Your next appointment:   3 month(s)  The format for your next appointment:   In Person  Provider:   You may see Freada Bergeron, MD or one of the following Advanced Practice Providers on your designated Care Team:    Richardson Dopp, PA-C  [place], [place]   Other Instructions  Mediterranean Diet A Mediterranean diet refers to food and lifestyle choices that are based on the traditions of countries located on the [employer]. This way of  eating has been shown to help prevent certain conditions and improve outcomes for people who have chronic diseases, like kidney disease and heart disease. What are tips for following this plan? Lifestyle  Cook and eat meals together with your family, when possible.  Drink enough fluid to keep your urine clear or pale yellow.  Be physically active every day. This includes: ? Aerobic exercise like running or swimming. ? Leisure activities like gardening, walking, or housework.  Get 7-8 hours of sleep each night.  If recommended by your health care provider, drink red wine in moderation. This means 1 glass a day for nonpregnant women and 2 glasses a day for men. A glass of wine equals 5 oz (150 mL). Reading food labels  Check the serving size of packaged foods. For foods such as rice and pasta, the serving size refers to the amount of cooked product, not dry.  Check the total fat in packaged foods. Avoid foods that have saturated fat or trans fats.  Check the ingredients list for added sugars, such as corn syrup.   Shopping  At the grocery store, buy most of your food from the areas near the walls of the store. This includes: ? Fresh fruits and vegetables (produce). ? Grains, beans, nuts, and seeds. Some of these may be available in unpackaged forms or large amounts (in bulk). ? Fresh seafood. ? Poultry and eggs. ? Low-fat dairy products.  Buy whole ingredients instead of prepackaged foods.  Buy fresh fruits and vegetables in-season from local farmers markets.  Buy frozen fruits and vegetables in resealable bags.  If you do not have access to quality fresh seafood, buy precooked frozen shrimp or canned fish, such as tuna, salmon, or sardines.  Buy small amounts of raw or cooked vegetables, salads, or olives from the deli or salad bar at your store.  Stock your pantry so you always have certain foods on hand, such as olive oil, canned tuna, canned tomatoes, rice, pasta, and  beans. Cooking  Cook foods with extra-virgin olive oil instead of using butter or other vegetable oils.  Have meat as a side dish, and have vegetables or grains as your main dish. This means having meat in small portions or adding small amounts of meat to foods like pasta or stew.  Use beans or vegetables instead of meat in common dishes like chili or lasagna.  Experiment with different cooking  methods. Try roasting or broiling vegetables instead of steaming or sauteing them.  Add frozen vegetables to soups, stews, pasta, or rice.  Add nuts or seeds for added healthy fat at each meal. You can add these to yogurt, salads, or vegetable dishes.  Marinate fish or vegetables using olive oil, lemon juice, garlic, and fresh herbs. Meal planning  Plan to eat 1 vegetarian meal one day each week. Try to work up to 2 vegetarian meals, if possible.  Eat seafood 2 or more times a week.  Have healthy snacks readily available, such as: ? Vegetable sticks with hummus. ? [place] yogurt. ? Fruit and nut trail mix.  Eat balanced meals throughout the week. This includes: ? Fruit: 2-3 servings a day ? Vegetables: 4-5 servings a day ? Low-fat dairy: 2 servings a day ? Fish, poultry, or lean meat: 1 serving a day ? Beans and legumes: 2 or more servings a week ? Nuts and seeds: 1-2 servings a day ? Whole grains: 6-8 servings a day ? Extra-virgin olive oil: 3-4 servings a day  Limit red meat and sweets to only a few servings a month   What are my food choices?  Mediterranean diet ? Recommended  Grains: Whole-grain pasta. Brown rice. Bulgar wheat. Polenta. Couscous. Whole-wheat bread. [place].  Vegetables: Artichokes. Beets. Broccoli. Cabbage. Carrots. Eggplant. Green beans. Chard. Kale. Spinach. Onions. Leeks. Peas. Squash. Tomatoes. Peppers. Radishes.  Fruits: Apples. Apricots. Avocado. Berries. Bananas. Cherries. Dates. Figs. Grapes. Lemons. Melon. Oranges. Peaches. Plums.  Pomegranate.  Meats and other protein foods: Beans. Almonds. Sunflower seeds. Pine nuts. Peanuts. [place]. Salmon. Scallops. Shrimp. [place]. Tilapia. Clams. Oysters. Eggs.  Dairy: Low-fat milk. Cheese. Greek yogurt.  Beverages: Water. Red wine. Herbal tea.  Fats and oils: Extra virgin olive oil. Avocado oil. Grape seed oil.  Sweets and desserts: [place] yogurt with honey. Baked apples. Poached pears. Trail mix.  Seasoning and other foods: Basil. Cilantro. Coriander. Cumin. Mint. Parsley. Sage. Rosemary. Tarragon. Garlic. Oregano. Thyme. Pepper. Balsalmic vinegar. Tahini. Hummus. Tomato sauce. Olives. Mushrooms. ? Limit these  Grains: Prepackaged pasta or rice dishes. Prepackaged cereal with added sugar.  Vegetables: Deep fried potatoes (french fries).  Fruits: Fruit canned in syrup.  Meats and other protein foods: Beef. Pork. Lamb. Poultry with skin. Hot dogs. Berniece Salines.  Dairy: Ice cream. Sour cream. Whole milk.  Beverages: Juice. Sugar-sweetened soft drinks. Beer. Liquor and spirits.  Fats and oils: Butter. Canola oil. Vegetable oil. Beef fat (tallow). Lard.  Sweets and desserts: Cookies. Cakes. Pies. Candy.  Seasoning and other foods: Mayonnaise. Premade sauces and marinades. The items listed may not be a complete list. Talk with your dietitian about what dietary choices are right for you. Summary  The Mediterranean diet includes both food and lifestyle choices.  Eat a variety of fresh fruits and vegetables, beans, nuts, seeds, and whole grains.  Limit the amount of red meat and sweets that you eat.  Talk with your health care provider about whether it is safe for you to drink red wine in moderation. This means 1 glass a day for nonpregnant women and 2 glasses a day for men. A glass of wine equals 5 oz (150 mL). This information is not intended to replace advice given to you by your health care provider. Make sure you discuss any questions you have with your health care  provider. Document Revised: 01/22/2016 Document Reviewed: 01/15/2016 Elsevier Patient Education  2020 [employer].      Signed, Freada Bergeron, MD  09/02/2020  11:48 AM    [facility]

## 2020-09-02 ENCOUNTER — Ambulatory Visit: Payer: BC Managed Care – PPO | Admitting: Cardiology

## 2020-09-02 ENCOUNTER — Other Ambulatory Visit: Payer: Self-pay

## 2020-09-02 ENCOUNTER — Encounter: Payer: Self-pay | Admitting: Cardiology

## 2020-09-02 VITALS — BP 124/86 | HR 91 | Ht 67.0 in | Wt 257.4 lb

## 2020-09-02 DIAGNOSIS — I2510 Atherosclerotic heart disease of native coronary artery without angina pectoris: Secondary | ICD-10-CM

## 2020-09-02 DIAGNOSIS — I10 Essential (primary) hypertension: Secondary | ICD-10-CM

## 2020-09-02 DIAGNOSIS — R0602 Shortness of breath: Secondary | ICD-10-CM

## 2020-09-02 DIAGNOSIS — E782 Mixed hyperlipidemia: Secondary | ICD-10-CM | POA: Diagnosis not present

## 2020-09-02 MED ORDER — ROSUVASTATIN CALCIUM 10 MG PO TABS: 10.0000 mg | ORAL_TABLET | Freq: Every day | ORAL | 3 refills | Status: DC

## 2020-09-02 NOTE — Patient Instructions (Signed)
Medication Instructions:  Your physician has recommended you make the following change in your medication:  1) START taking Crestor (rosuvastatin) 10 mg daily    Testing/Procedures: Your physician has requested that you have a lexiscan myoview. For further information please visit [email or web address]. Please follow instruction sheet, as given.  Your physician has requested that you have an echocardiogram. Echocardiography is a painless test that uses sound waves to create images of your heart. It provides your doctor with information about the size and shape of your heart and how well your heart's chambers and valves are working. This procedure takes approximately one hour. There are no restrictions for this procedure  Follow-Up: At [facility], you and your health needs are our priority.  As part of our continuing mission to provide you with exceptional heart care, we have created designated Provider Care Teams.  These Care Teams include your primary Cardiologist (physician) and Advanced Practice Providers (APPs -  Physician Assistants and Nurse Practitioners) who all work together to provide you with the care you need, when you need it.  Your next appointment:   3 month(s)  The format for your next appointment:   In Person  Provider:   You may see Freada Bergeron, MD or one of the following Advanced Practice Providers on your designated Care Team:    Richardson Dopp, PA-C  [place], [place]   Other Instructions  Mediterranean Diet A Mediterranean diet refers to food and lifestyle choices that are based on the traditions of countries located on the [employer]. This way of eating has been shown to help prevent certain conditions and improve outcomes for people who have chronic diseases, like kidney disease and heart disease. What are tips for following this plan? Lifestyle  Cook and eat meals together with your family, when possible.  Drink enough fluid to keep your  urine clear or pale yellow.  Be physically active every day. This includes: ? Aerobic exercise like running or swimming. ? Leisure activities like gardening, walking, or housework.  Get 7-8 hours of sleep each night.  If recommended by your health care provider, drink red wine in moderation. This means 1 glass a day for nonpregnant women and 2 glasses a day for men. A glass of wine equals 5 oz (150 mL). Reading food labels  Check the serving size of packaged foods. For foods such as rice and pasta, the serving size refers to the amount of cooked product, not dry.  Check the total fat in packaged foods. Avoid foods that have saturated fat or trans fats.  Check the ingredients list for added sugars, such as corn syrup.   Shopping  At the grocery store, buy most of your food from the areas near the walls of the store. This includes: ? Fresh fruits and vegetables (produce). ? Grains, beans, nuts, and seeds. Some of these may be available in unpackaged forms or large amounts (in bulk). ? Fresh seafood. ? Poultry and eggs. ? Low-fat dairy products.  Buy whole ingredients instead of prepackaged foods.  Buy fresh fruits and vegetables in-season from local farmers markets.  Buy frozen fruits and vegetables in resealable bags.  If you do not have access to quality fresh seafood, buy precooked frozen shrimp or canned fish, such as tuna, salmon, or sardines.  Buy small amounts of raw or cooked vegetables, salads, or olives from the deli or salad bar at your store.  Stock your pantry so you always have certain foods on hand, such as  olive oil, canned tuna, canned tomatoes, rice, pasta, and beans. Cooking  Cook foods with extra-virgin olive oil instead of using butter or other vegetable oils.  Have meat as a side dish, and have vegetables or grains as your main dish. This means having meat in small portions or adding small amounts of meat to foods like pasta or stew.  Use beans or  vegetables instead of meat in common dishes like chili or lasagna.  Experiment with different cooking methods. Try roasting or broiling vegetables instead of steaming or sauteing them.  Add frozen vegetables to soups, stews, pasta, or rice.  Add nuts or seeds for added healthy fat at each meal. You can add these to yogurt, salads, or vegetable dishes.  Marinate fish or vegetables using olive oil, lemon juice, garlic, and fresh herbs. Meal planning  Plan to eat 1 vegetarian meal one day each week. Try to work up to 2 vegetarian meals, if possible.  Eat seafood 2 or more times a week.  Have healthy snacks readily available, such as: ? Vegetable sticks with hummus. ? [place] yogurt. ? Fruit and nut trail mix.  Eat balanced meals throughout the week. This includes: ? Fruit: 2-3 servings a day ? Vegetables: 4-5 servings a day ? Low-fat dairy: 2 servings a day ? Fish, poultry, or lean meat: 1 serving a day ? Beans and legumes: 2 or more servings a week ? Nuts and seeds: 1-2 servings a day ? Whole grains: 6-8 servings a day ? Extra-virgin olive oil: 3-4 servings a day  Limit red meat and sweets to only a few servings a month   What are my food choices?  Mediterranean diet ? Recommended  Grains: Whole-grain pasta. Brown rice. Bulgar wheat. Polenta. Couscous. Whole-wheat bread. [place].  Vegetables: Artichokes. Beets. Broccoli. Cabbage. Carrots. Eggplant. Green beans. Chard. Kale. Spinach. Onions. Leeks. Peas. Squash. Tomatoes. Peppers. Radishes.  Fruits: Apples. Apricots. Avocado. Berries. Bananas. Cherries. Dates. Figs. Grapes. Lemons. Melon. Oranges. Peaches. Plums. Pomegranate.  Meats and other protein foods: Beans. Almonds. Sunflower seeds. Pine nuts. Peanuts. [place]. Salmon. Scallops. Shrimp. [place]. Tilapia. Clams. Oysters. Eggs.  Dairy: Low-fat milk. Cheese. Greek yogurt.  Beverages: Water. Red wine. Herbal tea.  Fats and oils: Extra virgin olive oil. Avocado oil.  Grape seed oil.  Sweets and desserts: [place] yogurt with honey. Baked apples. Poached pears. Trail mix.  Seasoning and other foods: Basil. Cilantro. Coriander. Cumin. Mint. Parsley. Sage. Rosemary. Tarragon. Garlic. Oregano. Thyme. Pepper. Balsalmic vinegar. Tahini. Hummus. Tomato sauce. Olives. Mushrooms. ? Limit these  Grains: Prepackaged pasta or rice dishes. Prepackaged cereal with added sugar.  Vegetables: Deep fried potatoes (french fries).  Fruits: Fruit canned in syrup.  Meats and other protein foods: Beef. Pork. Lamb. Poultry with skin. Hot dogs. [place] Salines.  Dairy: Ice cream. Sour cream. Whole milk.  Beverages: Juice. Sugar-sweetened soft drinks. Beer. Liquor and spirits.  Fats and oils: Butter. Canola oil. Vegetable oil. Beef fat (tallow). Lard.  Sweets and desserts: Cookies. Cakes. Pies. Candy.  Seasoning and other foods: Mayonnaise. Premade sauces and marinades. The items listed may not be a complete list. Talk with your dietitian about what dietary choices are right for you. Summary  The Mediterranean diet includes both food and lifestyle choices.  Eat a variety of fresh fruits and vegetables, beans, nuts, seeds, and whole grains.  Limit the amount of red meat and sweets that you eat.  Talk with your health care provider about whether it is safe for you to drink red wine  in moderation. This means 1 glass a day for nonpregnant women and 2 glasses a day for men. A glass of wine equals 5 oz (150 mL). This information is not intended to replace advice given to you by your health care provider. Make sure you discuss any questions you have with your health care provider. Document Revised: 01/22/2016 Document Reviewed: 01/15/2016 Elsevier Patient Education  [place].

## 2020-09-30 ENCOUNTER — Telehealth (HOSPITAL_COMMUNITY): Payer: Self-pay | Admitting: *Deleted

## 2020-09-30 NOTE — Telephone Encounter (Signed)
Patient given detailed instructions per Myocardial Perfusion Study Information Sheet for the test on 10/06/20 at 8:00. Patient notified to arrive 15 minutes early and that it is imperative to arrive on time for appointment to keep from having the test rescheduled.  If you need to cancel or reschedule your appointment, please call the office within 24 hours of your appointment. . Patient verbalized understanding.Adam Mcdonald

## 2020-10-06 ENCOUNTER — Telehealth: Payer: Self-pay | Admitting: *Deleted

## 2020-10-06 ENCOUNTER — Ambulatory Visit (HOSPITAL_COMMUNITY): Payer: BC Managed Care – PPO | Attending: Cardiovascular Disease

## 2020-10-06 ENCOUNTER — Ambulatory Visit (HOSPITAL_BASED_OUTPATIENT_CLINIC_OR_DEPARTMENT_OTHER): Payer: BC Managed Care – PPO

## 2020-10-06 ENCOUNTER — Other Ambulatory Visit: Payer: Self-pay

## 2020-10-06 DIAGNOSIS — I77819 Aortic ectasia, unspecified site: Secondary | ICD-10-CM

## 2020-10-06 DIAGNOSIS — R0602 Shortness of breath: Secondary | ICD-10-CM | POA: Insufficient documentation

## 2020-10-06 DIAGNOSIS — I2510 Atherosclerotic heart disease of native coronary artery without angina pectoris: Secondary | ICD-10-CM

## 2020-10-06 LAB — MYOCARDIAL PERFUSION IMAGING
LV dias vol: 80 mL (ref 62–150)
LV sys vol: 38 mL
Peak HR: 96 {beats}/min
Rest HR: 80 {beats}/min
SDS: 0
SRS: 0
SSS: 0
TID: 1.02

## 2020-10-06 LAB — ECHOCARDIOGRAM COMPLETE
Area-P 1/2: 2.34 cm2
Height: 67 in
S' Lateral: 3 cm
Weight: 4112 oz

## 2020-10-06 MED ORDER — REGADENOSON 0.4 MG/5ML IV SOLN
0.4000 mg | Freq: Once | INTRAVENOUS | Status: AC
  Administered 2020-10-06: 0.4 mg via INTRAVENOUS

## 2020-10-06 MED ORDER — TECHNETIUM TC 99M TETROFOSMIN IV KIT
31.7000 | PACK | Freq: Once | INTRAVENOUS | Status: AC | PRN
  Administered 2020-10-06: 31.7 via INTRAVENOUS
  Filled 2020-10-06: qty 32

## 2020-10-06 MED ORDER — TECHNETIUM TC 99M TETROFOSMIN IV KIT
10.5000 | PACK | Freq: Once | INTRAVENOUS | Status: AC | PRN
  Administered 2020-10-06: 10.5 via INTRAVENOUS
  Filled 2020-10-06: qty 11

## 2020-10-06 NOTE — Telephone Encounter (Signed)
-----   Message from Freada Bergeron, MD sent at 10/06/2020  2:49 PM EDT ----- The patient's echo looks great with normal pumping function and no significant valve disease. His aorta is mildly dilated which we will monitor closely with either yearly echoes or CT scans. We just need to ensure his blood pressure is well controlled with goal <120s/80s. If it is not controlled, we can adjust his blood pressure medications.

## 2020-10-06 NOTE — Telephone Encounter (Signed)
Pt made aware of echo results and recommendations per Dr. Johney Frame, for him to have this repeated in one year to follow-up on his dilated aorta.  Order for repeat echo is in the system and pt is aware that our Echo Scheduler will call him back to arrange this appt for one year out.  Pt verbalized understanding and agrees with this plan.

## 2020-10-14 ENCOUNTER — Other Ambulatory Visit: Payer: Self-pay | Admitting: Family Medicine

## 2020-10-14 DIAGNOSIS — F321 Major depressive disorder, single episode, moderate: Secondary | ICD-10-CM

## 2020-10-14 DIAGNOSIS — F339 Major depressive disorder, recurrent, unspecified: Secondary | ICD-10-CM

## 2020-10-14 DIAGNOSIS — G8929 Other chronic pain: Secondary | ICD-10-CM

## 2020-10-20 ENCOUNTER — Ambulatory Visit: Payer: BC Managed Care – PPO | Admitting: Family Medicine

## 2020-11-09 ENCOUNTER — Encounter: Payer: Self-pay | Admitting: Family Medicine

## 2020-11-12 ENCOUNTER — Other Ambulatory Visit: Payer: Self-pay | Admitting: Family Medicine

## 2020-11-12 DIAGNOSIS — R748 Abnormal levels of other serum enzymes: Secondary | ICD-10-CM

## 2020-11-12 DIAGNOSIS — E1165 Type 2 diabetes mellitus with hyperglycemia: Secondary | ICD-10-CM

## 2020-11-12 DIAGNOSIS — E785 Hyperlipidemia, unspecified: Secondary | ICD-10-CM

## 2020-11-12 DIAGNOSIS — D72829 Elevated white blood cell count, unspecified: Secondary | ICD-10-CM

## 2020-11-12 NOTE — Telephone Encounter (Signed)
Patient is scheduled 06/13

## 2020-11-14 ENCOUNTER — Other Ambulatory Visit: Payer: Self-pay

## 2020-11-17 ENCOUNTER — Other Ambulatory Visit: Payer: Self-pay

## 2020-11-17 ENCOUNTER — Other Ambulatory Visit (INDEPENDENT_AMBULATORY_CARE_PROVIDER_SITE_OTHER): Payer: BC Managed Care – PPO

## 2020-11-17 DIAGNOSIS — E1169 Type 2 diabetes mellitus with other specified complication: Secondary | ICD-10-CM

## 2020-11-17 DIAGNOSIS — E785 Hyperlipidemia, unspecified: Secondary | ICD-10-CM | POA: Diagnosis not present

## 2020-11-17 DIAGNOSIS — R748 Abnormal levels of other serum enzymes: Secondary | ICD-10-CM | POA: Diagnosis not present

## 2020-11-17 DIAGNOSIS — D72829 Elevated white blood cell count, unspecified: Secondary | ICD-10-CM | POA: Diagnosis not present

## 2020-11-17 DIAGNOSIS — E1165 Type 2 diabetes mellitus with hyperglycemia: Secondary | ICD-10-CM

## 2020-11-17 LAB — COMPREHENSIVE METABOLIC PANEL
ALT: 54 U/L — ABNORMAL HIGH (ref 0–53)
AST: 31 U/L (ref 0–37)
Albumin: 4.7 g/dL (ref 3.5–5.2)
Alkaline Phosphatase: 63 U/L (ref 39–117)
BUN: 19 mg/dL (ref 6–23)
CO2: 29 mEq/L (ref 19–32)
Calcium: 9.6 mg/dL (ref 8.4–10.5)
Chloride: 97 mEq/L (ref 96–112)
Creatinine, Ser: 1.08 mg/dL (ref 0.40–1.50)
GFR: 78.63 mL/min (ref >60.00)
Glucose, Bld: 142 mg/dL — ABNORMAL HIGH (ref 70–99)
Potassium: 4.7 mEq/L (ref 3.5–5.1)
Sodium: 135 mEq/L (ref 135–145)
Total Bilirubin: 0.7 mg/dL (ref 0.2–1.2)
Total Protein: 7.4 g/dL (ref 6.0–8.3)

## 2020-11-17 LAB — CBC WITH DIFFERENTIAL/PLATELET
Basophils Absolute: 0.1 10*3/uL (ref 0.0–0.1)
Basophils Relative: 0.6 % (ref 0.0–3.0)
Eosinophils Absolute: 0.1 10*3/uL (ref 0.0–0.7)
Eosinophils Relative: 1.5 % (ref 0.0–5.0)
HCT: 49.1 % (ref 39.0–52.0)
Hemoglobin: 17.1 g/dL — ABNORMAL HIGH (ref 13.0–17.0)
Lymphocytes Relative: 26 % (ref 12.0–46.0)
Lymphs Abs: 2.2 10*3/uL (ref 0.7–4.0)
MCHC: 34.8 g/dL (ref 30.0–36.0)
MCV: 90.7 fl (ref 78.0–100.0)
Monocytes Absolute: 0.6 10*3/uL (ref 0.1–1.0)
Monocytes Relative: 7.5 % (ref 3.0–12.0)
Neutro Abs: 5.5 10*3/uL (ref 1.4–7.7)
Neutrophils Relative %: 64.4 % (ref 43.0–77.0)
Platelets: 297 10*3/uL (ref 150.0–400.0)
RBC: 5.42 Mil/uL (ref 4.22–5.81)
RDW: 12.9 % (ref 11.5–15.5)
WBC: 8.5 10*3/uL (ref 4.0–10.5)

## 2020-11-17 LAB — LIPID PANEL
Cholesterol: 114 mg/dL (ref 0–200)
HDL: 31.5 mg/dL — ABNORMAL LOW (ref >39.00)
LDL Cholesterol: 50 mg/dL (ref 0–99)
NonHDL: 82.48
Total CHOL/HDL Ratio: 4
Triglycerides: 161 mg/dL — ABNORMAL HIGH (ref 0.0–149.0)
VLDL: 32.2 mg/dL (ref 0.0–40.0)

## 2020-11-17 LAB — HEMOGLOBIN A1C: Hgb A1c MFr Bld: 6.7 % — ABNORMAL HIGH (ref 4.6–6.5)

## 2020-11-24 ENCOUNTER — Other Ambulatory Visit: Payer: Self-pay | Admitting: Family Medicine

## 2020-11-24 ENCOUNTER — Ambulatory Visit: Payer: BC Managed Care – PPO | Admitting: Family Medicine

## 2020-11-24 ENCOUNTER — Encounter: Payer: Self-pay | Admitting: Family Medicine

## 2020-11-24 ENCOUNTER — Other Ambulatory Visit: Payer: Self-pay

## 2020-11-24 VITALS — BP 120/80 | HR 94 | Temp 98.3°F | Ht 67.0 in | Wt 256.0 lb

## 2020-11-24 DIAGNOSIS — G4733 Obstructive sleep apnea (adult) (pediatric): Secondary | ICD-10-CM | POA: Diagnosis not present

## 2020-11-24 DIAGNOSIS — F339 Major depressive disorder, recurrent, unspecified: Secondary | ICD-10-CM | POA: Diagnosis not present

## 2020-11-24 DIAGNOSIS — I10 Essential (primary) hypertension: Secondary | ICD-10-CM

## 2020-11-24 DIAGNOSIS — D239 Other benign neoplasm of skin, unspecified: Secondary | ICD-10-CM

## 2020-11-24 DIAGNOSIS — L720 Epidermal cyst: Secondary | ICD-10-CM | POA: Diagnosis not present

## 2020-11-24 DIAGNOSIS — E1165 Type 2 diabetes mellitus with hyperglycemia: Secondary | ICD-10-CM

## 2020-11-24 DIAGNOSIS — L989 Disorder of the skin and subcutaneous tissue, unspecified: Secondary | ICD-10-CM

## 2020-11-24 DIAGNOSIS — D2339 Other benign neoplasm of skin of other parts of face: Secondary | ICD-10-CM | POA: Diagnosis not present

## 2020-11-24 NOTE — Progress Notes (Signed)
Adam Mcdonald DOB: [birth year] Encounter date: 11/24/2020  This is a 55 y.o. male who presents with Chief Complaint  Patient presents with   Follow-up    3 month chronic condition     History of present illness:  Has been really hard at work. Dealing with boss has been terrible.was looking for way to transfer, but that didn't work out for him. Working to try to make things better and also realizing what may be best way for him to handle work stress.   Eating hasn't been good - cut out bread, rice, pasta, sugar drinks. But what he noticed a few weeks ago - had higher carb day of eating and woke up in the middle of the night super thirsty. Had to drink half gallon water to help him.    HL: on crestor - did have some muscle discomfort in the first few weeks; feels better now.   Hard to calorie restrict now - more difficult to loose weight.   OSA: wears mask nightly. Wants another test; but wants to loose weight before doing this.   Shortness of breath is gettng less frequent; stopped the advair. Then noticed the shortness of breath returned when walking in door at work in morning. Very minor. Usually just having episodes once a week. Didn't feel like he needed to follow up with lung doc.   Has some bumps under beard on face. Had a sore on upper lip that was pretty significant which was treated with antibiotics. Noted bumps on face after this sore and after shaving.   Sweating a lot at work.   Allergies  Allergen Reactions   Other     Thimerosal (eyes only)   Trazodone And Nefazodone     Dry mouth   Current Meds  Medication Sig   buPROPion (WELLBUTRIN XL) 300 MG 24 hr tablet TAKE ONE TABLET BY MOUTH DAILY   DULoxetine (CYMBALTA) 60 MG capsule TAKE ONE CAPSULE BY MOUTH DAILY   losartan-hydrochlorothiazide (HYZAAR) 100-25 MG tablet TAKE ONE TABLET BY MOUTH DAILY   meloxicam (MOBIC) 15 MG tablet TAKE ONE TABLET BY MOUTH DAILY   Multiple Vitamin (MULTI-VITAMIN DAILY PO) Take by  mouth.   Omega 3 1200 MG CAPS Take by mouth.   rosuvastatin (CRESTOR) 10 MG tablet Take 1 tablet (10 mg total) by mouth daily.    Review of Systems  Constitutional:  Positive for fatigue. Negative for chills and fever.  Respiratory:  Negative for cough, chest tightness, shortness of breath (feels significantly better) and wheezing.   Cardiovascular:  Negative for chest pain, palpitations and leg swelling.  Skin:  Positive for rash.  Psychiatric/Behavioral:  Positive for agitation (notes mostly at work). The patient is nervous/anxious.    Objective:  BP 120/80   Pulse 94   Temp 98.3 F (36.8 C) (Oral)   Ht 5\' 7"  (1.702 m)   Wt 256 lb (116.1 kg)   SpO2 96%   BMI 40.10 kg/m   Weight: 256 lb (116.1 kg)   BP Readings from Last 3 Encounters:  11/24/20 120/80  09/02/20 124/86  08/10/20 140/82   Wt Readings from Last 3 Encounters:  11/24/20 256 lb (116.1 kg)  10/06/20 257 lb (116.6 kg)  09/02/20 257 lb 6.4 oz (116.8 kg)    Physical Exam Constitutional:      General: He is not in acute distress.    Appearance: He is well-developed. He is obese.  Cardiovascular:     Rate and Rhythm: Normal rate and regular  rhythm.     Heart sounds: Normal heart sounds. No murmur heard.   No friction rub.  Pulmonary:     Effort: Pulmonary effort is normal. No respiratory distress.     Breath sounds: Normal breath sounds. No wheezing or rales.  Musculoskeletal:     Right lower leg: No edema.     Left lower leg: No edema.  Skin:    Comments: Scatter pink papules over face - 1-2 mm size. Noted on cheeks, forehead  Neurological:     Mental Status: He is alert and oriented to person, place, and time.  Psychiatric:        Behavior: Behavior normal.    Shave Biopsy Procedure Note  Pre-operative Diagnosis: Suspicious lesion  Post-operative Diagnosis: same  Locations: right face  1.53mmx1.5mm  Anesthesia: Lidocaine 1% with epinephrine   Procedure Details  Patient informed of the risks,  including bleeding and infection, and benefits of the  procedure and Verbal informed consent obtained. The lesion and surrounding area was given a sterile prep using chloraprep and draped in the usual sterile fashion. The skin lesion was shaved superficially using a dermablade.  Hemostasis of biopsy site was achieved using aluminum chloride. Sterile dressing applied. The specimen was sent for pathologic examination. The patient tolerated the procedure well.  Complications: none.  Plan: Wound is superficial; can use antibiotic ointment to help with healing. Do not need to keep covered, but keep clean. Let me know if redness, drainage, pain.  4. Will contact patient with pathology results when obtained.   Assessment/Plan  1. Primary hypertension Blood pressure is well controlled; continue current meds.   2. Controlled type 2 diabetes mellitus with hyperglycemia, without long-term current use of insulin ([place]) Although he does not want to be on additional medications; we discussed potential weight loss benefits with rybelsus and he is interested in trying this medication (would be agreeable to injection if insurance coverage is better). 3mg  samples given today and he will update me med tolerance in 3 weeks time. Discussed new medication(s) today with patient. Discussed potential side effects and patient verbalized understanding.   3. Depression, recurrent ([place]) Struggles with mood, but has seemed to realize  working flow now. We are going to work on sugar control; getting back on track with health and will continue to monitor mood. Continue with ewllbutrin 300mg  and cymbalta 60mg .   4. Obstructive sleep apnea Compliant with cpap. Continue with this. He would like to repeat sleep study, but wants to loose weight first   5. Facial lesion Biopsy completed today. Multiple pink papules - lesion biopsied had slightly more erythema. With sudden eruption of multiple papules wonder if viral lesions  although not clear with visual inspection.  - Dermatology pathology   Return in about 3 months (around 02/24/2021) for Chronic condition visit.      Micheline Rough, MD

## 2020-11-25 MED ORDER — RYBELSUS 3 MG PO TABS: 3.0000 mg | ORAL_TABLET | Freq: Every day | ORAL | 0 refills | Status: DC

## 2020-12-02 ENCOUNTER — Other Ambulatory Visit: Payer: Self-pay

## 2020-12-02 ENCOUNTER — Encounter: Payer: Self-pay | Admitting: Podiatry

## 2020-12-02 ENCOUNTER — Ambulatory Visit (INDEPENDENT_AMBULATORY_CARE_PROVIDER_SITE_OTHER): Payer: BC Managed Care – PPO

## 2020-12-02 ENCOUNTER — Ambulatory Visit: Payer: BC Managed Care – PPO | Admitting: Podiatry

## 2020-12-02 DIAGNOSIS — M7662 Achilles tendinitis, left leg: Secondary | ICD-10-CM | POA: Diagnosis not present

## 2020-12-02 MED ORDER — METHYLPREDNISOLONE 4 MG PO TBPK: ORAL_TABLET | ORAL | 0 refills | Status: DC

## 2020-12-02 MED ORDER — DEXAMETHASONE SODIUM PHOSPHATE 120 MG/30ML IJ SOLN
2.0000 mg | Freq: Once | INTRAMUSCULAR | Status: AC
  Administered 2020-12-02: 2 mg via INTRA_ARTICULAR

## 2020-12-02 NOTE — Progress Notes (Signed)
He presents today for follow-up of his posterior heel pain.  States that he was in 100% and doing great states that he has been having to do more at work recently pushing and pulling and has started to hurt for about a month now he is referring to his Achilles.  He states that it does not hurt in the same spot it does hurt swells more deep more anterior portion of the Achilles as he points to the area.  Denies any trauma however.  Objective: Vital signs are stable he is alert oriented x3 pulses are palpable.  Neurologic sensorium is intact Deetjen reflex are intact muscle strength is normal symmetrical.  He has tenderness on palpation of the Achilles but on the anterior side of the Achilles not on the surgical side of the Achilles.  Radiographs taken today demonstrate an osseously mature individual with looking at the Achilles does demonstrate a thickened Achilles with some calcification or spurring of the anterior Achilles just superior to the calcaneus but at the insertion site.  Assessment: Achilles tendinitis.  Plan: Discussed etiology pathology and surgical therapies at this point injected 2 mg of dexamethasone local anesthetic to the point of maximal tenderness making sure not to inject into the tendon itself.  Also start him on a Medrol Dosepak also wrote him a note for light duty.  I am going to recommend physical therapy on next visit or possibly MRI if worsening.  I will follow-up with him in 1 month should he have questions or concerns he will notify us immediately.

## 2020-12-14 ENCOUNTER — Encounter: Payer: Self-pay | Admitting: Family Medicine

## 2020-12-15 ENCOUNTER — Encounter: Payer: Self-pay | Admitting: Podiatry

## 2020-12-15 ENCOUNTER — Encounter: Payer: Self-pay | Admitting: Family Medicine

## 2020-12-15 ENCOUNTER — Other Ambulatory Visit: Payer: Self-pay | Admitting: Family Medicine

## 2020-12-15 DIAGNOSIS — E119 Type 2 diabetes mellitus without complications: Secondary | ICD-10-CM | POA: Insufficient documentation

## 2020-12-15 MED ORDER — OZEMPIC (0.25 OR 0.5 MG/DOSE) 2 MG/1.5ML ~~LOC~~ SOPN: PEN_INJECTOR | SUBCUTANEOUS | 1 refills | Status: DC

## 2020-12-16 ENCOUNTER — Encounter: Payer: Self-pay | Admitting: Podiatry

## 2020-12-17 NOTE — Progress Notes (Signed)
Cardiology Office Note:    Date:  12/22/2020   ID:  Adam Mcdonald, DOB 1966/05/01, MRN [id number]  PCP:  Caren Macadam, MD   [place]  Cardiologist:  Freada Bergeron, MD  Advanced Practice Provider:  No care team member to display Electrophysiologist:  None   Referring MD: Caren Macadam, MD    History of Present Illness:    Adam Mcdonald is a 55 y.o. male with a hx of depression, HTN, and OSA who presents to clinic for follow-up.  Last seen in clinic on 09/02/20 where he was suffering from persistent SOB after recent viral illness. CTA on 08/10/20 was negative for PE, mild scattered calcifications. TTE 10/06/20 with LVEF 60-65%, G1DD, dilated aortic root 62mm; ascending aorta 69mm. Myoview 10/06/20 with no ischemia or infarction.  Today, the patient states that his shortness of breath has gotten significantly better. No chest pain, lightheadedness, dizziness, or syncope. Blood pressure is well controlled. Has been trying intermittent fasting and is more committed to staying adherent to the diet. Also working on trying to increase exercise.  Family history: Paternal GM: CAD; Paternal GF: CAD  Past Medical History:  Diagnosis Date   Chicken pox    Chronic pain    Depression    History of MRSA infection    Hypertension    Sleep apnea    Sleep apnea    Ventral hernia without obstruction or gangrene    Vitamin D deficiency     Past Surgical History:  Procedure Laterality Date   ACHILLES TENDON REPAIR     UVULOPALATOPHARYNGOPLASTY      Current Medications: Current Meds  Medication Sig   Acetaminophen (TYLENOL PO) Take by mouth as needed.   buPROPion (WELLBUTRIN XL) 300 MG 24 hr tablet TAKE ONE TABLET BY MOUTH DAILY   Ibuprofen (ADVIL PO) Take by mouth as needed.   losartan-hydrochlorothiazide (HYZAAR) 100-25 MG tablet TAKE ONE TABLET BY MOUTH DAILY   Multiple Vitamin (MULTI-VITAMIN DAILY PO) Take by mouth.   Omega 3 1200 MG CAPS  Take by mouth.   rosuvastatin (CRESTOR) 10 MG tablet Take 1 tablet (10 mg total) by mouth daily.   Semaglutide,0.25 or 0.5MG /DOS, (OZEMPIC, 0.25 OR 0.5 MG/DOSE,) 2 MG/1.5ML SOPN Start with 0.25mg  [place] weekly x 4 weeks, then increase to 0.5mg  [place] weekly     Allergies:   Other and Trazodone and nefazodone   Social History   Socioeconomic History   Marital status: Single    Spouse name: Not on file   Number of children: Not on file   Years of education: Not on file   Highest education level: Not on file  Occupational History   Not on file  Tobacco Use   Smoking status: Never   Smokeless tobacco: Never  Substance and Sexual Activity   Alcohol use: Not Currently    Comment: nothing since June 2021   Drug use: Never   Sexual activity: Yes    Partners: Female  Other Topics Concern   Not on file  Social History Narrative   Not on file   Social Determinants of Health   Financial Resource Strain: Not on file  Food Insecurity: Not on file  Transportation Needs: Not on file  Physical Activity: Not on file  Stress: Not on file  Social Connections: Not on file     Family History: The patient's family history includes Depression in his mother; Diabetes in his mother; Heart attack (age of onset: 70) in his  paternal grandfather; Heart attack (age of onset: 26) in his paternal grandmother; High blood pressure in his father; Stroke in his maternal grandfather.  ROS:   Please see the history of present illness.    Review of Systems  Constitutional:  Negative for chills, fever and malaise/fatigue.  HENT:  Negative for hearing loss and sore throat.   Eyes:  Negative for blurred vision and redness.  Respiratory:  Positive for shortness of breath. Negative for cough.   Cardiovascular:  Negative for chest pain, palpitations, orthopnea, claudication, leg swelling and PND.  Gastrointestinal:  Negative for melena, nausea and vomiting.  Genitourinary:  Negative for dysuria and flank pain.   Musculoskeletal:  Positive for myalgias. Negative for joint pain.  Neurological:  Negative for dizziness and loss of consciousness.  Endo/Heme/Allergies:  Negative for polydipsia.  Psychiatric/Behavioral:  Negative for substance abuse.    EKGs/Labs/Other Studies Reviewed:    The following studies were reviewed today: TTE 2020/10/07: IMPRESSIONS   1. Left ventricular ejection fraction, by estimation, is 60 to 65%. The  left ventricle has normal function. The left ventricle has no regional  wall motion abnormalities. There is mild concentric left ventricular  hypertrophy. Left ventricular diastolic  parameters are consistent with Grade I diastolic dysfunction (impaired  relaxation).   2. Right ventricular systolic function is normal. The right ventricular  size is normal. There is normal pulmonary artery systolic pressure.   3. The mitral valve is normal in structure. Trivial mitral valve  regurgitation. No evidence of mitral stenosis.   4. The aortic valve is tricuspid. There is mild calcification of the  aortic valve. There is mild thickening of the aortic valve. Aortic valve  regurgitation is not visualized. No aortic stenosis is present.   5. Aortic dilatation noted. There is mild dilatation of the ascending  aorta, measuring 38 mm. There is mild dilatation of the aortic root,  measuring 40 mm.   6. The inferior vena cava is normal in size with greater than 50%  respiratory variability, suggesting right atrial pressure of 3 mmHg.   Myoview 2020/10/07: The left ventricular ejection fraction is mildly decreased (45-54%). Nuclear stress EF: 52%. There was no ST segment deviation noted during stress. The study is normal. This is a low risk study.   Normal stress nuclear study with no ischemia or infarction.  Gated ejection fraction low normal at 52% with normal wall motion.  CTA Chest 08/10/20: FINDINGS: Cardiovascular: Satisfactory opacification of the pulmonary arteries to the  segmental level. No evidence of pulmonary embolism. Normal heart size. No pericardial effusion. The heart is normal in size. No pericardial effusion. Mild coronary atherosclerotic calcifications. Aortic valvular calcifications are noted.   Mediastinum/Nodes: No enlarged mediastinal, hilar, or axillary lymph nodes. Thyroid gland, trachea, and esophagus demonstrate no significant findings.   Lungs/Pleura: No focal consolidations. No suspicious pulmonary nodules. There are a few scattered punctate calcified granulomas in the right middle lobe. No pleural effusion or pneumothorax.   Upper Abdomen: Diffusely decreased attenuation of the hepatic parenchyma. No focal masses or contour abnormalities. The remaining visualized upper abdomen is within normal limits.   Musculoskeletal: No chest wall abnormality. No acute or significant osseous findings.   Review of the MIP images confirms the above findings.   IMPRESSION: Vascular:   No evidence of pulmonary embolism.   Non-Vascular:   1. No intrathoracic abnormality to explain shortness of breath. 2. Hepatic steatosis.  EKG:  EKG is  ordered today.  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  Recent Labs: 05/20/2020: B Natriuretic Peptide 46.6 07/21/2020: Pro B Natriuretic peptide (BNP) 29.0 08/10/2020: TSH 1.167 11/17/2020: ALT 54; BUN 19; Creatinine, Ser 1.08; Hemoglobin 17.1; Platelets 297.0; Potassium 4.7; Sodium 135  Recent Lipid Panel    Component Value Date/Time   CHOL 114 11/17/2020 0709   TRIG 161.0 (H) 11/17/2020 0709   HDL 31.50 (L) 11/17/2020 0709   CHOLHDL 4 11/17/2020 0709   VLDL 32.2 11/17/2020 0709   LDLCALC 50 11/17/2020 0709      Physical Exam:    VS:  BP 128/80   Pulse 91   Ht 5\' 7"  (1.702 m)   Wt 257 lb (116.6 kg)   SpO2 97%   BMI 40.25 kg/m     Wt Readings from Last 3 Encounters:  12/22/20 257 lb (116.6 kg)  11/24/20 256 lb (116.1 kg)  10/06/20 257 lb (116.6 kg)     GEN:   Comfortable, NAD HEENT: Normal NECK: No JVD; No carotid bruits CARDIAC: RRR, no murmurs, rubs, gallops RESPIRATORY:  Clear to auscultation without rales, wheezing or rhonchi  ABDOMEN: Obese, soft, non-tender, non-distended MUSCULOSKELETAL:  No edema; No deformity  SKIN: Warm and dry NEUROLOGIC:  Alert and oriented x 3 PSYCHIATRIC:  Normal affect   ASSESSMENT:    1. Shortness of breath   2. Morbid obesity ([place])   3. Dilation of aorta (H[facility])   4. Primary hypertension   5. Coronary artery disease involving native coronary artery of native heart without angina pectoris   6. Mixed hyperlipidemia     PLAN:    In order of problems listed above:  #DOE: Significantly improved. Reassuring work-up with myoview negative for ischemia and TTE with normal LVEF, no significant valve disease.  -Continue diet and weight loss efforts  #HTN: Controlled and at goal <120s/80s. -Continue losartan-HCTZ 100-25mg   #HLD: #Coronary Calcification: LDL well controlled at 50 (goal M70) in 11/2020 -Continue crestor 10mg  daily -Goal LDL<70  #Aortic root and Ascending aorta dilation: Root measures 55mm, ascending aorta 94mm. -Plan for yearly surveillance with next 10/2021  #Obesity: BMI 40. -Discussed diet and weight loss; he is trying intermittent fasting -Started ozempic per PCP  Exercise recommendations: Goal of exercising for at least 30 minutes a day, at least 5 times per week.  Please exercise to a moderate exertion.  This means that while exercising it is difficult to speak in full sentences, however you are not so short of breath that you feel you must stop, and not so comfortable that you can carry on a full conversation.  Exertion level should be approximately a 5/10, if 10 is the most exertion you can perform.  Diet recommendations: Recommend a heart healthy diet such as the Mediterranean diet.  This diet consists of plant based foods, healthy fats, lean meats, olive oil.  It suggests  limiting the intake of simple carbohydrates such as white breads, pastries, and pastas.  It also limits the amount of red meat, wine, and dairy products such as cheese that one should consume on a daily basis.   Medication Adjustments/Labs and Tests Ordered: Current medicines are reviewed at length with the patient today.  Concerns regarding medicines are outlined above.  No orders of the defined types were placed in this encounter.  No orders of the defined types were placed in this encounter.   Patient Instructions  Medication Instructions:  Your physician recommends that you continue on your current medications as directed. Please refer to the Current Medication list given to  you today.  *If you need a refill on your cardiac medications before your next appointment, please call your pharmacy*   Lab Work: None ordered  If you have labs (blood work) drawn today and your tests are completely normal, you will receive your results only by: [place] (if you have MyChart) OR A paper copy in the mail If you have any lab test that is abnormal or we need to change your treatment, we will call you to review the results.   Testing/Procedures: None ordered   Follow-Up: At [facility], you and your health needs are our priority.  As part of our continuing mission to provide you with exceptional heart care, we have created designated Provider Care Teams.  These Care Teams include your primary Cardiologist (physician) and Advanced Practice Providers (APPs -  Physician Assistants and Nurse Practitioners) who all work together to provide you with the care you need, when you need it.  We recommend signing up for the patient portal called "MyChart".  Sign up information is provided on this After Visit Summary.  MyChart is used to connect with patients for Virtual Visits (Telemedicine).  Patients are able to view lab/test results, encounter notes, upcoming appointments, etc.  Non-urgent  messages can be sent to your provider as well.   To learn more about what you can do with MyChart, go to [email or web address].    Your next appointment:   12 month(s)  The format for your next appointment:   In Person  Provider:   You may see Freada Bergeron, MD or one of the following Advanced Practice Providers on your designated Care Team:   Richardson Dopp, PA-C Robbie Lis, [place]   Other Instructions    Signed, Freada Bergeron, MD  12/22/2020 8:45 AM    [place]

## 2020-12-22 ENCOUNTER — Ambulatory Visit (INDEPENDENT_AMBULATORY_CARE_PROVIDER_SITE_OTHER): Payer: BC Managed Care – PPO | Admitting: Cardiology

## 2020-12-22 ENCOUNTER — Encounter: Payer: Self-pay | Admitting: Cardiology

## 2020-12-22 ENCOUNTER — Other Ambulatory Visit: Payer: Self-pay

## 2020-12-22 VITALS — BP 128/80 | HR 91 | Ht 67.0 in | Wt 257.0 lb

## 2020-12-22 DIAGNOSIS — I10 Essential (primary) hypertension: Secondary | ICD-10-CM

## 2020-12-22 DIAGNOSIS — R0602 Shortness of breath: Secondary | ICD-10-CM

## 2020-12-22 DIAGNOSIS — I77819 Aortic ectasia, unspecified site: Secondary | ICD-10-CM | POA: Diagnosis not present

## 2020-12-22 DIAGNOSIS — I2510 Atherosclerotic heart disease of native coronary artery without angina pectoris: Secondary | ICD-10-CM

## 2020-12-22 DIAGNOSIS — E782 Mixed hyperlipidemia: Secondary | ICD-10-CM

## 2020-12-22 NOTE — Patient Instructions (Signed)
Medication Instructions:  Your physician recommends that you continue on your current medications as directed. Please refer to the Current Medication list given to you today.  *If you need a refill on your cardiac medications before your next appointment, please call your pharmacy*   Lab Work: None ordered  If you have labs (blood work) drawn today and your tests are completely normal, you will receive your results only by: [place] (if you have MyChart) OR A paper copy in the mail If you have any lab test that is abnormal or we need to change your treatment, we will call you to review the results.   Testing/Procedures: None ordered   Follow-Up: At [facility], you and your health needs are our priority.  As part of our continuing mission to provide you with exceptional heart care, we have created designated Provider Care Teams.  These Care Teams include your primary Cardiologist (physician) and Advanced Practice Providers (APPs -  Physician Assistants and Nurse Practitioners) who all work together to provide you with the care you need, when you need it.  We recommend signing up for the patient portal called "MyChart".  Sign up information is provided on this After Visit Summary.  MyChart is used to connect with patients for Virtual Visits (Telemedicine).  Patients are able to view lab/test results, encounter notes, upcoming appointments, etc.  Non-urgent messages can be sent to your provider as well.   To learn more about what you can do with MyChart, go to [email or web address].    Your next appointment:   12 month(s)  The format for your next appointment:   In Person  Provider:   You may see Freada Bergeron, MD or one of the following Advanced Practice Providers on your designated Care Team:   Richardson Dopp, PA-C Robbie Lis, [place]   Other Instructions

## 2020-12-23 ENCOUNTER — Encounter: Payer: Self-pay | Admitting: Family Medicine

## 2021-01-06 ENCOUNTER — Ambulatory Visit: Payer: BC Managed Care – PPO | Admitting: Podiatry

## 2021-01-06 ENCOUNTER — Encounter: Payer: Self-pay | Admitting: Podiatry

## 2021-01-06 ENCOUNTER — Other Ambulatory Visit: Payer: Self-pay

## 2021-01-06 DIAGNOSIS — M7662 Achilles tendinitis, left leg: Secondary | ICD-10-CM

## 2021-01-06 NOTE — Progress Notes (Signed)
He presents today date of surgery was in 2020 but he is still on restrictions at work.  States that I am better but I am concerned about if I need to return to all of my activities at work.  He states that the medial ankle is starting to do much better but he has had some tenderness along the lateral ankle.  He has still has some tenderness in the Achilles area.  Objective: Vital signs are stable he is alert and oriented x3.  Pulses are palpable.  The majority of his pain in his ankle was felt with short dorsiflexion though he has good plantar flexion against resistance.  Inversion against resistance and abduction as well as abduction and eversion all with no pain.  Assessment: Well-healing surgical foot and ankle.  Plan: Secondary to the mild equinus I think a small heel lift will help alleviate some of his compensation.  I provided him with quarter inch heel lifts today and we will get him into a set of orthotics casted in neutral with 1/8 inch heel lift bilaterally.  He will follow-up with a tech or EJ for that casting.

## 2021-01-07 ENCOUNTER — Telehealth: Payer: Self-pay | Admitting: Podiatry

## 2021-01-07 ENCOUNTER — Encounter: Payer: Self-pay | Admitting: Podiatry

## 2021-01-07 NOTE — Telephone Encounter (Signed)
Patient called to follow up on work restrictions note-doesn't have an appointment until 09/02 to get casted for orthotics. So basically he needs an end date for the work restrictions.

## 2021-01-08 ENCOUNTER — Encounter: Payer: Self-pay | Admitting: Podiatry

## 2021-01-12 ENCOUNTER — Other Ambulatory Visit: Payer: Self-pay | Admitting: Family Medicine

## 2021-01-12 DIAGNOSIS — F321 Major depressive disorder, single episode, moderate: Secondary | ICD-10-CM

## 2021-01-12 DIAGNOSIS — G8929 Other chronic pain: Secondary | ICD-10-CM

## 2021-01-20 ENCOUNTER — Encounter: Payer: Self-pay | Admitting: Podiatry

## 2021-01-20 DIAGNOSIS — M7662 Achilles tendinitis, left leg: Secondary | ICD-10-CM

## 2021-01-30 ENCOUNTER — Ambulatory Visit: Payer: BC Managed Care – PPO | Attending: Podiatry | Admitting: Physical Therapy

## 2021-01-30 ENCOUNTER — Other Ambulatory Visit: Payer: Self-pay

## 2021-01-30 ENCOUNTER — Encounter: Payer: Self-pay | Admitting: Physical Therapy

## 2021-01-30 DIAGNOSIS — M25572 Pain in left ankle and joints of left foot: Secondary | ICD-10-CM | POA: Insufficient documentation

## 2021-01-30 DIAGNOSIS — R262 Difficulty in walking, not elsewhere classified: Secondary | ICD-10-CM | POA: Insufficient documentation

## 2021-01-30 DIAGNOSIS — M6281 Muscle weakness (generalized): Secondary | ICD-10-CM | POA: Diagnosis not present

## 2021-01-30 DIAGNOSIS — M25672 Stiffness of left ankle, not elsewhere classified: Secondary | ICD-10-CM | POA: Diagnosis not present

## 2021-01-30 NOTE — Therapy (Signed)
[facility] [address], [place], [place], [address] Phone: [phone]   Fax:  [phone]  Physical Therapy Evaluation  Patient Details  Name: Adam Mcdonald MRN: [id number] Date of Birth: February 05, 1966 Referring Provider (PT): Dr. Milinda Pointer   Encounter Date: 01/30/2021   PT End of Session - 01/30/21 0853     Visit Number 1    Number of Visits 60    Date for PT Re-Evaluation 03/27/21    Authorization Type BCBS 60 visit limit    PT Start Time 0801    PT Stop Time 0846    PT Time Calculation (min) 45 min    Activity Tolerance Patient tolerated treatment well             Past Medical History:  Diagnosis Date   Chicken pox    Chronic pain    Depression    History of MRSA infection    Hypertension    Sleep apnea    Sleep apnea    Ventral hernia without obstruction or gangrene    Vitamin D deficiency     Past Surgical History:  Procedure Laterality Date   ACHILLES TENDON REPAIR     UVULOPALATOPHARYNGOPLASTY      There were no vitals filed for this visit.    Subjective Assessment - 01/30/21 0804     Subjective Achilles surgery 02/16/19.  95% recovered. Then In early June had aching in achilles tendon. Later in June,  Pulling a pallet jack on carpet and felt a stronger ache. Saw orthopedist showed a new bone spur and inflammation.  Had steroid dose pack, steroid injection and 2 week work restrictions.  Symptoms got worse in those 2 weeks b/c lots of walking.  After less walking improved.  Sept 2 scanning for custom orthotic.  Work says if can't do 100% then can't work.  So on disability now.  New diagnoses of pre-DM, HTN, increased stress associated with work.    Pertinent History Surgery 02/16/19.  Cut into gastroc to lengthen.   Removed part of fat pad.  They removed a large portion of bone    Limitations Walking;Standing;Lifting    Diagnostic tests recent assessment showed inflammation and bone spur     Patient Stated Goals get back to previous level    Currently in Pain? No/denies    Pain Score 0-No pain    Pain Location Achilles region medial and lateral    Pain Orientation Left    Pain Type Acute pain    Pain Onset More than a month ago    Pain Frequency Intermittent    Aggravating Factors  walking, down stairs, end of the day    Pain Relieving Factors Meloxicam, Tylenol, off work                [facility] PT Assessment - 01/30/21 0001       Assessment   Medical Diagnosis left achilles tendonitis    Referring Provider (PT) Dr. Milinda Pointer    Next MD Visit to get orthotic    Prior Therapy 2021 post surgical at this clinic      Precautions   Precautions None      Restrictions   Weight Bearing Restrictions No      Balance Screen   Has the patient fallen in the past 6 months No    Has the patient had a decrease in activity level because of a fear of falling?  No    Is the patient reluctant  to leave their home because of a fear of falling?  No      Home Ecologist residence    Living Arrangements Alone      Prior Function   Level of [place] Requirements on short term disability    Leisure walking in the park      Observation/Other Assessments   Focus on Therapeutic Outcomes (FOTO)  59%      Posture/Postural Control   Posture Comments bil pes planus; increased thickening left Achilles; left gastroc atrophy      AROM   Overall AROM Comments good toe mobility    Left Ankle Dorsiflexion 5    Left Ankle Plantar Flexion 40    Left Ankle Inversion 15    Left Ankle Eversion 10      Strength   Overall Strength Comments Able to single leg balance > 10 sec on left with some difficulty; able to do split stance squat with difficulty; unable to do single leg heel raise, can do bil heel raise with difficulty    Left Ankle Dorsiflexion 4/5    Left Ankle Plantar Flexion 3+/5    Left Ankle Inversion 4/5    Left Ankle  Eversion 4/5      Palpation   Palpation comment superior scar flat and smooth, distal scar with some thickening      Ambulation/Gait   Gait Comments dec dorsiflexion and push off on left                        Objective measurements completed on examination: See above findings.       [facility] Adult PT Treatment/Exercise - 01/30/21 0001       Manual Therapy   Soft tissue mobilization instruement assisted to gastroc, achilles, solues, posterior tibialis                      PT Short Term Goals - 01/30/21 [id number]       PT SHORT TERM GOAL #1   Title The patient will demonstrate knowledge of basic self care strategies to promote faster healing and recovery    Time 4    Period Weeks    Status New    Target Date 02/27/21      PT SHORT TERM GOAL #2   Title The patient will be able to walk medium community distances  with pain level 4/10 or less    Time 4    Period Weeks    Status New      PT SHORT TERM GOAL #3   Title The patient will have improved left ankle DF to 8 degrees, PF to 50 degrees, Inversion and eversion to 25 degrees    Time 4    Period Weeks    Status New      PT SHORT TERM GOAL #4   Title FOTO score improved to 68%    Time 4    Period Weeks    Status New               PT Long Term Goals - 01/30/21 [id number]       PT LONG TERM GOAL #1   Title The patient will be independent with safe self progression of HEP    Time 8    Period Weeks    Status New    Target Date 03/27/21      PT LONG TERM  GOAL #2   Title The patient will be able to walk 30 minutes with pain level 3/10 or less    Time 8    Period Weeks    Status New      PT LONG TERM GOAL #3   Title The patient will have improved ankle DF to 10 degrees and PF to 50 degrees needed for greater ease ascending/descending steps at this apartment    Time 8    Period Weeks    Status New      PT LONG TERM GOAL #4   Title The patient will have improved left ankle/foot  plantarflexion strength 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  He presents today stating he had recovered well (about 95% of the way) when he began to have Achilles tendon aching particularly at the end of a workday in early June.  In the later part of June he had an incident at work and had a further increase in discomfort.  He saw the doctor who did the surgery and was treated for inflammation including an injection and steroid dose pack with minimal improvement as he continued to work including lots of walking.  After further work restrictions to limit walking, he has noted some improvement.  Due to work policy, he is unable to be 100% so he is currently on disability.  He has an appointment next week for casting for a custom orthotic.  He is unable to walk community distances comfortably and difficulty descending stairs.   Left gastroc atrophy noted, increased Achilles thickness noted.  Bil pes planus.  Able to single leg stand 10 sec.  Unable to single leg heel raise on left, able to do bil heel raise with difficulty.  Decreased ankle ROM particularly dorsiflexion: 5 degrees, PF 40, inversion 15 degrees, eversion 10 degrees.  Decreased dorsiflexion and push off during gait.  The patient would benefit from PT to address these deficits.    Personal Factors and Comorbidities Comorbidity 1;Comorbidity 2;Comorbidity 3+;Time since onset of injury/illness/exacerbation;Social Background   mother in [place] is ill   Comorbidities work place stress;  new Diabetes and HTN diagnoses    Examination-Activity  Limitations Locomotion Level;Stairs;Lift;Squat    Examination-Participation Restrictions Community Activity;Occupation    Stability/Clinical Decision Making Stable/Uncomplicated    Clinical Decision Making Low    Rehab Potential Good    PT Frequency 2x / week    PT Duration 8 weeks    PT Treatment/Interventions ADLs/Self Care Home Management;Cryotherapy;Electrical Stimulation;Ultrasound;Moist Heat;Iontophoresis '4mg'$ /ml Dexamethasone;Therapeutic activities;Therapeutic exercise;Neuromuscular re-education;Manual techniques;Patient/family education;Dry needling;Taping;Vasopneumatic Device    PT Next Visit Plan Instrument assisted manual therapy for Achilles and scar mobility; gastroc strengthening    Consulted and Agree with Plan of Care Patient             Patient will benefit from skilled therapeutic intervention in order to improve the following deficits and impairments:  Difficulty walking, Decreased range  of motion, Increased fascial restricitons, Pain, Decreased strength, Decreased scar mobility, Increased edema  Visit Diagnosis: Pain in left ankle and joints of left foot - Plan: PT plan of care cert/re-cert  Stiffness of left ankle, not elsewhere classified - Plan: PT plan of care cert/re-cert  Muscle weakness (generalized) - Plan: PT plan of care cert/re-cert  Difficulty in walking, not elsewhere classified - Plan: PT plan of care cert/re-cert     Problem List Patient Active Problem List   Diagnosis Date Noted   Diabetes mellitus type II, controlled ([place]) 12/15/2020   Obstructive sleep apnea 11/24/2020   Hypertension 08/31/2019   Low back pain 06/23/2018   Depression, recurrent ([place]) 06/02/2018   Vitamin D deficiency 06/02/2018   Ruben Im, PT 01/30/21 9:30 AM Phone: [phone] Fax: [phone]  Alvera Singh 01/30/2021, 9:29 AM  [facility] [address] [place], [place], [address] Phone:  [phone]   Fax:  [phone]  Name: Adam Mcdonald MRN: [id number] Date of Birth: Apr 21, 1966

## 2021-02-03 ENCOUNTER — Encounter: Payer: Self-pay | Admitting: Family Medicine

## 2021-02-03 DIAGNOSIS — G4733 Obstructive sleep apnea (adult) (pediatric): Secondary | ICD-10-CM

## 2021-02-05 ENCOUNTER — Ambulatory Visit: Payer: BC Managed Care – PPO | Attending: Podiatry | Admitting: Physical Therapy

## 2021-02-05 ENCOUNTER — Other Ambulatory Visit: Payer: Self-pay

## 2021-02-05 DIAGNOSIS — M25572 Pain in left ankle and joints of left foot: Secondary | ICD-10-CM | POA: Insufficient documentation

## 2021-02-05 DIAGNOSIS — M6281 Muscle weakness (generalized): Secondary | ICD-10-CM | POA: Insufficient documentation

## 2021-02-05 DIAGNOSIS — R262 Difficulty in walking, not elsewhere classified: Secondary | ICD-10-CM | POA: Insufficient documentation

## 2021-02-05 DIAGNOSIS — M25672 Stiffness of left ankle, not elsewhere classified: Secondary | ICD-10-CM | POA: Insufficient documentation

## 2021-02-05 NOTE — Therapy (Signed)
[facility] [address], [place], [place], [address] Phone: [phone]   Fax:  [phone]  Physical Therapy Treatment  Patient Details  Name: Adam Mcdonald MRN: [id number] Date of Birth: 12/05/1965 Referring Provider (PT): Dr. Milinda Pointer   Encounter Date: 02/05/2021   PT End of Session - 02/05/21 1837     Visit Number 2    Number of Visits 60    Date for PT Re-Evaluation 03/27/21    Authorization Type BCBS 60 visit limit    PT Start Time 0755    PT Stop Time 0836    PT Time Calculation (min) 41 min    Activity Tolerance Patient tolerated treatment well             Past Medical History:  Diagnosis Date   Chicken pox    Chronic pain    Depression    History of MRSA infection    Hypertension    Sleep apnea    Sleep apnea    Ventral hernia without obstruction or gangrene    Vitamin D deficiency     Past Surgical History:  Procedure Laterality Date   ACHILLES TENDON REPAIR     UVULOPALATOPHARYNGOPLASTY      There were no vitals filed for this visit.   Subjective Assessment - 02/05/21 0842     Subjective Had some weird sensations and some sharp pain on Achilles near the heel.  Feel like Graston would still be helpful though.    Pertinent History Surgery 02/16/19.  Cut into gastroc to lengthen.   Removed part of fat pad.  They removed a large portion of bone    Currently in Pain? No/denies    Pain Score 0-No pain    Pain Location Ankle    Pain Orientation Left                               [facility] Adult PT Treatment/Exercise - 02/05/21 0001       Self-Care   Self-Care Other Self-Care Comments    Other Self-Care Comments  discussed modulated return to walking      Manual Therapy   Soft tissue mobilization Graston instrument assissted soft tissue, scar massage left gastroc, soleus, Achilles in prone instruments G2, 3, 4      Ankle Exercises: Seated   Towel Crunch 5 reps     Heel Raises 20 reps    Heel Raises Limitations with small ball between heels/ankles    Other Seated Ankle Exercises arch lifts 10x    Other Seated Ankle Exercises red band ankle inversion 15x                    PT Education - 02/05/21 1836     Education Details towel crunches; seated heel raises with ball; red band inversion; seated arch lifts    Person(s) Educated Patient    Methods Explanation;Demonstration;Handout    Comprehension Returned demonstration;Verbalized understanding              PT Short Term Goals - 01/30/21 0921       PT SHORT TERM GOAL #1   Title The patient will demonstrate knowledge of basic self care strategies to promote faster healing and recovery    Time 4    Period Weeks    Status New    Target Date 02/27/21      PT SHORT TERM GOAL #2  Title The patient will be able to walk medium community distances  with pain level 4/10 or less    Time 4    Period Weeks    Status New      PT SHORT TERM GOAL #3   Title The patient will have improved left ankle DF to 8 degrees, PF to 50 degrees, Inversion and eversion to 25 degrees    Time 4    Period Weeks    Status New      PT SHORT TERM GOAL #4   Title FOTO score improved to 68%    Time 4    Period Weeks    Status New               PT Long Term Goals - 01/30/21 [id number]       PT LONG TERM GOAL #1   Title The patient will be independent with safe self progression of HEP    Time 8    Period Weeks    Status New    Target Date 03/27/21      PT LONG TERM GOAL #2   Title The patient will be able to walk 30 minutes with pain level 3/10 or less    Time 8    Period Weeks    Status New      PT LONG TERM GOAL #3   Title The patient will have improved ankle DF to 10 degrees and PF to 50 degrees needed for greater ease ascending/descending steps at this apartment    Time 8    Period Weeks    Status New      PT LONG TERM GOAL #4   Title The patient will have improved left ankle/foot  plantarflexion strength grossly 4/5 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  Good response to Graston instrument assisted mobilization to this area.  Initiated seated intrinsic and ankle strengthening HEP.  No increase in pain reported.  Discussed progressive walking dosage and pros/cons of indoor barefoot walking.  Therapist monitoring response throughout session.    Personal Factors and Comorbidities Comorbidity 1;Comorbidity 2;Comorbidity 3+;Time since onset of injury/illness/exacerbation;Social Background    Rehab Potential Good    PT Frequency 2x / week    PT Duration 8 weeks    PT Treatment/Interventions ADLs/Self Care Home Management;Cryotherapy;Electrical Stimulation;Ultrasound;Moist Heat;Iontophoresis '4mg'$ /ml Dexamethasone;Therapeutic activities;Therapeutic exercise;Neuromuscular re-education;Manual techniques;Patient/family education;Dry needling;Taping;Vasopneumatic Device    PT Next Visit Plan Instrument assisted manual therapy for Achilles and scar mobility; gastroc strengthening             Patient will benefit from skilled therapeutic intervention in order to improve the following deficits and impairments:  Difficulty walking, Decreased range of motion, Increased fascial restricitons, Pain, Decreased strength, Decreased scar mobility, Increased edema  Visit Diagnosis: Pain in left ankle and joints of left foot  Stiffness of left ankle, not elsewhere classified  Muscle weakness (generalized)     Problem List Patient Active Problem List   Diagnosis Date Noted  Diabetes mellitus type II, controlled ([place]) 12/15/2020   Obstructive  sleep apnea 11/24/2020   Hypertension 08/31/2019   Low back pain 06/23/2018   Depression, recurrent ([place]) 06/02/2018   Vitamin D deficiency 06/02/2018   Ruben Im, PT 02/05/21 6:46 PM Phone: [phone] Fax: [phone]  Alvera Singh 02/05/2021, 6:45 PM  [facility] [address] [place], [place], [address] Phone: [phone]   Fax:  [phone]  Name: Christerpher Kishi MRN: [id number] Date of Birth: 16-Oct-1965

## 2021-02-05 NOTE — Patient Instructions (Signed)
Access Code: [email or web address] URL: [email or web address] Date: 02/05/2021 Prepared by: Ruben Im  Exercises Seated Arch Lifts - 1 x daily - 7 x weekly - 1 sets - 10 reps Seated Toe Towel Scrunches - 1 x daily - 7 x weekly - 1 sets - 10 reps Seated Heel Raise - 1 x daily - 7 x weekly - 1 sets - 10 reps Seated Figure 4 Ankle Inversion with Resistance - 1 x daily - 7 x weekly - 1 sets - 10 reps

## 2021-02-06 ENCOUNTER — Ambulatory Visit: Payer: BC Managed Care – PPO | Admitting: Physical Therapy

## 2021-02-06 ENCOUNTER — Other Ambulatory Visit: Payer: BC Managed Care – PPO

## 2021-02-06 DIAGNOSIS — M25672 Stiffness of left ankle, not elsewhere classified: Secondary | ICD-10-CM

## 2021-02-06 DIAGNOSIS — M79671 Pain in right foot: Secondary | ICD-10-CM | POA: Diagnosis not present

## 2021-02-06 DIAGNOSIS — M7662 Achilles tendinitis, left leg: Secondary | ICD-10-CM | POA: Diagnosis not present

## 2021-02-06 DIAGNOSIS — R262 Difficulty in walking, not elsewhere classified: Secondary | ICD-10-CM | POA: Diagnosis not present

## 2021-02-06 DIAGNOSIS — M6281 Muscle weakness (generalized): Secondary | ICD-10-CM

## 2021-02-06 DIAGNOSIS — M25572 Pain in left ankle and joints of left foot: Secondary | ICD-10-CM

## 2021-02-06 NOTE — Therapy (Signed)
[facility] [address] [place], [place], [address] Phone: [phone]   Fax:  [phone]  Physical Therapy Treatment  Patient Details  Name: Adam Mcdonald MRN: [id number] Date of Birth: 17-Feb-1966 Referring Provider (PT): Dr. Milinda Pointer   Encounter Date: 02/06/2021   PT End of Session - 02/06/21 1119     Visit Number 3    Number of Visits 60    Date for PT Re-Evaluation 03/27/21    Authorization Type BCBS 60 visit limit    PT Start Time 0927    PT Stop Time 1010    PT Time Calculation (min) 43 min    Activity Tolerance Patient tolerated treatment well             Past Medical History:  Diagnosis Date   Chicken pox    Chronic pain    Depression    History of MRSA infection    Hypertension    Sleep apnea    Sleep apnea    Ventral hernia without obstruction or gangrene    Vitamin D deficiency     Past Surgical History:  Procedure Laterality Date   ACHILLES TENDON REPAIR     UVULOPALATOPHARYNGOPLASTY      There were no vitals filed for this visit.   Subjective Assessment - 02/06/21 0926     Subjective Steep incline walk coming back was difficult 10 min.  In the afternoon sharp pain lateral foot with a change of direction.    Pertinent History Surgery 02/16/19.  Cut into gastroc to lengthen.   Removed part of fat pad.  They removed a large portion of bone    Diagnostic tests recent assessment showed inflammation and bone spur    Patient Stated Goals get back to previous level    Currently in Pain? Yes    Pain Score 2     Pain Location Ankle    Pain Orientation Left;Lateral                               [facility] Adult PT Treatment/Exercise - 02/06/21 0001       Manual Therapy   Soft tissue mobilization Graston instrument assissted soft tissue, scar massage left gastroc, soleus, Achilles in prone instruments G2, 3, 4      Ankle Exercises: Standing   Other Standing Ankle  Exercises towel pinning with great toe in staggered stance    Other Standing Ankle Exercises WB on left (barefoot) with right 3 ways      Ankle Exercises: Aerobic   Stationary Bike 6 min L1   while discussing progression of strengthening next week (calf press) and equipment to use at the gym                   PT Education - 02/06/21 1119     Education Details towel pinning; standing 3 ways WB on left    Person(s) Educated Patient    Methods Explanation;Demonstration;Handout    Comprehension Returned demonstration;Verbalized understanding              PT Short Term Goals - 01/30/21 0921       PT SHORT TERM GOAL #1   Title The patient will demonstrate knowledge of basic self care strategies to promote faster healing and recovery    Time 4    Period Weeks    Status New    Target Date 02/27/21  PT SHORT TERM GOAL #2   Title The patient will be able to walk medium community distances  with pain level 4/10 or less    Time 4    Period Weeks    Status New      PT SHORT TERM GOAL #3   Title The patient will have improved left ankle DF to 8 degrees, PF to 50 degrees, Inversion and eversion to 25 degrees    Time 4    Period Weeks    Status New      PT SHORT TERM GOAL #4   Title FOTO score improved to 68%    Time 4    Period Weeks    Status New               PT Long Term Goals - 01/30/21 [id number]       PT LONG TERM GOAL #1   Title The patient will be independent with safe self progression of HEP    Time 8    Period Weeks    Status New    Target Date 03/27/21      PT LONG TERM GOAL #2   Title The patient will be able to walk 30 minutes with pain level 3/10 or less    Time 8    Period Weeks    Status New      PT LONG TERM GOAL #3   Title The patient will have improved ankle DF to 10 degrees and PF to 50 degrees needed for greater ease ascending/descending steps at this apartment    Time 8    Period Weeks    Status New      PT LONG TERM GOAL #4    Title The patient will have improved left ankle/foot plantarflexion strength grossly 4/5 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  Minimal tenderness medial Achilles today.  Added great toe flexor strengthening in standing and initiated proprioceptive ex's to HEP.  Good response to the stationary bike vs walking (less pain).  Patient considering rejoining a gym in the near future.  Will progress to partial loaded strengthening next week.    Personal Factors and Comorbidities Comorbidity 1;Comorbidity 2;Comorbidity 3+;Time since onset of injury/illness/exacerbation;Social Background    Comorbidities work place stress;  new Diabetes and HTN diagnoses    Rehab Potential Good    PT Frequency 2x / week    PT Duration 8 weeks    PT Treatment/Interventions ADLs/Self Care Home Management;Cryotherapy;Electrical Stimulation;Ultrasound;Moist Heat;Iontophoresis '4mg'$ /ml Dexamethasone;Therapeutic activities;Therapeutic exercise;Neuromuscular re-education;Manual techniques;Patient/family education;Dry needling;Taping;Vasopneumatic Device    PT Next Visit Plan Instrument assisted manual therapy for Achilles and scar mobility; gastroc strengthening try calf press next week; proprioceptive ex's (try foam)    Consulted and Agree with Plan of Care Patient             Patient will benefit from skilled therapeutic intervention in order to improve the following deficits and impairments:  Difficulty walking, Decreased range of motion, Increased fascial restricitons, Pain, Decreased strength,  Decreased scar mobility, Increased edema  Visit Diagnosis: Pain in left ankle and joints of left  foot  Stiffness of left ankle, not elsewhere classified  Muscle weakness (generalized)     Problem List Patient Active Problem List   Diagnosis Date Noted   Diabetes mellitus type II, controlled ([place]) 12/15/2020   Obstructive sleep apnea 11/24/2020   Hypertension 08/31/2019   Low back pain 06/23/2018   Depression, recurrent ([place]) 06/02/2018   Vitamin D deficiency 06/02/2018   Ruben Im, PT 02/06/21 11:27 AM Phone: [phone] Fax: [phone]   Alvera Singh 02/06/2021, 11:26 AM  [facility] [address], [place], [place], [address] Phone: [phone]   Fax:  [phone]  Name: Adam Mcdonald MRN: [id number] Date of Birth: 08/14/1965

## 2021-02-06 NOTE — Patient Instructions (Signed)
Access Code: [email or web address] URL: [email or web address] Date: 02/06/2021 Prepared by: Ruben Im  Exercises Seated Arch Lifts - 1 x daily - 7 x weekly - 1 sets - 10 reps Seated Toe Towel Scrunches - 1 x daily - 7 x weekly - 1 sets - 10 reps Seated Heel Raise - 1 x daily - 7 x weekly - 1 sets - 10 reps Seated Figure 4 Ankle Inversion with Resistance - 1 x daily - 7 x weekly - 1 sets - 10 reps Staggered Stance Forward Backward Weight Shift with Counter Support (Mirrored) - 1 x daily - 7 x weekly - 1 sets - 10 reps Standing 3-Way Kick - 1 x daily - 7 x weekly - 1 sets - 10 reps

## 2021-02-10 ENCOUNTER — Encounter: Payer: Self-pay | Admitting: Family Medicine

## 2021-02-10 DIAGNOSIS — R748 Abnormal levels of other serum enzymes: Secondary | ICD-10-CM

## 2021-02-10 DIAGNOSIS — E559 Vitamin D deficiency, unspecified: Secondary | ICD-10-CM

## 2021-02-10 DIAGNOSIS — G4733 Obstructive sleep apnea (adult) (pediatric): Secondary | ICD-10-CM

## 2021-02-10 DIAGNOSIS — E1169 Type 2 diabetes mellitus with other specified complication: Secondary | ICD-10-CM

## 2021-02-10 DIAGNOSIS — I10 Essential (primary) hypertension: Secondary | ICD-10-CM

## 2021-02-12 ENCOUNTER — Other Ambulatory Visit: Payer: Self-pay

## 2021-02-12 ENCOUNTER — Ambulatory Visit: Payer: BC Managed Care – PPO | Admitting: Physical Therapy

## 2021-02-12 DIAGNOSIS — M6281 Muscle weakness (generalized): Secondary | ICD-10-CM

## 2021-02-12 DIAGNOSIS — M25672 Stiffness of left ankle, not elsewhere classified: Secondary | ICD-10-CM | POA: Diagnosis not present

## 2021-02-12 DIAGNOSIS — R262 Difficulty in walking, not elsewhere classified: Secondary | ICD-10-CM | POA: Diagnosis not present

## 2021-02-12 DIAGNOSIS — M25572 Pain in left ankle and joints of left foot: Secondary | ICD-10-CM

## 2021-02-12 NOTE — Therapy (Signed)
[facility] [address] [place], [place], [address] Phone: [phone]   Fax:  [phone]  Physical Therapy Treatment  Patient Details  Name: Adam Mcdonald MRN: [id number] Date of Birth: 01/01/1966 Referring Provider (PT): Dr. Milinda Pointer   Encounter Date: 02/12/2021   PT End of Session - 02/12/21 1652     Visit Number 4    Number of Visits 60    Date for PT Re-Evaluation 03/27/21    Authorization Type BCBS 60 visit limit    PT Start Time 1608    PT Stop Time 1650    PT Time Calculation (min) 42 min    Activity Tolerance Patient tolerated treatment well             Past Medical History:  Diagnosis Date   Chicken pox    Chronic pain    Depression    History of MRSA infection    Hypertension    Sleep apnea    Sleep apnea    Ventral hernia without obstruction or gangrene    Vitamin D deficiency     Past Surgical History:  Procedure Laterality Date   ACHILLES TENDON REPAIR     UVULOPALATOPHARYNGOPLASTY      There were no vitals filed for this visit.   Subjective Assessment - 02/12/21 1606     Subjective Discomfort and twinges for 4-5 days after last time.  When I do the ex's some discomfort.  I've been doing some walking in the apartment complex. I'm discouraged.  I've been out of work 1 month.    Pertinent History Surgery 02/16/19.  Cut into gastroc to lengthen.   Removed part of fat pad.  They removed a large portion of bone    Currently in Pain? Yes    Pain Score 2     Pain Location Ankle    Pain Orientation Left                               [facility] Adult PT Treatment/Exercise - 02/12/21 0001       Manual Therapy   Soft tissue mobilization Graston instrument assissted soft tissue, scar massage left gastroc, soleus, Achilles in prone instruments G2, 3, 4      Ankle Exercises: Machines for Strengthening   Cybex Leg Press seat 7 left only 30# 3x10      Ankle Exercises:  Seated   Other Seated Ankle Exercises discussion of walking program, graded exposure, shorter steps                       PT Short Term Goals - 01/30/21 [id number]       PT SHORT TERM GOAL #1   Title The patient will demonstrate knowledge of basic self care strategies to promote faster healing and recovery    Time 4    Period Weeks    Status New    Target Date 02/27/21      PT SHORT TERM GOAL #2   Title The patient will be able to walk medium community distances  with pain level 4/10 or less    Time 4    Period Weeks    Status New      PT SHORT TERM GOAL #3   Title The patient will have improved left ankle DF to 8 degrees, PF to 50 degrees, Inversion and eversion to 25 degrees    Time 4  Period Weeks    Status New      PT SHORT TERM GOAL #4   Title FOTO score improved to 68%    Time 4    Period Weeks    Status New               PT Long Term Goals - 01/30/21 [id number]       PT LONG TERM GOAL #1   Title The patient will be independent with safe self progression of HEP    Time 8    Period Weeks    Status New    Target Date 03/27/21      PT LONG TERM GOAL #2   Title The patient will be able to walk 30 minutes with pain level 3/10 or less    Time 8    Period Weeks    Status New      PT LONG TERM GOAL #3   Title The patient will have improved ankle DF to 10 degrees and PF to 50 degrees needed for greater ease ascending/descending steps at this apartment    Time 8    Period Weeks    Status New      PT LONG TERM GOAL #4   Title The patient will have improved left ankle/foot plantarflexion strength grossly 4/5 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  Improving soft tissue mobility noted and nonpainful with Graston myofasical work. No redness or signs of inflammation.   Initiated plantarflexion strengthening on calf press.  He reports following session that "everything feels good."  Discussed a walking program strategy.    Personal Factors and Comorbidities Comorbidity 1;Comorbidity 2;Comorbidity 3+;Time since onset of injury/illness/exacerbation;Social Background    Comorbidities work place stress;  new Diabetes and HTN diagnoses    Examination-Participation Restrictions Community Activity;Occupation    Rehab Potential Good    PT Frequency 2x / week    PT Duration 8 weeks    PT Treatment/Interventions ADLs/Self Care Home Management;Cryotherapy;Electrical Stimulation;Ultrasound;Moist Heat;Iontophoresis '4mg'$ /ml Dexamethasone;Therapeutic activities;Therapeutic exercise;Neuromuscular re-education;Manual techniques;Patient/family education;Dry needling;Taping;Vasopneumatic Device    PT Next Visit Plan Instrument assisted manual therapy for Achilles and scar mobility; continue calf press; proprioceptive ex's (try foam)             Patient will benefit from skilled therapeutic intervention in order to improve the following deficits and impairments:  Difficulty walking, Decreased range of motion, Increased fascial restricitons, Pain, Decreased strength, Decreased scar mobility, Increased edema  Visit Diagnosis: Pain in left ankle and joints of left foot  Stiffness of left ankle, not elsewhere classified  Muscle weakness (generalized)     Problem List Patient Active Problem List   Diagnosis Date Noted   Diabetes mellitus type II, controlled ([place]) 12/15/2020   Obstructive sleep apnea 11/24/2020   Hypertension 08/31/2019   Low back pain 06/23/2018   Depression, recurrent ([place]) 06/02/2018   Vitamin D deficiency 06/02/2018   Marzetta Board  Jymir Dunaj, PT 02/12/21 4:57 PM Phone: [phone] Fax: [phone]  Alvera Singh, PT 02/12/2021, 4:57 PM  [facility] [address] [place], [place], [address] Phone: [phone]   Fax:  [phone]  Name: Shawnn Womble MRN: [id number] Date of Birth: 1966-04-21

## 2021-02-17 ENCOUNTER — Other Ambulatory Visit: Payer: Self-pay

## 2021-02-17 ENCOUNTER — Ambulatory Visit: Payer: BC Managed Care – PPO | Admitting: Physical Therapy

## 2021-02-17 DIAGNOSIS — M6281 Muscle weakness (generalized): Secondary | ICD-10-CM | POA: Diagnosis not present

## 2021-02-17 DIAGNOSIS — R262 Difficulty in walking, not elsewhere classified: Secondary | ICD-10-CM

## 2021-02-17 DIAGNOSIS — M25572 Pain in left ankle and joints of left foot: Secondary | ICD-10-CM | POA: Diagnosis not present

## 2021-02-17 DIAGNOSIS — M25672 Stiffness of left ankle, not elsewhere classified: Secondary | ICD-10-CM | POA: Diagnosis not present

## 2021-02-17 NOTE — Therapy (Signed)
[facility] [address], [place], [place], [address] Phone: [phone]   Fax:  [phone]  Physical Therapy Treatment  Patient Details  Name: Adam Mcdonald MRN: [id number] Date of Birth: Mar 21, 1966 Referring Provider (PT): Dr. Milinda Pointer   Encounter Date: 02/17/2021   PT End of Session - 02/17/21 1659     Visit Number 5    Number of Visits 60    Date for PT Re-Evaluation 03/27/21    Authorization Type BCBS 60 visit limit    PT Start Time 1142    PT Stop Time 1223    PT Time Calculation (min) 41 min    Activity Tolerance Patient tolerated treatment well             Past Medical History:  Diagnosis Date   Chicken pox    Chronic pain    Depression    History of MRSA infection    Hypertension    Sleep apnea    Sleep apnea    Ventral hernia without obstruction or gangrene    Vitamin D deficiency     Past Surgical History:  Procedure Laterality Date   ACHILLES TENDON REPAIR     UVULOPALATOPHARYNGOPLASTY      There were no vitals filed for this visit.   Subjective Assessment - 02/17/21 1142     Subjective Had a orthotic casting. Will get next week.  Maybe back to work in November.  Felt good after last treatment.  Band toe pushes 30 reps.  Discomfort in Achilles off/on.  No medial/lateral pain.    Pertinent History Surgery 02/16/19.  Cut into gastroc to lengthen.   Removed part of fat pad.  They removed a large portion of bone    Currently in Pain? Yes    Pain Score 2     Pain Location Ankle                               [facility] Adult PT Treatment/Exercise - 02/17/21 0001       Knee/Hip Exercises: Standing   SLS with Vectors curtsey with slider 20x    Other Standing Knee Exercises standing on foam with 4 ways    Other Standing Knee Exercises kickstand position with pallof press 15x      Ankle Exercises: Machines for Strengthening   Cybex Leg Press seat 7 left only 30# 3x10       Ankle Exercises: Stretches   Other Stretch 1/2 kneel rock for dorsiflexion 15x      Ankle Exercises: Standing   Other Standing Ankle Exercises green band pinning with great toe in staggered stance    Other Standing Ankle Exercises green band single leg deadlifts 15x                       PT Short Term Goals - 01/30/21 0921       PT SHORT TERM GOAL #1   Title The patient will demonstrate knowledge of basic self care strategies to promote faster healing and recovery    Time 4    Period Weeks    Status New    Target Date 02/27/21      PT SHORT TERM GOAL #2   Title The patient will be able to walk medium community distances  with pain level 4/10 or less    Time 4    Period Weeks    Status New  PT SHORT TERM GOAL #3   Title The patient will have improved left ankle DF to 8 degrees, PF to 50 degrees, Inversion and eversion to 25 degrees    Time 4    Period Weeks    Status New      PT SHORT TERM GOAL #4   Title FOTO score improved to 68%    Time 4    Period Weeks    Status New               PT Long Term Goals - 01/30/21 [id number]       PT LONG TERM GOAL #1   Title The patient will be independent with safe self progression of HEP    Time 8    Period Weeks    Status New    Target Date 03/27/21      PT LONG TERM GOAL #2   Title The patient will be able to walk 30 minutes with pain level 3/10 or less    Time 8    Period Weeks    Status New      PT LONG TERM GOAL #3   Title The patient will have improved ankle DF to 10 degrees and PF to 50 degrees needed for greater ease ascending/descending steps at this apartment    Time 8    Period Weeks    Status New      PT LONG TERM GOAL #4   Title The patient will have improved left ankle/foot plantarflexion strength 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  He reports quick fatigue in lower leg musculature with 15 reps or less.  Some increased discomfort in Achilles but overall pain level remains low.  Therapist monitoring response and modifying accordingly.    Personal Factors and Comorbidities Comorbidity 1;Comorbidity 2;Comorbidity 3+;Time since onset of injury/illness/exacerbation;Social Background    Comorbidities work place stress;  new Diabetes and HTN diagnoses    Rehab Potential Good    PT Frequency 2x / week    PT Duration 8 weeks    PT Treatment/Interventions ADLs/Self Care Home Management;Cryotherapy;Electrical Stimulation;Ultrasound;Moist Heat;Iontophoresis '4mg'$ /ml Dexamethasone;Therapeutic activities;Therapeutic exercise;Neuromuscular re-education;Manual techniques;Patient/family education;Dry needling;Taping;Vasopneumatic Device    PT Next Visit Plan Instrument assisted manual therapy for Achilles and scar mobility; continue calf press; proprioceptive ex's;  orthotic next week             Patient will benefit from skilled therapeutic intervention in order to improve the following deficits and impairments:  Difficulty walking, Decreased range of motion, Increased fascial restricitons, Pain, Decreased strength, Decreased scar mobility, Increased edema  Visit Diagnosis: Pain in left ankle and joints of left foot  Stiffness of left ankle, not elsewhere classified  Muscle weakness (generalized)  Difficulty in walking, not elsewhere classified     Problem List Patient Active Problem List   Diagnosis Date Noted   Diabetes mellitus type II, controlled ([place]) 12/15/2020   Obstructive sleep  apnea 11/24/2020   Hypertension 08/31/2019   Low back pain 06/23/2018    Depression, recurrent ([place]) 06/02/2018   Vitamin D deficiency 06/02/2018   Ruben Im, PT 02/17/21 5:06 PM Phone: [phone] Fax: [phone]  Alvera Singh, PT 02/17/2021, 5:05 PM  [facility] [address] [place], [place], [address] Phone: [phone]   Fax:  [phone]  Name: Adam Mcdonald MRN: [id number] Date of Birth: February 29, 1966

## 2021-02-19 ENCOUNTER — Ambulatory Visit: Payer: BC Managed Care – PPO | Admitting: Physical Therapy

## 2021-02-19 ENCOUNTER — Other Ambulatory Visit: Payer: Self-pay

## 2021-02-19 DIAGNOSIS — M6281 Muscle weakness (generalized): Secondary | ICD-10-CM | POA: Diagnosis not present

## 2021-02-19 DIAGNOSIS — R262 Difficulty in walking, not elsewhere classified: Secondary | ICD-10-CM

## 2021-02-19 DIAGNOSIS — M25572 Pain in left ankle and joints of left foot: Secondary | ICD-10-CM

## 2021-02-19 DIAGNOSIS — M25672 Stiffness of left ankle, not elsewhere classified: Secondary | ICD-10-CM | POA: Diagnosis not present

## 2021-02-19 NOTE — Therapy (Signed)
[facility] [address], [place], [place], [address] Phone: [phone]   Fax:  [phone]  Physical Therapy Treatment  Patient Details  Name: Adam Mcdonald MRN: [id number] Date of Birth: 04-16-1966 Referring Provider (PT): Dr. Milinda Pointer   Encounter Date: 02/19/2021   PT End of Session - 02/19/21 1046     Visit Number 6    Number of Visits 60    Date for PT Re-Evaluation 03/27/21    Authorization Type BCBS 60 visit limit    PT Start Time 0800    PT Stop Time 0843    PT Time Calculation (min) 43 min    Activity Tolerance Patient tolerated treatment well             Past Medical History:  Diagnosis Date   Chicken pox    Chronic pain    Depression    History of MRSA infection    Hypertension    Sleep apnea    Sleep apnea    Ventral hernia without obstruction or gangrene    Vitamin D deficiency     Past Surgical History:  Procedure Laterality Date   ACHILLES TENDON REPAIR     UVULOPALATOPHARYNGOPLASTY      There were no vitals filed for this visit.   Subjective Assessment - 02/19/21 0758     Subjective Felt good after last time.  Walked 20 min/ 1 mile with felt heel lift and felt really good.  Evening ache in Achilles.  Ache with band toe presses.  Did 2 sets of 3 split squats yesterday.    Pertinent History Surgery 02/16/19.  Cut into gastroc to lengthen.   Removed part of fat pad.  They removed a large portion of bone    Currently in Pain? Yes    Pain Score 1     Pain Location Ankle    Pain Orientation Left                               [facility] Adult PT Treatment/Exercise - 02/19/21 0001       Self-Care   Other Self-Care Comments  discussed walking dosage      Manual Therapy   Soft tissue mobilization Graston instrument assissted soft tissue, scar massage left gastroc, soleus, Achilles in prone instruments G2, 3, 4      Ankle Exercises: Machines for Strengthening   Cybex  Leg Press seat 7 left only 30# 3x10   4th set at 40#     Ankle Exercises: Stretches   Other Stretch 2nd step dynamic forward/back rocking      Ankle Exercises: Standing   SLS on and off foam with UE movements with blue band and ball bouncing    Side Shuffle (Round Trip) light green loop on feet with side step 2 lengths of counter    Other Standing Ankle Exercises forward step onto BOSU and easy push off 10x    Other Standing Ankle Exercises pt demo split squat                       PT Short Term Goals - 01/30/21 [id number]       PT SHORT TERM GOAL #1   Title The patient will demonstrate knowledge of basic self care strategies to promote faster healing and recovery    Time 4    Period Weeks    Status New  Target Date 02/27/21      PT SHORT TERM GOAL #2   Title The patient will be able to walk medium community distances  with pain level 4/10 or less    Time 4    Period Weeks    Status New      PT SHORT TERM GOAL #3   Title The patient will have improved left ankle DF to 8 degrees, PF to 50 degrees, Inversion and eversion to 25 degrees    Time 4    Period Weeks    Status New      PT SHORT TERM GOAL #4   Title FOTO score improved to 68%    Time 4    Period Weeks    Status New               PT Long Term Goals - 01/30/21 [id number]       PT LONG TERM GOAL #1   Title The patient will be independent with safe self progression of HEP    Time 8    Period Weeks    Status New    Target Date 03/27/21      PT LONG TERM GOAL #2   Title The patient will be able to walk 30 minutes with pain level 3/10 or less    Time 8    Period Weeks    Status New      PT LONG TERM GOAL #3   Title The patient will have improved ankle DF to 10 degrees and PF to 50 degrees needed for greater ease ascending/descending steps at this apartment    Time 8    Period Weeks    Status New      PT LONG TERM GOAL #4   Title The patient will have improved left ankle/foot plantarflexion  strength grossly 4/5 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  Good response to Graston myofascial work.   Gentle lengthening of Achilles encouraged to avoid overuse of peroneals.  Difficulty stabilizing on soft surface but single leg standing stability improving.  Able to increase resistance and number of reps on calf press.  Therapist monitoring response throughout treatment session.    Personal Factors and Comorbidities Comorbidity 1;Comorbidity 2;Comorbidity 3+;Time since onset of injury/illness/exacerbation;Social Background    Comorbidities work place stress;  new Diabetes and HTN diagnoses    Examination-Activity Limitations Locomotion Level;Stairs;Lift;Squat    Rehab Potential Good    PT Frequency 2x / week    PT Duration 8 weeks    PT Treatment/Interventions ADLs/Self Care Home Management;Cryotherapy;Electrical Stimulation;Ultrasound;Moist Heat;Iontophoresis '4mg'$ /ml Dexamethasone;Therapeutic activities;Therapeutic exercise;Neuromuscular re-education;Manual techniques;Patient/family education;Dry needling;Taping;Vasopneumatic Device    PT Next Visit Plan Instrument assisted manual therapy for Achilles and scar mobility; continue calf press; proprioceptive ex's;  orthotic next week             Patient will benefit from skilled therapeutic intervention in order to improve the following deficits and impairments:  Difficulty walking, Decreased range of motion, Increased fascial restricitons, Pain,  Decreased strength, Decreased scar mobility, Increased edema  Visit Diagnosis: Pain in left ankle and joints of left foot  Stiffness of left ankle, not elsewhere classified  Muscle  weakness (generalized)  Difficulty in walking, not elsewhere classified     Problem List Patient Active Problem List   Diagnosis Date Noted   Diabetes mellitus type II, controlled ([place]) 12/15/2020   Obstructive sleep apnea 11/24/2020   Hypertension 08/31/2019   Low back pain 06/23/2018   Depression, recurrent ([place]) 06/02/2018   Vitamin D deficiency 06/02/2018   Ruben Im, PT 02/19/21 10:55 AM Phone: [phone] Fax: [phone]   Alvera Singh, PT 02/19/2021, 10:55 AM  [facility] [address], [place], [place], [address] Phone: [phone]   Fax:  [phone]  Name: Adam Mcdonald MRN: [id number] Date of Birth: 05-10-1966

## 2021-02-22 ENCOUNTER — Other Ambulatory Visit: Payer: Self-pay | Admitting: Family Medicine

## 2021-02-23 ENCOUNTER — Other Ambulatory Visit (INDEPENDENT_AMBULATORY_CARE_PROVIDER_SITE_OTHER): Payer: BC Managed Care – PPO

## 2021-02-23 ENCOUNTER — Other Ambulatory Visit: Payer: Self-pay

## 2021-02-23 DIAGNOSIS — E1169 Type 2 diabetes mellitus with other specified complication: Secondary | ICD-10-CM | POA: Diagnosis not present

## 2021-02-23 DIAGNOSIS — E559 Vitamin D deficiency, unspecified: Secondary | ICD-10-CM

## 2021-02-23 DIAGNOSIS — I10 Essential (primary) hypertension: Secondary | ICD-10-CM | POA: Diagnosis not present

## 2021-02-23 LAB — COMPREHENSIVE METABOLIC PANEL
ALT: 76 U/L — ABNORMAL HIGH (ref 0–53)
AST: 43 U/L — ABNORMAL HIGH (ref 0–37)
Albumin: 4.6 g/dL (ref 3.5–5.2)
Alkaline Phosphatase: 61 U/L (ref 39–117)
BUN: 16 mg/dL (ref 6–23)
CO2: 30 mEq/L (ref 19–32)
Calcium: 10 mg/dL (ref 8.4–10.5)
Chloride: 97 mEq/L (ref 96–112)
Creatinine, Ser: 1.39 mg/dL (ref 0.40–1.50)
GFR: 57.97 mL/min — ABNORMAL LOW (ref >60.00)
Glucose, Bld: 85 mg/dL (ref 70–99)
Potassium: 4.1 mEq/L (ref 3.5–5.1)
Sodium: 137 mEq/L (ref 135–145)
Total Bilirubin: 0.8 mg/dL (ref 0.2–1.2)
Total Protein: 7.7 g/dL (ref 6.0–8.3)

## 2021-02-23 LAB — MICROALBUMIN / CREATININE URINE RATIO
Creatinine,U: 66.9 mg/dL
Microalb Creat Ratio: 1 mg/g (ref 0.0–30.0)
Microalb, Ur: <0.7 mg/dL (ref 0.0–1.9)

## 2021-02-23 LAB — CBC WITH DIFFERENTIAL/PLATELET
Basophils Absolute: 0 10*3/uL (ref 0.0–0.1)
Basophils Relative: 0.4 % (ref 0.0–3.0)
Eosinophils Absolute: 0.1 10*3/uL (ref 0.0–0.7)
Eosinophils Relative: 1.1 % (ref 0.0–5.0)
HCT: 50.2 % (ref 39.0–52.0)
Hemoglobin: 17.3 g/dL — ABNORMAL HIGH (ref 13.0–17.0)
Lymphocytes Relative: 23.8 % (ref 12.0–46.0)
Lymphs Abs: 2 10*3/uL (ref 0.7–4.0)
MCHC: 34.5 g/dL (ref 30.0–36.0)
MCV: 89.6 fl (ref 78.0–100.0)
Monocytes Absolute: 0.7 10*3/uL (ref 0.1–1.0)
Monocytes Relative: 8.6 % (ref 3.0–12.0)
Neutro Abs: 5.7 10*3/uL (ref 1.4–7.7)
Neutrophils Relative %: 66.1 % (ref 43.0–77.0)
Platelets: 286 10*3/uL (ref 150.0–400.0)
RBC: 5.61 Mil/uL (ref 4.22–5.81)
RDW: 13.4 % (ref 11.5–15.5)
WBC: 8.6 10*3/uL (ref 4.0–10.5)

## 2021-02-23 LAB — VITAMIN D 25 HYDROXY (VIT D DEFICIENCY, FRACTURES): VITD: 28.21 ng/mL — ABNORMAL LOW (ref 30.00–100.00)

## 2021-02-23 LAB — HEMOGLOBIN A1C: Hgb A1c MFr Bld: 6.2 % (ref 4.6–6.5)

## 2021-02-24 ENCOUNTER — Ambulatory Visit: Payer: BC Managed Care – PPO | Admitting: Physical Therapy

## 2021-02-24 DIAGNOSIS — M6281 Muscle weakness (generalized): Secondary | ICD-10-CM

## 2021-02-24 DIAGNOSIS — R262 Difficulty in walking, not elsewhere classified: Secondary | ICD-10-CM

## 2021-02-24 DIAGNOSIS — M25572 Pain in left ankle and joints of left foot: Secondary | ICD-10-CM | POA: Diagnosis not present

## 2021-02-24 DIAGNOSIS — M25672 Stiffness of left ankle, not elsewhere classified: Secondary | ICD-10-CM

## 2021-02-24 NOTE — Therapy (Signed)
[facility] [address] [place], [place], [address] Phone: [phone]   Fax:  [phone]  Physical Therapy Treatment  Patient Details  Name: Adam Mcdonald MRN: [id number] Date of Birth: [birth year] Referring Provider (PT): Dr. Milinda Pointer   Encounter Date: 02/24/2021   PT End of Session - 02/24/21 0839     Visit Number 7    Number of Visits 60    Date for PT Re-Evaluation 03/27/21    Authorization Type BCBS 60 visit limit    PT Start Time 0755    PT Stop Time 0840    PT Time Calculation (min) 45 min    Activity Tolerance Patient tolerated treatment well             Past Medical History:  Diagnosis Date   Chicken pox    Chronic pain    Depression    History of MRSA infection    Hypertension    Sleep apnea    Sleep apnea    Ventral hernia without obstruction or gangrene    Vitamin D deficiency     Past Surgical History:  Procedure Laterality Date   ACHILLES TENDON REPAIR     UVULOPALATOPHARYNGOPLASTY      There were no vitals filed for this visit.   Subjective Assessment - 02/24/21 0757     Subjective Did 1.2 miles walk, sore after.  2 sets of 5 split squats but stretched Achilles too much but able to do 30 band calf presses later.  Did fine after last session.    Pertinent History Surgery 02/16/19.  Cut into gastroc to lengthen.   Removed part of fat pad.  They removed a large portion of bone    Diagnostic tests recent assessment showed inflammation and bone spur    Currently in Pain? Yes    Pain Score 1     Pain Location Ankle    Pain Orientation Left                               [facility] Adult PT Treatment/Exercise - 02/24/21 0001       Ankle Exercises: Machines for Strengthening   Cybex Leg Press seat 7 left only 35# x10 40# 10x 5x   4th set at 40#     Ankle Exercises: Standing   SLS step on and off BOSU forward and lateral 10x    Rebounder 3 way weight shift and  kickstand with UE movements    Heel Walk (Round Trip) 30# resisted walk forward and back 10x each    Other Standing Ankle Exercises 4 way right, WB on left      Ankle Exercises: Aerobic   Stationary Bike L3 5 min                       PT Short Term Goals - 02/24/21 1424       PT SHORT TERM GOAL #1   Title The patient will demonstrate knowledge of basic self care strategies to promote faster healing and recovery    Status Achieved      PT SHORT TERM GOAL #2   Title The patient will be able to walk medium community distances  with pain level 4/10 or less    Status Achieved      PT SHORT TERM GOAL #3   Title The patient will have improved left ankle DF to 8 degrees,  PF to 50 degrees, Inversion and eversion to 25 degrees    Time 4    Period Weeks    Status On-going      PT SHORT TERM GOAL #4   Title FOTO score improved to 68%    Time 4    Period Weeks    Status On-going               PT Long Term Goals - 01/30/21 2683       PT LONG TERM GOAL #1   Title The patient will be independent with safe self progression of HEP    Time 8    Period Weeks    Status New    Target Date 03/27/21      PT LONG TERM GOAL #2   Title The patient will be able to walk 30 minutes with pain level 3/10 or less    Time 8    Period Weeks    Status New      PT LONG TERM GOAL #3   Title The patient will have improved ankle DF to 10 degrees and PF to 50 degrees needed for greater ease ascending/descending steps at this apartment    Time 8    Period Weeks    Status New      PT LONG TERM GOAL #4   Title The patient will have improved left ankle/foot plantarflexion strength 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  Able to increase load on calf press today as well.  He reports next session will be his last secondary to end of his employment and insurance benefits ending.  Therapist monitoring response throughout treatment session.    Personal Factors and Comorbidities Comorbidity 1;Comorbidity 2;Comorbidity 3+;Time since onset of injury/illness/exacerbation;Social Background    Comorbidities work place stress;  new Diabetes and HTN diagnoses    Rehab Potential Good    PT Frequency 2x / week    PT Duration 8 weeks    PT Treatment/Interventions ADLs/Self Care Home Management;Cryotherapy;Electrical Stimulation;Ultrasound;Moist Heat;Iontophoresis 4mg /ml Dexamethasone;Therapeutic activities;Therapeutic exercise;Neuromuscular re-education;Manual techniques;Patient/family education;Dry needling;Taping;Vasopneumatic Device    PT Next Visit Plan Discharge next visit; recheck ROM and FOTO;  Instrument assisted manual therapy for Achilles and scar mobility; continue calf press; proprioceptive ex's;  orthotic next week             Patient will benefit from skilled therapeutic intervention in order to improve the following deficits and impairments:  Difficulty walking, Decreased range of motion, Increased fascial restricitons, Pain, Decreased strength, Decreased scar mobility, Increased edema  Visit Diagnosis: Pain in left ankle and joints of left foot  Stiffness of left ankle, not elsewhere classified  Muscle weakness (generalized)  Difficulty in walking, not elsewhere classified     Problem List Patient Active Problem List   Diagnosis Date Noted   Diabetes mellitus type II, controlled ([place]) 12/15/2020  Obstructive sleep apnea 11/24/2020   Hypertension 08/31/2019   Low back pain 06/23/2018   Depression,  recurrent ([place]) 06/02/2018   Vitamin D deficiency 06/02/2018   Ruben Im, PT 02/24/21 2:25 PM Phone: [phone] Fax: [phone]  Alvera Singh, PT 02/24/2021, 2:24 PM  [facility] [address] [place], [place], [address] Phone: [phone]   Fax:  [phone]  Name: Adam Mcdonald MRN: [id number] Date of Birth: 1965/12/10

## 2021-02-26 ENCOUNTER — Other Ambulatory Visit: Payer: Self-pay

## 2021-02-26 ENCOUNTER — Ambulatory Visit: Payer: BC Managed Care – PPO | Admitting: Physical Therapy

## 2021-02-26 DIAGNOSIS — M25672 Stiffness of left ankle, not elsewhere classified: Secondary | ICD-10-CM

## 2021-02-26 DIAGNOSIS — M6281 Muscle weakness (generalized): Secondary | ICD-10-CM

## 2021-02-26 DIAGNOSIS — M25572 Pain in left ankle and joints of left foot: Secondary | ICD-10-CM

## 2021-02-26 DIAGNOSIS — R262 Difficulty in walking, not elsewhere classified: Secondary | ICD-10-CM | POA: Diagnosis not present

## 2021-02-26 NOTE — Therapy (Signed)
[facility] [address], [place], [place], [address] Phone: [phone]   Fax:  [phone]  Physical Therapy Treatment/Discharge Summary   Patient Details  Name: Adam Mcdonald MRN: [id number] Date of Birth: 09/15/1965 Referring Provider (PT): Dr. Milinda Pointer   Encounter Date: 02/26/2021   PT End of Session - 02/26/21 0931     Visit Number 8    Number of Visits 60    Date for PT Re-Evaluation 03/27/21    Authorization Type BCBS 60 visit limit    PT Start Time 0838    PT Stop Time 0925    PT Time Calculation (min) 47 min    Activity Tolerance Patient tolerated treatment well             Past Medical History:  Diagnosis Date   Chicken pox    Chronic pain    Depression    History of MRSA infection    Hypertension    Sleep apnea    Sleep apnea    Ventral hernia without obstruction or gangrene    Vitamin D deficiency     Past Surgical History:  Procedure Laterality Date   ACHILLES TENDON REPAIR     UVULOPALATOPHARYNGOPLASTY      There were no vitals filed for this visit.   Subjective Assessment - 02/26/21 0838     Subjective Walked 1.2 miles yesterday and 30 toe presses.  Later had a mistep and rotated wrong and felt it in Achilles mostly medial side.    Pertinent History Surgery 02/16/19.  Cut into gastroc to lengthen.   Removed part of fat pad.  They removed a large portion of bone    Diagnostic tests recent assessment showed inflammation and bone spur    Patient Stated Goals get back to previous level    Currently in Pain? Yes    Pain Score 1     Pain Location Ankle    Pain Orientation Left                [facility] PT Assessment - 02/26/21 0001       Observation/Other Assessments   Focus on Therapeutic Outcomes (FOTO)  63%      AROM   Left Ankle Dorsiflexion 7    Left Ankle Plantar Flexion 57    Left Ankle Inversion 28    Left Ankle Eversion 14      Strength   Left Ankle Dorsiflexion  4+/5    Left Ankle Plantar Flexion 4-/5    Left Ankle Inversion 4+/5    Left Ankle Eversion 4+/5                           [facility] Adult PT Treatment/Exercise - 02/26/21 0001       Ankle Exercises: Machines for Strengthening   Cybex Leg Press seat 7 left only 35# x10 40# 10x 45# x10   4th set at 40#     Ankle Exercises: Stretches   Other Stretch foot on chair rocking for dorsiflexion      Ankle Exercises: Seated   Other Seated Ankle Exercises heel raises with weight on knee   discussed progression to heel hanging off step     Ankle Exercises: Standing   Other Standing Ankle Exercises right foot prop on wall with single leg dead lifts holding 8# 10x    Other Standing Ankle Exercises kickstand with red band stir the pots and other variations  PT Education - 02/26/21 0930     Education Details HEP progression for mobility, gastroc strengthening; proprioception    Person(s) Educated Patient    Methods Explanation;Demonstration;Handout    Comprehension Returned demonstration;Verbalized understanding              PT Short Term Goals - 02/26/21 0937       PT SHORT TERM GOAL #1   Title The patient will demonstrate knowledge of basic self care strategies to promote faster healing and recovery    Status Achieved      PT SHORT TERM GOAL #2   Title The patient will be able to walk medium community distances  with pain level 4/10 or less    Status Achieved      PT SHORT TERM GOAL #3   Title The patient will have improved left ankle DF to 8 degrees, PF to 50 degrees, Inversion and eversion to 25 degrees    Status Partially Met      PT SHORT TERM GOAL #4   Title FOTO score improved to 68%    Status Partially Met               PT Long Term Goals - 02/26/21 0937       PT LONG TERM GOAL #1   Title The patient will be independent with safe self progression of HEP    Status Achieved      PT LONG TERM GOAL #2   Title The  patient will be able to walk 30 minutes with pain level 3/10 or less    Status Achieved      PT LONG TERM GOAL #3   Title The patient will have improved ankle DF to 10 degrees and PF to 50 degrees needed for greater ease ascending/descending steps at this apartment    Status Partially Met      PT LONG TERM GOAL #4   Title The patient will have improved left ankle/foot plantarflexion strength 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  Mild deficits with dorsiflexion ROM and gastroc strength.  Discussed a progressive HEP to address these issues as well as proprioception.  Functionally, he is able to walk > 30 minutes without excacerbation of symptoms.  FOTO score has improved to 63%.  He has partially met goals.  He requests discharge from PT at this time for financial reasons secondary to insurance coverage ending.              Patient will benefit from skilled therapeutic intervention in order to improve the following deficits and impairments:     Visit Diagnosis: Pain in left ankle and joints of left foot  Stiffness of left ankle, not elsewhere classified  Muscle weakness (generalized)  Difficulty in walking, not elsewhere classified   PHYSICAL THERAPY DISCHARGE SUMMARY  Visits from Start of Care: 8  Current functional level related to goals / functional outcomes: See clinical impressions above   Remaining deficits: As above   Education / Equipment: HEP  Patient agrees to discharge. Patient goals were partially met. Patient is being discharged due to financial reasons.   Problem List Patient Active Problem List    Diagnosis Date Noted   Diabetes mellitus type II, controlled ([place]) 12/15/2020   Obstructive sleep apnea 11/24/2020   Hypertension 08/31/2019   Low back pain 06/23/2018   Depression, recurrent ([place]) 06/02/2018   Vitamin D deficiency 06/02/2018   Ruben Im, PT 02/26/21 10:09 AM Phone: [phone] Fax: [phone]  Alvera Singh, PT 02/26/2021, 10:06 AM  [facility] [address], [place], [place], [address] Phone: [phone]   Fax:  [phone]  Name: Mickel Schreur MRN: [id number] Date of Birth: 01/24/1966

## 2021-02-26 NOTE — Patient Instructions (Signed)
Access Code: [email or web address] URL: [email or web address] Date: 02/26/2021 Prepared by: Ruben Im  Exercises Seated Arch Lifts - 1 x daily - 7 x weekly - 1 sets - 10 reps Seated Toe Towel Scrunches - 1 x daily - 7 x weekly - 1 sets - 10 reps Seated Figure 4 Ankle Inversion with Resistance - 1 x daily - 7 x weekly - 1 sets - 10 reps Staggered Stance Forward Backward Weight Shift with Counter Support (Mirrored) - 1 x daily - 7 x weekly - 1 sets - 10 reps Standing 3-Way Kick - 1 x daily - 7 x weekly - 1 sets - 10 reps Seated Eccentric Ankle Plantar Flexion with Resistance - Straight Leg - 1 x daily - 7 x weekly - 3 sets - 10 reps Seated Heel Raise - 1 x daily - 7 x weekly - 3 sets - 10 reps Standing Ankle Dorsiflexion Stretch on Chair - 1 x daily - 7 x weekly - 1 sets - 10 reps Modified Single Leg Balance with Kickstand (Mirrored) - 1 x daily - 7 x weekly - 1 sets - 10 reps Single Leg Deadlift with Kettlebell - 1 x daily - 7 x weekly - 3 sets - 10 reps

## 2021-02-27 ENCOUNTER — Ambulatory Visit (INDEPENDENT_AMBULATORY_CARE_PROVIDER_SITE_OTHER): Payer: BC Managed Care – PPO | Admitting: *Deleted

## 2021-02-27 DIAGNOSIS — M7662 Achilles tendinitis, left leg: Secondary | ICD-10-CM

## 2021-02-27 NOTE — Progress Notes (Signed)
Patient presents today to pick up custom molded foot orthotics, diagnosed with achilles tendonitis by Dr. Milinda Pointer.   Orthotics were dispensed and fit was satisfactory. Reviewed instructions for break-in and wear. Written instructions given to patient.  Patient will follow up as needed.   [facility] orthotics

## 2021-03-02 ENCOUNTER — Encounter: Payer: Self-pay | Admitting: Family Medicine

## 2021-03-02 ENCOUNTER — Other Ambulatory Visit: Payer: Self-pay

## 2021-03-02 ENCOUNTER — Ambulatory Visit (INDEPENDENT_AMBULATORY_CARE_PROVIDER_SITE_OTHER): Payer: BC Managed Care – PPO | Admitting: Family Medicine

## 2021-03-02 VITALS — BP 124/80 | HR 97 | Temp 98.3°F | Ht 67.0 in | Wt 253.8 lb

## 2021-03-02 DIAGNOSIS — Z23 Encounter for immunization: Secondary | ICD-10-CM | POA: Diagnosis not present

## 2021-03-02 DIAGNOSIS — G4733 Obstructive sleep apnea (adult) (pediatric): Secondary | ICD-10-CM

## 2021-03-02 DIAGNOSIS — E1169 Type 2 diabetes mellitus with other specified complication: Secondary | ICD-10-CM | POA: Diagnosis not present

## 2021-03-02 DIAGNOSIS — F339 Major depressive disorder, recurrent, unspecified: Secondary | ICD-10-CM

## 2021-03-02 DIAGNOSIS — I10 Essential (primary) hypertension: Secondary | ICD-10-CM | POA: Diagnosis not present

## 2021-03-02 DIAGNOSIS — E559 Vitamin D deficiency, unspecified: Secondary | ICD-10-CM

## 2021-03-02 MED ORDER — DESVENLAFAXINE SUCCINATE ER 50 MG PO TB24: 50.0000 mg | ORAL_TABLET | Freq: Every day | ORAL | 1 refills | Status: DC

## 2021-03-02 NOTE — Addendum Note (Signed)
Addended by: Agnes Lawrence on: 03/02/2021 09:33 AM   Modules accepted: Orders

## 2021-03-02 NOTE — Progress Notes (Signed)
Adam Mcdonald DOB: [birth year] Encounter date: 03/02/2021  This is a 55 y.o. male who presents with Chief Complaint  Patient presents with   Follow-up     History of present illness: Last visit with me was 11/24/20.  Work stress at that time had been significant.  He was not eating or drinking as well, but was motivated to make some changes after last visit.  He restarted vitamin D after seeing that levels were low on bloodwork.   In June he re-injured surgically repaired achilles. Put him on limited duty but just not healing well. 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 Company told him they didn't have to honor med restrictions and so he was let go from job. Timing was bad - he wrote formal complaint at work right before this letter and words were used against him (he stated he needed to leave before he did something he regretted which was speaking Camera operator, but they turned that into threat of physical action).   He joined gym last week. Noted he wasn't loosing weight.   He has orthotic in shoe now and per surgeon everything is healing well. Does still bother him (like this morning doing light leg press); bothers him if walking more than 20 minutes.   Hyperlipidemia: Crestor 10mg  daily Sleep apnea: He has been compliant with mask, and would like another test in the future, but wanted to lose weight first.  Hypertension: taking losartan-hctz 100-25mg  daily. Stopped checking at home because cuff was always differ than his in office reads.  Type 2 diabetes: Rybelsus samples were given at last visit.  Depression: Wellbutrin 300 mg, Cymbalta 60 mg.  He messaged me in July that Cymbalta was causing significant sexual dysfunction.  This has been an issue for him with all SSRIs in the past.  Per patient: Paxil, Prozac, Zoloft, dry mouth, sweating, sexual dysfunction Doxepin (mostly for sleep) dry mouth; Risperadal - (mostly for  paranoia), do not remember side effects; Trazodone (mostly for sleep) remember icky side effects, but nothing specific (perhaps severe dry mouth); Remeron - Seemed effective, but took several weeks to overcome severe lethargy in mornings. Large weight gain. << I am at my heaviest bodweight ever, so not sure if weight gain is an issue.   Has been waking at 3-4am and forcing self to stay in bed until 5-5:30. Feels very tired in early afternoon. Takes little rest on couch for an hour in afternoon.    Allergies  Allergen Reactions   Other     Thimerosal (eyes only)   Trazodone And Nefazodone     Dry mouth   Current Meds  Medication Sig   Acetaminophen (TYLENOL PO) Take by mouth as needed.   buPROPion (WELLBUTRIN XL) 300 MG 24 hr tablet TAKE ONE TABLET BY MOUTH DAILY   desvenlafaxine (PRISTIQ) 50 MG 24 hr tablet Take 1 tablet (50 mg total) by mouth daily.   Ibuprofen (ADVIL PO) Take by mouth as needed.   losartan-hydrochlorothiazide (HYZAAR) 100-25 MG tablet TAKE ONE TABLET BY MOUTH DAILY   meloxicam (MOBIC) 15 MG tablet TAKE ONE TABLET BY MOUTH DAILY   Multiple Vitamin (MULTI-VITAMIN DAILY PO) Take by mouth.   Omega 3 1200 MG CAPS Take by mouth.   rosuvastatin (CRESTOR) 10 MG tablet Take 1 tablet (10 mg total) by mouth daily.   Semaglutide,0.25 or 0.5MG /DOS, (OZEMPIC, 0.25 OR 0.5 MG/DOSE,) 2 MG/1.5ML SOPN Start with 0.25mg  [place] weekly x 4  weeks, then increase to 0.5mg  [place] weekly   VITAMIN D PO Take 5,000 Units by mouth daily.    Review of Systems  Constitutional:  Positive for fatigue. Negative for chills and fever.  Respiratory:  Negative for cough, chest tightness, shortness of breath and wheezing.   Cardiovascular:  Negative for chest pain, palpitations and leg swelling.  Psychiatric/Behavioral:  Positive for sleep disturbance.        Mood is depressed; he is tearful.   Objective:  BP 138/82 (BP Location: Left Arm, Patient Position: Sitting, Cuff Size: Large)   Pulse 97   Temp 98.3  F (36.8 C) (Oral)   Ht 5\' 7"  (1.702 m)   Wt 253 lb 12.8 oz (115.1 kg)   SpO2 95%   BMI 39.75 kg/m   Weight: 253 lb 12.8 oz (115.1 kg)   BP Readings from Last 3 Encounters:  03/02/21 138/82  12/22/20 128/80  11/24/20 120/80   Wt Readings from Last 3 Encounters:  03/02/21 253 lb 12.8 oz (115.1 kg)  12/22/20 257 lb (116.6 kg)  11/24/20 256 lb (116.1 kg)    Physical Exam Constitutional:      General: He is not in acute distress.    Appearance: He is well-developed.  Cardiovascular:     Rate and Rhythm: Normal rate and regular rhythm.     Heart sounds: Normal heart sounds. No murmur heard.   No friction rub.  Pulmonary:     Effort: Pulmonary effort is normal. No respiratory distress.     Breath sounds: Normal breath sounds. No wheezing or rales.  Musculoskeletal:     Right lower leg: No edema.     Left lower leg: No edema.  Neurological:     Mental Status: He is alert and oriented to person, place, and time.  Psychiatric:        Attention and Perception: Attention normal.        Mood and Affect: Mood is depressed. Affect is tearful.        Behavior: Behavior normal.    Assessment/Plan  1. Primary hypertension Recheck in office 124/80. Continue current medication.   2. Obstructive sleep apnea Would like sleep re-eval; but now will be losing insurance. Will give financial assistance options.   3. Controlled type 2 diabetes mellitus with other specified complication, without long-term current use of insulin (HCC) Well controlled on ozempic. Continue with this.   4. Depression, recurrent ([place]) Adding pristiq 50mg  daily to wellbutrin. Not interested in therapy right now.   5. Vitamin D deficiency Has restarted supplement.   Return in about 2 weeks (around 03/16/2021) for Chronic condition visit.   49 minutes spent with patient in discussion of mood/med options   Micheline Rough, MD

## 2021-03-12 ENCOUNTER — Encounter: Payer: BC Managed Care – PPO | Admitting: Physical Therapy

## 2021-03-17 ENCOUNTER — Encounter: Payer: BC Managed Care – PPO | Admitting: Physical Therapy

## 2021-03-23 ENCOUNTER — Encounter: Payer: Self-pay | Admitting: Family Medicine

## 2021-03-23 ENCOUNTER — Ambulatory Visit (INDEPENDENT_AMBULATORY_CARE_PROVIDER_SITE_OTHER): Payer: Self-pay | Admitting: Family Medicine

## 2021-03-23 ENCOUNTER — Other Ambulatory Visit: Payer: Self-pay

## 2021-03-23 VITALS — BP 128/80 | HR 76 | Temp 98.3°F | Ht 67.0 in | Wt 252.7 lb

## 2021-03-23 DIAGNOSIS — F339 Major depressive disorder, recurrent, unspecified: Secondary | ICD-10-CM

## 2021-03-23 NOTE — Progress Notes (Signed)
Adam Mcdonald DOB: 1965-07-19 Encounter date: 03/23/2021  This is a 55 y.o. male who presents with Chief Complaint  Patient presents with   Follow-up     History of present illness: Last visit with me was 03/02/2021.  We started Pristiq 50 mg daily at that time in addition to his regular Wellbutrin.  First week and a half was really bad, but did get response for a job he applied for. Has been really good for last 1.5 weeks. Felt like emotions were dulled in first week; then started taking him longer to fall asleep; switched med to evening (no help); went back to day time. Not feeling fuzzy. Hard to fall asleep but staying asleep most of the night.   Went to gym yesterday and did small work out.   Got job offer this morning and accepted. 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 He is really excited. Insurance starts in December. Starts November 7th.   Didn't get [employer] - was $900/month.   Left achilles - walking without limping, but still some tightness/aching. Has follow up with surgeon on Tuesday. Just hyper - aware of what is going on.    Allergies  Allergen Reactions   Other     Thimerosal (eyes only)   Trazodone And Nefazodone     Dry mouth   Current Meds  Medication Sig   Acetaminophen (TYLENOL PO) Take by mouth as needed.   buPROPion (WELLBUTRIN XL) 300 MG 24 hr tablet TAKE ONE TABLET BY MOUTH DAILY   desvenlafaxine (PRISTIQ) 50 MG 24 hr tablet Take 1 tablet (50 mg total) by mouth daily.   Ibuprofen (ADVIL PO) Take by mouth as needed.   losartan-hydrochlorothiazide (HYZAAR) 100-25 MG tablet TAKE ONE TABLET BY MOUTH DAILY   meloxicam (MOBIC) 15 MG tablet TAKE ONE TABLET BY MOUTH DAILY   Multiple Vitamin (MULTI-VITAMIN DAILY PO) Take by mouth.   Omega 3 1200 MG CAPS Take by mouth.   rosuvastatin (CRESTOR) 10 MG tablet Take 1 tablet (10 mg total) by mouth daily.   Semaglutide,0.25 or 0.5MG /DOS, (OZEMPIC, 0.25 OR 0.5 MG/DOSE,) 2 MG/1.5ML SOPN Start with 0.25mg  [place]  weekly x 4 weeks, then increase to 0.5mg  [place] weekly   VITAMIN D PO Take 5,000 Units by mouth daily.    Review of Systems  Constitutional:  Negative for chills, fatigue and fever.  Respiratory:  Negative for cough, chest tightness, shortness of breath and wheezing.   Cardiovascular:  Negative for chest pain, palpitations and leg swelling.  Psychiatric/Behavioral:         See hpi; really mood has turned in last week.    Objective:  BP 128/80 (BP Location: Left Arm, Patient Position: Sitting, Cuff Size: Large)   Pulse 76   Temp 98.3 F (36.8 C) (Oral)   Ht 5\' 7"  (1.702 m)   Wt 252 lb 11.2 oz (114.6 kg)   BMI 39.58 kg/m   Weight: 252 lb 11.2 oz (114.6 kg)   BP Readings from Last 3 Encounters:  03/23/21 128/80  03/02/21 124/80  12/22/20 128/80   Wt Readings from Last 3 Encounters:  03/23/21 252 lb 11.2 oz (114.6 kg)  03/02/21 253 lb 12.8 oz (115.1 kg)  12/22/20 257 lb (116.6 kg)    Physical Exam Constitutional:      General: He is not in acute distress.    Appearance: He is well-developed.  Cardiovascular:     Rate and Rhythm: Normal rate and regular rhythm.     Heart sounds: Normal  heart sounds. No murmur heard.   No friction rub.  Pulmonary:     Effort: Pulmonary effort is normal. No respiratory distress.     Breath sounds: Normal breath sounds. No wheezing or rales.  Musculoskeletal:     Right lower leg: No edema.     Left lower leg: No edema.  Neurological:     Mental Status: He is alert and oriented to person, place, and time.  Psychiatric:        Attention and Perception: Attention normal.        Mood and Affect: Mood normal.        Speech: Speech normal.        Behavior: Behavior normal.        Cognition and Memory: Cognition normal.     Comments: Mood is much better today.     Assessment/Plan  1. Depression, recurrent ([place]) Mood is doing much better today. Continue with pristiq, wellbutrin.   Will be getting some bloodwork evaluation through employee  health with start of new job.   Return in about 3 months (around 06/23/2021) for Chronic condition visit.     Micheline Rough, MD

## 2021-03-24 ENCOUNTER — Ambulatory Visit: Payer: BC Managed Care – PPO | Admitting: Physical Therapy

## 2021-03-25 ENCOUNTER — Encounter: Payer: Self-pay | Admitting: Family Medicine

## 2021-03-31 ENCOUNTER — Encounter: Payer: Self-pay | Admitting: Podiatry

## 2021-03-31 ENCOUNTER — Other Ambulatory Visit: Payer: Self-pay

## 2021-03-31 ENCOUNTER — Ambulatory Visit (INDEPENDENT_AMBULATORY_CARE_PROVIDER_SITE_OTHER): Payer: Self-pay | Admitting: Podiatry

## 2021-03-31 DIAGNOSIS — M7662 Achilles tendinitis, left leg: Secondary | ICD-10-CM

## 2021-03-31 NOTE — Progress Notes (Signed)
He presents today for follow-up of his Achilles tendon surgery.  States that is doing better and today orthotics really seem to help he states it does not get as achy as it used to but is still tired by the end of the day.  States that he is no longer working at his previous job but is now working for [employer] at [employer].  Objective: Vital signs are stable alert oriented x3 no reproducible pain on palpation of the Achilles tendon left.  Full strength on plantarflexion against resistance dorsiflexion.  Assessment: Well-healing surgical foot.  Plan: Follow-up with me on an as-needed basis no restrictions.

## 2021-05-06 ENCOUNTER — Other Ambulatory Visit (HOSPITAL_COMMUNITY): Payer: Self-pay

## 2021-05-07 ENCOUNTER — Encounter: Payer: Self-pay | Admitting: Family Medicine

## 2021-05-07 DIAGNOSIS — M25572 Pain in left ankle and joints of left foot: Secondary | ICD-10-CM

## 2021-05-07 DIAGNOSIS — F321 Major depressive disorder, single episode, moderate: Secondary | ICD-10-CM

## 2021-05-07 DIAGNOSIS — G8929 Other chronic pain: Secondary | ICD-10-CM

## 2021-05-08 ENCOUNTER — Encounter: Payer: Self-pay | Admitting: Cardiology

## 2021-05-08 ENCOUNTER — Other Ambulatory Visit (HOSPITAL_COMMUNITY): Payer: Self-pay

## 2021-05-08 MED ORDER — OZEMPIC (0.25 OR 0.5 MG/DOSE) 2 MG/1.5ML ~~LOC~~ SOPN
PEN_INJECTOR | SUBCUTANEOUS | 1 refills | Status: DC
  Filled 2021-05-08: qty 1.5, 42d supply, fill #0

## 2021-05-08 MED ORDER — MELOXICAM 15 MG PO TABS
15.0000 mg | ORAL_TABLET | Freq: Every day | ORAL | 1 refills | Status: DC
  Filled 2021-05-08: qty 90, 90d supply, fill #0

## 2021-05-08 MED ORDER — ROSUVASTATIN CALCIUM 10 MG PO TABS
10.0000 mg | ORAL_TABLET | Freq: Every day | ORAL | 2 refills | Status: DC
  Filled 2021-05-08: qty 90, 90d supply, fill #0
  Filled 2021-08-15: qty 90, 90d supply, fill #1
  Filled 2021-11-11: qty 90, 90d supply, fill #2

## 2021-05-08 MED ORDER — LOSARTAN POTASSIUM-HCTZ 100-25 MG PO TABS
1.0000 | ORAL_TABLET | Freq: Every day | ORAL | 1 refills | Status: DC
  Filled 2021-05-08: qty 90, 90d supply, fill #0
  Filled 2021-08-15: qty 90, 90d supply, fill #1

## 2021-05-08 MED ORDER — DESVENLAFAXINE SUCCINATE ER 50 MG PO TB24
50.0000 mg | ORAL_TABLET | Freq: Every day | ORAL | 1 refills | Status: DC
  Filled 2021-05-08: qty 90, 90d supply, fill #0
  Filled 2021-08-15: qty 90, 90d supply, fill #1

## 2021-05-08 MED ORDER — BUPROPION HCL ER (XL) 300 MG PO TB24
300.0000 mg | ORAL_TABLET | Freq: Every day | ORAL | 1 refills | Status: DC
  Filled 2021-05-08: qty 90, 90d supply, fill #0
  Filled 2021-08-15: qty 90, 90d supply, fill #1

## 2021-05-08 NOTE — Addendum Note (Signed)
Addended by: Agnes Lawrence on: 05/08/2021 09:08 AM   Modules accepted: Orders

## 2021-05-12 ENCOUNTER — Other Ambulatory Visit (HOSPITAL_COMMUNITY): Payer: Self-pay

## 2021-05-16 ENCOUNTER — Encounter: Payer: Self-pay | Admitting: Family Medicine

## 2021-06-10 ENCOUNTER — Other Ambulatory Visit (HOSPITAL_COMMUNITY): Payer: Self-pay

## 2021-06-10 ENCOUNTER — Encounter: Payer: Self-pay | Admitting: Family Medicine

## 2021-06-10 ENCOUNTER — Ambulatory Visit (INDEPENDENT_AMBULATORY_CARE_PROVIDER_SITE_OTHER): Payer: No Typology Code available for payment source | Admitting: Family Medicine

## 2021-06-10 VITALS — BP 118/70 | HR 86 | Temp 96.5°F | Ht 67.0 in | Wt 257.0 lb

## 2021-06-10 DIAGNOSIS — F339 Major depressive disorder, recurrent, unspecified: Secondary | ICD-10-CM

## 2021-06-10 DIAGNOSIS — M545 Low back pain, unspecified: Secondary | ICD-10-CM

## 2021-06-10 DIAGNOSIS — G8929 Other chronic pain: Secondary | ICD-10-CM

## 2021-06-10 DIAGNOSIS — R198 Other specified symptoms and signs involving the digestive system and abdomen: Secondary | ICD-10-CM | POA: Diagnosis not present

## 2021-06-10 DIAGNOSIS — E1169 Type 2 diabetes mellitus with other specified complication: Secondary | ICD-10-CM

## 2021-06-10 DIAGNOSIS — R635 Abnormal weight gain: Secondary | ICD-10-CM | POA: Diagnosis not present

## 2021-06-10 DIAGNOSIS — Z1211 Encounter for screening for malignant neoplasm of colon: Secondary | ICD-10-CM

## 2021-06-10 MED ORDER — SEMAGLUTIDE (1 MG/DOSE) 4 MG/3ML ~~LOC~~ SOPN
1.0000 mg | PEN_INJECTOR | SUBCUTANEOUS | 3 refills | Status: DC
  Filled 2021-06-10: qty 9, 84d supply, fill #0
  Filled 2021-12-31 (×2): qty 9, 84d supply, fill #1

## 2021-06-10 MED ORDER — CELECOXIB 200 MG PO CAPS
200.0000 mg | ORAL_CAPSULE | Freq: Two times a day (BID) | ORAL | 2 refills | Status: DC | PRN
  Filled 2021-06-10: qty 60, 30d supply, fill #0
  Filled 2021-07-08: qty 60, 30d supply, fill #1
  Filled 2021-08-15: qty 60, 30d supply, fill #2

## 2021-06-10 NOTE — Progress Notes (Signed)
Adam Mcdonald DOB: [birth year] Encounter date: 06/10/2021  This is a 56 y.o. male who presents with Chief Complaint  Patient presents with   Follow-up    History of present illness: Last visit was 03/23/21. He had just accepted a new job at that time. We continued with pristiq and wellbutrin.   Enjoying new job, just a big change from what he was doing. Achilles is doing well esp given all the walking he is doing. Initially hurt, but better now.  Lower back has bothered him somewhat with all the walking. Using voltaren on back/hips.  Was adding aleve on bad days. Does feel like aleve worked better. Trying to work on stretches. Hard to find energy for exercise after work. Typically walks 4-5 miles/day.   Stomach feels hard,maybe bloated. Taken metamucil, dulcolate. Just doesn't feel right.   Not losing weight with all walking. Appetite is increased with job, but doesn't feel like he is overdoing it with eating.   Patient was referred to Dr. Milinda Pointer for achilles tendon surgery. Last visit with him was 03/31/21 when he was released to full duty back at work. Initial visit was 06/29/18. Had repair 02/16/19. Dr.hyatt did request follow up q 6 months to ensure healthy achilles.  Patient was referred to Dr. Johney Frame and saw her 09/02/20 for evaluation of shortness of breath. This has completely resolved, but she follows him for hyperlipidemia,aortic root dilation and ascending aorta via yearly surveillance.   He has been on the ozempic 0.5mg  weekly for last few weeks. He is out of medication.    Allergies  Allergen Reactions   Other     Thimerosal (eyes only)   Trazodone And Nefazodone     Dry mouth   Current Meds  Medication Sig   Acetaminophen (TYLENOL PO) Take by mouth as needed.   buPROPion (WELLBUTRIN XL) 300 MG 24 hr tablet Take 1 tablet (300 mg total) by mouth daily.   desvenlafaxine (PRISTIQ) 50 MG 24 hr tablet Take 1 tablet by mouth daily.   Ibuprofen (ADVIL PO) Take by mouth as  needed.   losartan-hydrochlorothiazide (HYZAAR) 100-25 MG tablet Take 1 tablet by mouth daily.   meloxicam (MOBIC) 15 MG tablet Take 1 tablet by mouth daily.   Multiple Vitamin (MULTI-VITAMIN DAILY PO) Take by mouth.   Omega 3 1200 MG CAPS Take by mouth.   rosuvastatin (CRESTOR) 10 MG tablet Take 1 tablet (10 mg total) by mouth daily.   Semaglutide,0.25 or 0.5MG /DOS, (OZEMPIC, 0.25 OR 0.5 MG/DOSE,) 2 MG/1.5ML SOPN Inject 0.25mg  into the skin weekly for 4 weeks, then increase to 0.5mg  weekly   VITAMIN D PO Take 5,000 Units by mouth daily.    Review of Systems  Constitutional:  Negative for chills, fatigue and fever.  Respiratory:  Negative for cough, chest tightness, shortness of breath and wheezing.   Cardiovascular:  Negative for chest pain, palpitations and leg swelling.   Objective:  BP 118/70 (BP Location: Right Arm, Patient Position: Sitting, Cuff Size: Large)    Pulse 86    Temp (!) 96.5 F (35.8 C) (Axillary)    Ht 5\' 7"  (1.702 m)    Wt 257 lb (116.6 kg)    SpO2 97%    BMI 40.25 kg/m   Weight: 257 lb (116.6 kg)   BP Readings from Last 3 Encounters:  06/10/21 118/70  03/23/21 128/80  03/02/21 124/80   Wt Readings from Last 3 Encounters:  06/10/21 257 lb (116.6 kg)  03/23/21 252 lb 11.2 oz (114.6 kg)  03/02/21 253 lb 12.8 oz (115.1 kg)    Physical Exam Constitutional:      General: He is not in acute distress.    Appearance: He is well-developed.  HENT:     Head: Normocephalic and atraumatic.  Cardiovascular:     Rate and Rhythm: Normal rate and regular rhythm.     Heart sounds: Normal heart sounds. No murmur heard. Pulmonary:     Effort: Pulmonary effort is normal.     Breath sounds: Normal breath sounds.  Abdominal:     General: Abdomen is protuberant. Bowel sounds are normal. There is no distension.     Palpations: Abdomen is soft.     Tenderness: There is no abdominal tenderness. There is no guarding.  Feet:     Comments: Normal bilateral monofilament foot  exam.  Mild heel callus.  Skin is intact. Skin:    General: Skin is warm and dry.     Comments: Sensory exam of the foot is normal, tested with the monofilament. Good pulses, no lesions or ulcers, good peripheral pulses.  Psychiatric:        Judgment: Judgment normal.   Depression screen [facility] 2/9 06/10/2021 03/23/2021 03/02/2021 11/24/2020 04/14/2020  Decreased Interest 1 1 1 3 1   Down, Depressed, Hopeless 0 1 3 3  0  PHQ - 2 Score 1 2 4 6 1   Altered sleeping 1 3 3 3 1   Tired, decreased energy 1 2 3  0 1  Change in appetite 1 0 0 0 0  Feeling bad or failure about yourself  0 0 3 - 0  Trouble concentrating 0 0 0 2 0  Moving slowly or fidgety/restless 0 0 0 0 0  Suicidal thoughts 0 0 1 0 0  PHQ-9 Score 4 7 14 11 3   Difficult doing work/chores - - - Somewhat difficult -  Some recent data might be hidden     Assessment/Plan 1. Depression, recurrent ([place]) Mood is currently doing well on pristiq and wellbutrin. He is happy with current job and PHQ score reflects improvement.   2. Weight gain We we will recheck some blood work.  I was apprised with weight gain in setting of Ozempic and increased activity level.  We are going to increase Ozempic to aim for a more weight loss benefiting dose.  Hopefully this will help.  Keep up with high activity level.  Keep working on healthy eating.  3. Chronic low back pain without sciatica, unspecified back pain laterality This is intermittent.  Meloxicam is not bad enough to cover for pain.  Aleve is not completely helping relieve pain.  We are going to try Celebrex to see how he does with this.  He will use just intermittently.  We discussed working on regular stretching and consideration for physical therapy if any worsening.  4. Abdominal fullness Exam today is benign.  Start with blood work and consider further evaluation pending these results and patient symptoms. - CBC with Differential/Platelet; Future - Comprehensive metabolic panel; Future - CBC  with Differential/Platelet - Comprehensive metabolic panel  5. Controlled type 2 diabetes mellitus with other specified complication, without long-term current use of insulin (HCC) - Hemoglobin A1c; Future - Hemoglobin A1c  6. Colon cancer screening - Cologuard   Return in about 3 months (around 09/08/2021) for Chronic condition visit.      Micheline Rough, MD

## 2021-06-11 LAB — HEMOGLOBIN A1C: Hgb A1c MFr Bld: 6.3 % (ref 4.6–6.5)

## 2021-06-11 LAB — COMPREHENSIVE METABOLIC PANEL
ALT: 67 U/L — ABNORMAL HIGH (ref 0–53)
AST: 43 U/L — ABNORMAL HIGH (ref 0–37)
Albumin: 4.6 g/dL (ref 3.5–5.2)
Alkaline Phosphatase: 56 U/L (ref 39–117)
BUN: 18 mg/dL (ref 6–23)
CO2: 26 mEq/L (ref 19–32)
Calcium: 9.7 mg/dL (ref 8.4–10.5)
Chloride: 101 mEq/L (ref 96–112)
Creatinine, Ser: 1.14 mg/dL (ref 0.40–1.50)
GFR: 73.39 mL/min (ref >60.00)
Glucose, Bld: 85 mg/dL (ref 70–99)
Potassium: 4 mEq/L (ref 3.5–5.1)
Sodium: 135 mEq/L (ref 135–145)
Total Bilirubin: 0.8 mg/dL (ref 0.2–1.2)
Total Protein: 7.6 g/dL (ref 6.0–8.3)

## 2021-06-11 LAB — CBC WITH DIFFERENTIAL/PLATELET
Basophils Absolute: 0.1 10*3/uL (ref 0.0–0.1)
Basophils Relative: 0.8 % (ref 0.0–3.0)
Eosinophils Absolute: 0.2 10*3/uL (ref 0.0–0.7)
Eosinophils Relative: 1.8 % (ref 0.0–5.0)
HCT: 47.9 % (ref 39.0–52.0)
Hemoglobin: 16.3 g/dL (ref 13.0–17.0)
Lymphocytes Relative: 27.8 % (ref 12.0–46.0)
Lymphs Abs: 2.8 10*3/uL (ref 0.7–4.0)
MCHC: 34 g/dL (ref 30.0–36.0)
MCV: 89.8 fl (ref 78.0–100.0)
Monocytes Absolute: 1 10*3/uL (ref 0.1–1.0)
Monocytes Relative: 9.4 % (ref 3.0–12.0)
Neutro Abs: 6.1 10*3/uL (ref 1.4–7.7)
Neutrophils Relative %: 60.2 % (ref 43.0–77.0)
Platelets: 315 10*3/uL (ref 150.0–400.0)
RBC: 5.33 Mil/uL (ref 4.22–5.81)
RDW: 13.5 % (ref 11.5–15.5)
WBC: 10.1 10*3/uL (ref 4.0–10.5)

## 2021-06-16 ENCOUNTER — Telehealth: Payer: Self-pay | Admitting: *Deleted

## 2021-06-16 NOTE — Telephone Encounter (Signed)
Patient informed of the message below and stated he would prefer to complete the Cologuard.

## 2021-06-16 NOTE — Telephone Encounter (Signed)
-----   Message from Caren Macadam, MD sent at 06/13/2021  1:43 PM EST ----- I have ordered cologuard for patient. He is due jan 2023. Please just let him know to expect this. If he prefers to have colonoscopy instead for screening I am fine with that referral.

## 2021-06-22 ENCOUNTER — Other Ambulatory Visit (HOSPITAL_COMMUNITY): Payer: Self-pay

## 2021-07-06 ENCOUNTER — Encounter: Payer: Self-pay | Admitting: Family Medicine

## 2021-07-08 IMAGING — CR DG HAND COMPLETE 3+V*L*
3 series · 3 of 3 positions shown · non-contrast
Comparison: None.

CLINICAL DATA: Posttraumatic left hand pain

EXAM:
LEFT HAND - COMPLETE 3+ VIEW

[hand pa]
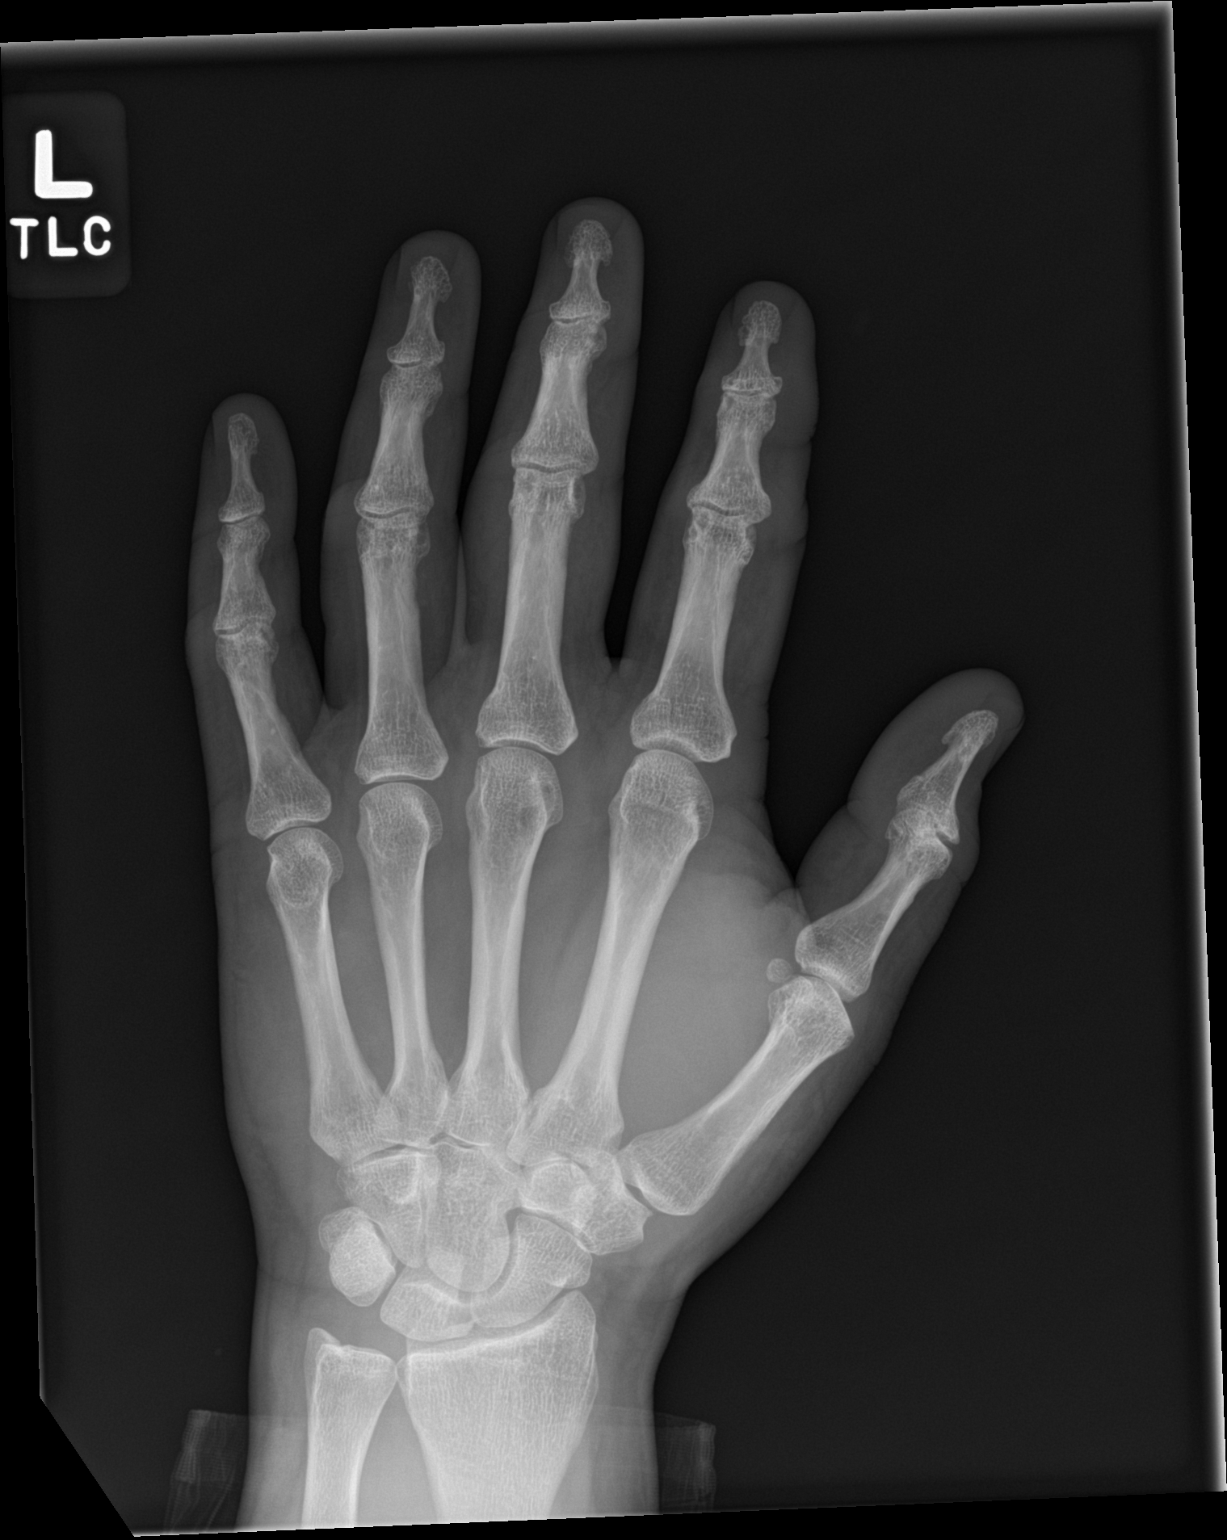

[hand obl]
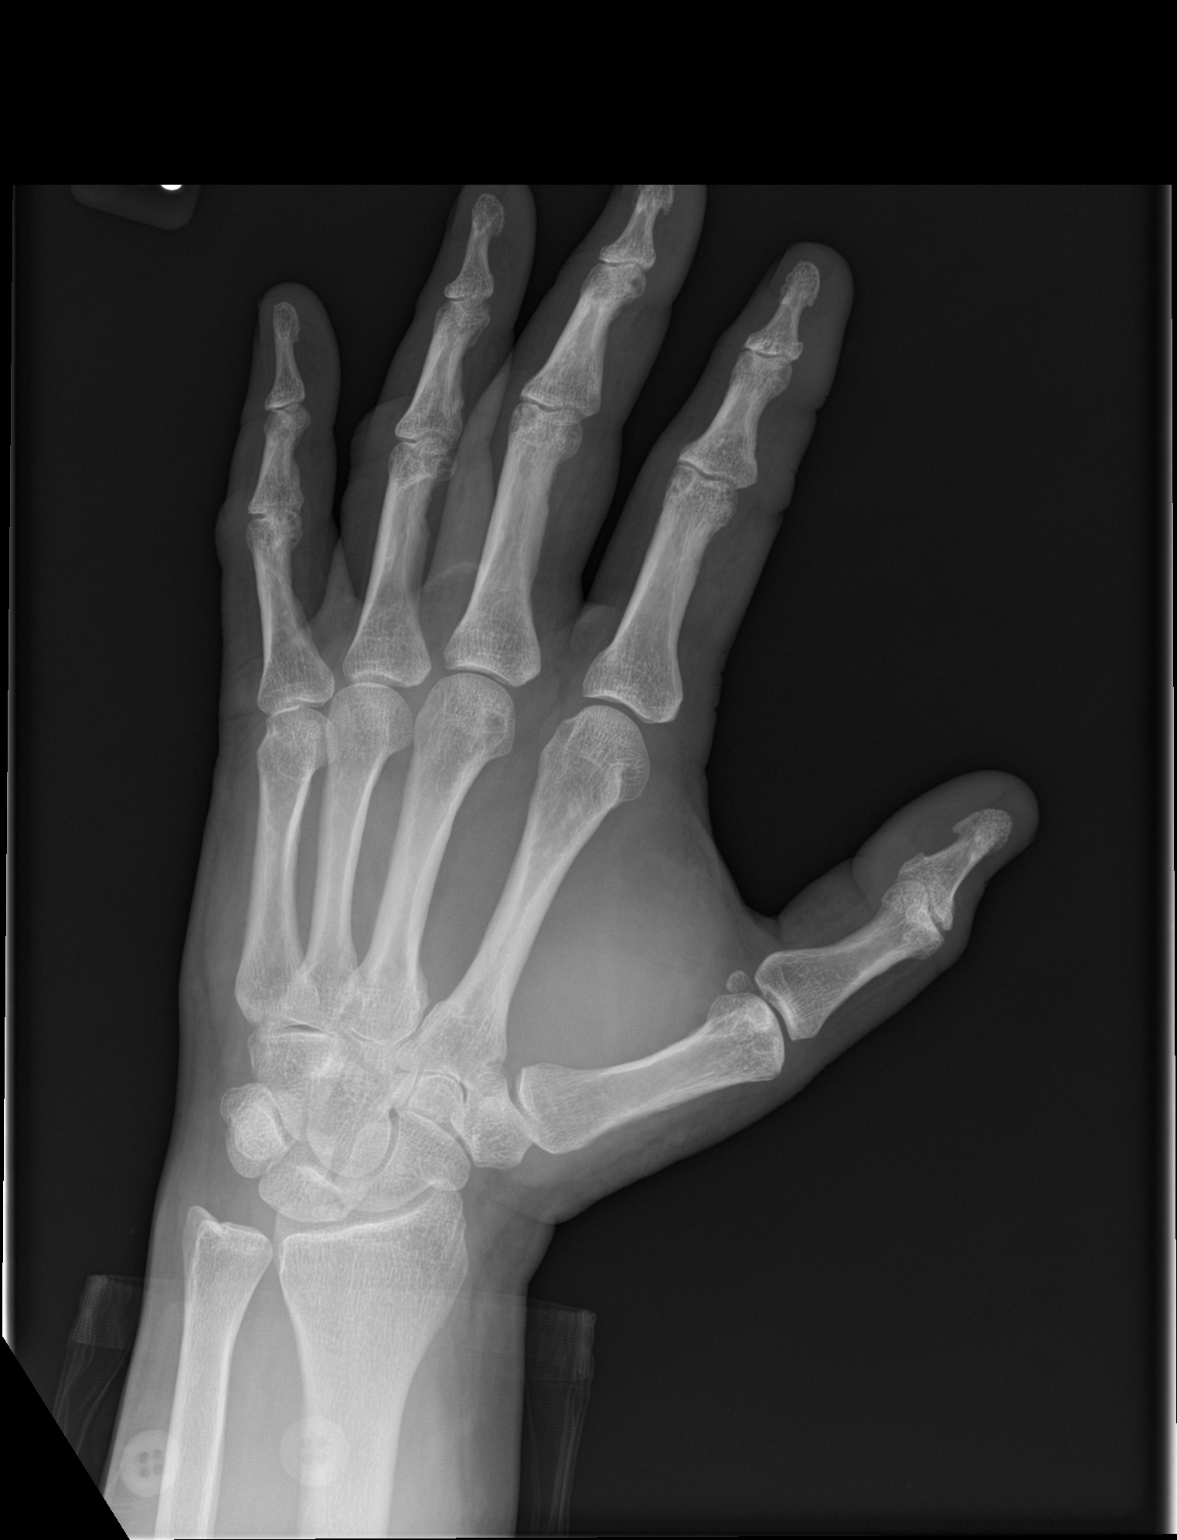

[hand lat]
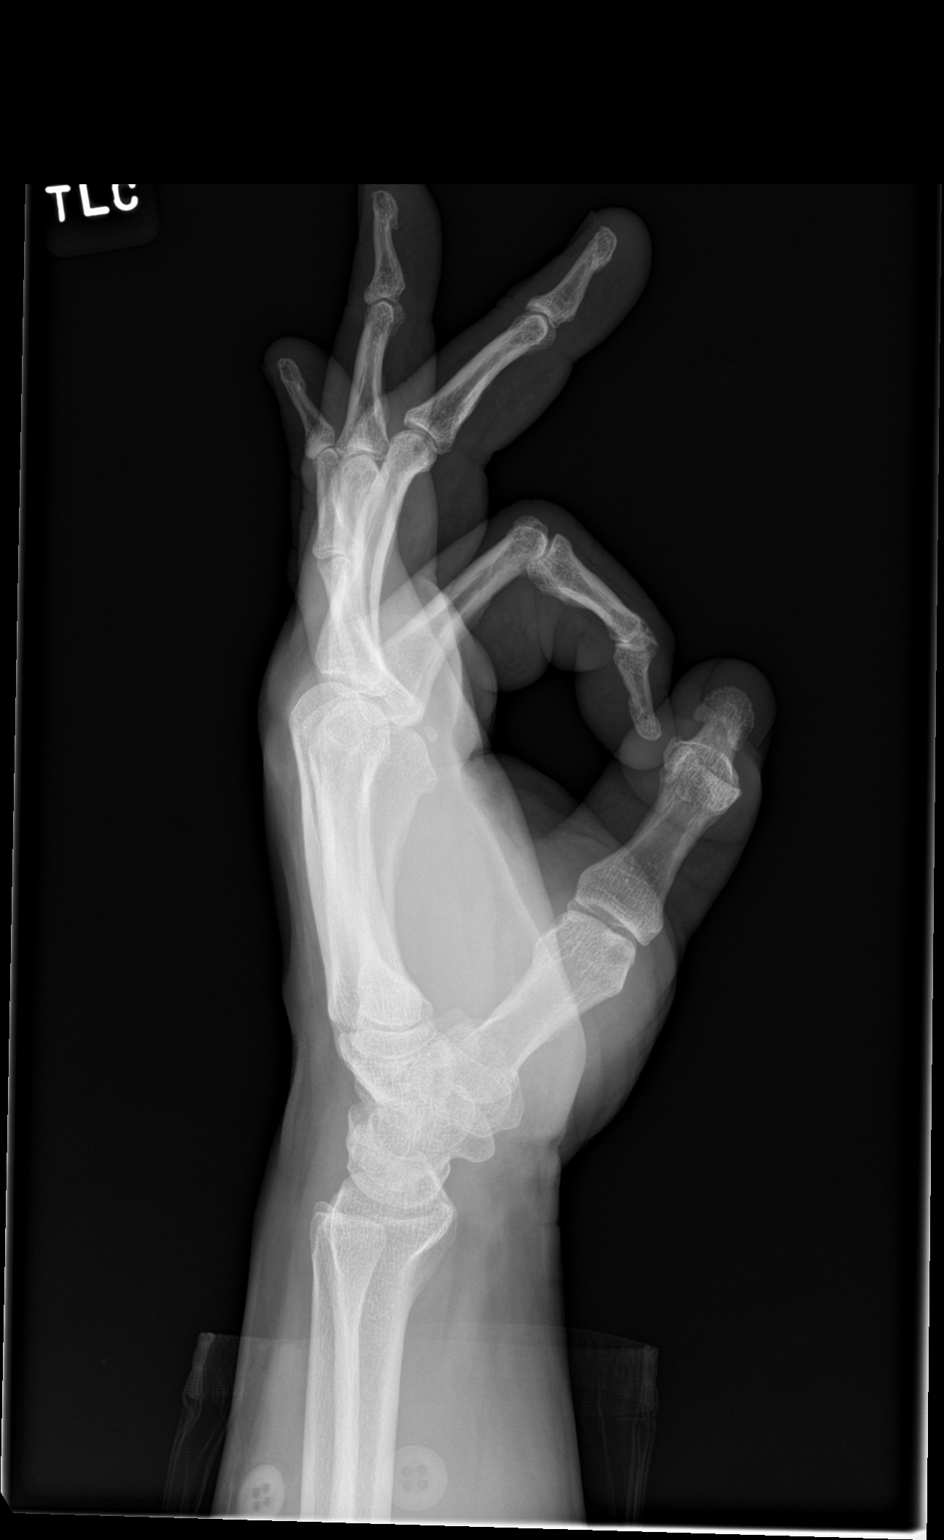

[3 of 3 positions shown; findings below may reference images not displayed]

FINDINGS: There is no evidence of fracture or dislocation.

Osteoarthritic narrowing and spurring at interphalangeal joints
especially of the index finger.

No acute soft tissue finding
IMPRESSION: Negative for fracture or subluxation.

## 2021-07-09 ENCOUNTER — Other Ambulatory Visit (HOSPITAL_COMMUNITY): Payer: Self-pay

## 2021-07-24 LAB — COLOGUARD: COLOGUARD: NEGATIVE

## 2021-07-29 ENCOUNTER — Ambulatory Visit (INDEPENDENT_AMBULATORY_CARE_PROVIDER_SITE_OTHER): Payer: No Typology Code available for payment source | Admitting: Family Medicine

## 2021-07-29 ENCOUNTER — Encounter: Payer: Self-pay | Admitting: Family Medicine

## 2021-07-29 ENCOUNTER — Other Ambulatory Visit (HOSPITAL_COMMUNITY): Payer: Self-pay

## 2021-07-29 VITALS — BP 102/72 | HR 76 | Temp 98.2°F | Ht 68.75 in | Wt 251.3 lb

## 2021-07-29 DIAGNOSIS — E1169 Type 2 diabetes mellitus with other specified complication: Secondary | ICD-10-CM

## 2021-07-29 DIAGNOSIS — Z0000 Encounter for general adult medical examination without abnormal findings: Secondary | ICD-10-CM

## 2021-07-29 DIAGNOSIS — H6093 Unspecified otitis externa, bilateral: Secondary | ICD-10-CM

## 2021-07-29 DIAGNOSIS — G4733 Obstructive sleep apnea (adult) (pediatric): Secondary | ICD-10-CM

## 2021-07-29 DIAGNOSIS — F339 Major depressive disorder, recurrent, unspecified: Secondary | ICD-10-CM

## 2021-07-29 DIAGNOSIS — B360 Pityriasis versicolor: Secondary | ICD-10-CM

## 2021-07-29 DIAGNOSIS — I10 Essential (primary) hypertension: Secondary | ICD-10-CM

## 2021-07-29 MED ORDER — NEOMYCIN-POLYMYXIN-HC 3.5-10000-1 OT SOLN
3.0000 [drp] | Freq: Four times a day (QID) | OTIC | 0 refills | Status: DC
  Filled 2021-07-29: qty 10, 7d supply, fill #0

## 2021-07-29 MED ORDER — SEMAGLUTIDE (2 MG/DOSE) 8 MG/3ML ~~LOC~~ SOPN
2.0000 mg | PEN_INJECTOR | SUBCUTANEOUS | 1 refills | Status: DC
  Filled 2021-08-17: qty 9, 84d supply, fill #0
  Filled 2021-12-27 – 2021-12-31 (×2): qty 9, 84d supply, fill #1

## 2021-07-29 MED ORDER — FLUCONAZOLE 150 MG PO TABS
ORAL_TABLET | ORAL | 0 refills | Status: DC
  Filled 2021-07-29: qty 4, 28d supply, fill #0

## 2021-07-29 NOTE — Progress Notes (Signed)
Adam Mcdonald DOB: Apr 22, 1966 Encounter date: 07/29/2021  This is a 56 y.o. male who presents for complete physical   History of present illness/Additional concerns: Last visit was in January.  We increased Ozempic at last visit to 1 mg weekly. He has enough ozempic through the end of march, but at that time insurance will only cover ozempic with diagnosis of diabetes (which he has) and also a trial of metformin, sulfonylurea, pioglitazone. Took a few weeks to note changes with appetite, but noted that weight started to decrease in last couple of weeks. Not eating healthy.   Depression: Wellbutrin 300 mg, Pristiq 50 mg. Mood has been doing ok. 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 Messes up sleep schedule and then ends up eating more unhealthy food to keep self going through the day. Anticipates that this will last for a few more months.   Still walking over 10,000 steps/day at work.   Change to celebrex has been great for him. Really helped with lower back/hip pain. Takes celebrex in the morning, and a few nights/week but not every night.   Cologuard test - 07/18/2021.  Repeat in 3 years.   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   buPROPion (WELLBUTRIN XL) 300 MG 24 hr tablet Take 1 tablet (300 mg total) by mouth daily.   celecoxib (CELEBREX) 200 MG capsule Take 1 capsule (200 mg total) by mouth 2 (two) times daily as needed.   desvenlafaxine (PRISTIQ) 50 MG  24 hr tablet Take 1 tablet by mouth daily.   losartan-hydrochlorothiazide (HYZAAR) 100-25 MG tablet Take 1 tablet by mouth daily.   Multiple Vitamin (MULTI-VITAMIN DAILY PO) Take by mouth.   Omega 3 1200 MG CAPS Take by mouth.   rosuvastatin (CRESTOR) 10 MG tablet Take 1 tablet (10 mg total) by mouth daily.   Semaglutide, 1 MG/DOSE, 4 MG/3ML SOPN Inject 1 mg as directed once a week.   Semaglutide, 2 MG/DOSE, 8 MG/3ML SOPN Inject 2 mg as directed once a week.   VITAMIN D PO Take 5,000 Units by mouth daily.   Social History   Tobacco Use   Smoking status: Never   Smokeless tobacco: Never  Substance Use Topics   Alcohol use: Not Currently    Comment: nothing since June 2021   Family History  Problem Relation Age of Onset   Diabetes Mother    Depression Mother    High blood pressure Father    Stroke Maternal Grandfather    Heart attack Paternal Grandmother 8   Heart attack Paternal Grandfather 74     Review of Systems  Constitutional:  Negative for activity change, appetite change, chills, fatigue, fever and unexpected weight change.  HENT:  Negative for congestion, ear pain, hearing loss, sinus pressure, sinus pain, sore throat and trouble swallowing.   Eyes:  Negative for  pain and visual disturbance.  Respiratory:  Negative for cough, chest tightness, shortness of breath and wheezing.   Cardiovascular:  Negative for chest pain, palpitations and leg swelling.  Gastrointestinal:  Negative for abdominal distention, abdominal pain, blood in stool, constipation, diarrhea, nausea and vomiting.  Genitourinary:  Negative for decreased urine volume, difficulty urinating, dysuria, penile pain and testicular pain.  Musculoskeletal:  Negative for arthralgias, back pain and joint swelling.  Skin:  Negative for rash.  Neurological:  Negative for dizziness, weakness, numbness and headaches.  Hematological:  Negative for adenopathy. Does not bruise/bleed easily.  Psychiatric/Behavioral:   Negative for agitation, sleep disturbance and suicidal ideas. The patient is not nervous/anxious.    CBC:  Lab Results  Component Value Date   WBC 10.1 06/10/2021   HGB 16.3 06/10/2021   HCT 47.9 06/10/2021   MCH 31.8 05/20/2020   MCHC 34.0 06/10/2021   RDW 13.5 06/10/2021   PLT 315.0 06/10/2021   CMP: Lab Results  Component Value Date   NA 135 06/10/2021   K 4.0 06/10/2021   CL 101 06/10/2021   CO2 26 06/10/2021   ANIONGAP 11 05/20/2020   GLUCOSE 85 06/10/2021   BUN 18 06/10/2021   CREATININE 1.14 06/10/2021   CALCIUM 9.7 06/10/2021   PROT 7.6 06/10/2021   BILITOT 0.8 06/10/2021   ALKPHOS 56 06/10/2021   ALT 67 (H) 06/10/2021   AST 43 (H) 06/10/2021   LIPID: Lab Results  Component Value Date   CHOL 114 11/17/2020   TRIG 161.0 (H) 11/17/2020   HDL 31.50 (L) 11/17/2020   LDLCALC 50 11/17/2020    Objective:  BP 102/72 (BP Location: Left Arm, Patient Position: Sitting, Cuff Size: Large)    Pulse 76    Temp 98.2 F (36.8 C) (Oral)    Ht 5' 8.75" (1.746 m)    Wt 251 lb 4.8 oz (114 kg)    SpO2 95%    BMI 37.38 kg/m   Weight: 251 lb 4.8 oz (114 kg)   BP Readings from Last 3 Encounters:  07/29/21 102/72  06/10/21 118/70  03/23/21 128/80   Wt Readings from Last 3 Encounters:  07/29/21 251 lb 4.8 oz (114 kg)  06/10/21 257 lb (116.6 kg)  03/23/21 252 lb 11.2 oz (114.6 kg)    Physical Exam Constitutional:      General: He is not in acute distress.    Appearance: He is well-developed.  HENT:     Head: Normocephalic and atraumatic.     Right Ear: External ear normal.     Left Ear: External ear normal.     Ears:     Comments: Purulent discharge, edema ear canal    Nose: Nose normal.     Mouth/Throat:     Pharynx: No oropharyngeal exudate.  Eyes:     Conjunctiva/sclera: Conjunctivae normal.     Pupils: Pupils are equal, round, and reactive to light.  Neck:     Thyroid: No thyromegaly.  Cardiovascular:     Rate and Rhythm: Normal rate and regular rhythm.      Heart sounds: Normal heart sounds. No murmur heard.   No friction rub. No gallop.  Pulmonary:     Effort: Pulmonary effort is normal. No respiratory distress.     Breath sounds: Normal breath sounds. No stridor. No wheezing or rales.  Abdominal:     General: Bowel sounds are normal.     Palpations: Abdomen is soft.  Musculoskeletal:        General:  Normal range of motion.     Cervical back: Neck supple.  Skin:    General: Skin is warm and dry.     Comments: See pic upper back; macular erythematous rash  Neurological:     Mental Status: He is alert and oriented to person, place, and time.  Psychiatric:        Behavior: Behavior normal.        Thought Content: Thought content normal.        Judgment: Judgment normal.    Assessment/Plan: Health Maintenance Due  Topic Date Due   FOOT EXAM  Never done   Health Maintenance reviewed.  1. Preventative health care Keep up work with healthy eating and regular activity.  2. Primary hypertension Well controlled; continue with current medications.  - Comprehensive metabolic panel; Future  3. Obstructive sleep apnea Continue with cpap.   4. Controlled type 2 diabetes mellitus with other specified complication, without long-term current use of insulin ([place]) Continue with ozempic. Due to insurance we may need to change to try one of above suggestions. j - Hemoglobin A1c; Future  5. Depression, recurrent ([place]) Doing well with current meds!  6. Tinea versicolor - fluconazole (DIFLUCAN) 150 MG tablet; Take 1 tablet bvy mouth weekly  Dispense: 4 tablet; Refill: 0  7. Otitis externa of both ears, unspecified chronicity, unspecified type - neomycin-polymyxin-hydrocortisone (CORTISPORIN) OTIC solution; Place 3 drops into both ears 4 (four) times daily for 7 days.  Dispense: 10 mL; Refill: 0   Return in about 3 months (around 10/26/2021) for Chronic condition visit.  Micheline Rough, MD

## 2021-08-05 ENCOUNTER — Emergency Department (HOSPITAL_BASED_OUTPATIENT_CLINIC_OR_DEPARTMENT_OTHER)
Admission: EM | Admit: 2021-08-05 | Discharge: 2021-08-05 | Disposition: A | Discharge status: Home / Self Care | Payer: No Typology Code available for payment source | Attending: Emergency Medicine | Admitting: Emergency Medicine

## 2021-08-05 ENCOUNTER — Telehealth: Payer: No Typology Code available for payment source | Admitting: Physician Assistant

## 2021-08-05 ENCOUNTER — Other Ambulatory Visit (HOSPITAL_BASED_OUTPATIENT_CLINIC_OR_DEPARTMENT_OTHER): Payer: Self-pay

## 2021-08-05 ENCOUNTER — Other Ambulatory Visit: Payer: Self-pay

## 2021-08-05 ENCOUNTER — Encounter (HOSPITAL_BASED_OUTPATIENT_CLINIC_OR_DEPARTMENT_OTHER): Payer: Self-pay | Admitting: Emergency Medicine

## 2021-08-05 DIAGNOSIS — I10 Essential (primary) hypertension: Secondary | ICD-10-CM | POA: Diagnosis not present

## 2021-08-05 DIAGNOSIS — M791 Myalgia, unspecified site: Secondary | ICD-10-CM | POA: Diagnosis not present

## 2021-08-05 DIAGNOSIS — R197 Diarrhea, unspecified: Secondary | ICD-10-CM | POA: Insufficient documentation

## 2021-08-05 DIAGNOSIS — Z20822 Contact with and (suspected) exposure to covid-19: Secondary | ICD-10-CM | POA: Diagnosis not present

## 2021-08-05 DIAGNOSIS — R1030 Lower abdominal pain, unspecified: Secondary | ICD-10-CM | POA: Insufficient documentation

## 2021-08-05 DIAGNOSIS — M549 Dorsalgia, unspecified: Secondary | ICD-10-CM | POA: Diagnosis not present

## 2021-08-05 DIAGNOSIS — A084 Viral intestinal infection, unspecified: Secondary | ICD-10-CM

## 2021-08-05 DIAGNOSIS — R112 Nausea with vomiting, unspecified: Secondary | ICD-10-CM | POA: Insufficient documentation

## 2021-08-05 LAB — COMPREHENSIVE METABOLIC PANEL
ALT: 44 U/L (ref 0–44)
AST: 24 U/L (ref 15–41)
Albumin: 4.8 g/dL (ref 3.5–5.0)
Alkaline Phosphatase: 51 U/L (ref 38–126)
Anion gap: 12 (ref 5–15)
BUN: 31 mg/dL — ABNORMAL HIGH (ref 6–20)
CO2: 24 mmol/L (ref 22–32)
Calcium: 9.2 mg/dL (ref 8.9–10.3)
Chloride: 100 mmol/L (ref 98–111)
Creatinine, Ser: 1.28 mg/dL — ABNORMAL HIGH (ref 0.61–1.24)
GFR, Estimated: >60 mL/min (ref >60)
Glucose, Bld: 135 mg/dL — ABNORMAL HIGH (ref 70–99)
Potassium: 3.4 mmol/L — ABNORMAL LOW (ref 3.5–5.1)
Sodium: 136 mmol/L (ref 135–145)
Total Bilirubin: 1.1 mg/dL (ref 0.3–1.2)
Total Protein: 8 g/dL (ref 6.5–8.1)

## 2021-08-05 LAB — CBC
HCT: 51.4 % (ref 39.0–52.0)
Hemoglobin: 17.4 g/dL — ABNORMAL HIGH (ref 13.0–17.0)
MCH: 30.2 pg (ref 26.0–34.0)
MCHC: 33.9 g/dL (ref 30.0–36.0)
MCV: 89.2 fL (ref 80.0–100.0)
Platelets: 306 10*3/uL (ref 150–400)
RBC: 5.76 MIL/uL (ref 4.22–5.81)
RDW: 13.2 % (ref 11.5–15.5)
WBC: 11.7 10*3/uL — ABNORMAL HIGH (ref 4.0–10.5)
nRBC: 0 % (ref 0.0–0.2)

## 2021-08-05 LAB — RESP PANEL BY RT-PCR (FLU A&B, COVID) ARPGX2
Influenza A by PCR: NEGATIVE
Influenza B by PCR: NEGATIVE
SARS Coronavirus 2 by RT PCR: NEGATIVE

## 2021-08-05 LAB — LIPASE, BLOOD: Lipase: 24 U/L (ref 11–51)

## 2021-08-05 MED ORDER — SODIUM CHLORIDE 0.9 % IV BOLUS
1000.0000 mL | Freq: Once | INTRAVENOUS | Status: AC
  Administered 2021-08-05: 1000 mL via INTRAVENOUS

## 2021-08-05 MED ORDER — ONDANSETRON HCL 4 MG/2ML IJ SOLN
4.0000 mg | Freq: Once | INTRAMUSCULAR | Status: AC | PRN
Start: 2021-08-05 — End: 2021-08-05
  Administered 2021-08-05: 4 mg via INTRAVENOUS
  Filled 2021-08-05: qty 2

## 2021-08-05 MED ORDER — ONDANSETRON 4 MG PO TBDP
4.0000 mg | ORAL_TABLET | Freq: Three times a day (TID) | ORAL | 0 refills | Status: DC | PRN
  Filled 2021-08-05: qty 20, 7d supply, fill #0

## 2021-08-05 NOTE — Discharge Instructions (Signed)

## 2021-08-05 NOTE — ED Triage Notes (Signed)
Vomiting and diarrhea, x 2 days. Headache and neck pain, all over body aches.  ?

## 2021-08-05 NOTE — Patient Instructions (Signed)
Cora Collum, thank you for joining Mar Daring, PA-C for today's virtual visit.  While this provider is not your primary care provider (PCP), if your PCP is located in our provider database this encounter information will be shared with them immediately following your visit.  Consent: (Patient) Taten Merrow provided verbal consent for this virtual visit at the beginning of the encounter.  Current Medications:  Current Outpatient Medications:    Acetaminophen (TYLENOL PO), Take by mouth as needed., Disp: , Rfl:    buPROPion (WELLBUTRIN XL) 300 MG 24 hr tablet, Take 1 tablet (300 mg total) by mouth daily., Disp: 90 tablet, Rfl: 1   celecoxib (CELEBREX) 200 MG capsule, Take 1 capsule (200 mg total) by mouth 2 (two) times daily as needed., Disp: 60 capsule, Rfl: 2   desvenlafaxine (PRISTIQ) 50 MG 24 hr tablet, Take 1 tablet by mouth daily., Disp: 90 tablet, Rfl: 1   fluconazole (DIFLUCAN) 150 MG tablet, Take 1 tablet by mouth weekly, Disp: 4 tablet, Rfl: 0   losartan-hydrochlorothiazide (HYZAAR) 100-25 MG tablet, Take 1 tablet by mouth daily., Disp: 90 tablet, Rfl: 1   Multiple Vitamin (MULTI-VITAMIN DAILY PO), Take by mouth., Disp: , Rfl:    neomycin-polymyxin-hydrocortisone (CORTISPORIN) OTIC solution, Place 3 drops into both ears 4 (four) times daily for 7 days., Disp: 10 mL, Rfl: 0   Omega 3 1200 MG CAPS, Take by mouth., Disp: , Rfl:    rosuvastatin (CRESTOR) 10 MG tablet, Take 1 tablet (10 mg total) by mouth daily., Disp: 90 tablet, Rfl: 2   Semaglutide, 1 MG/DOSE, 4 MG/3ML SOPN, Inject 1 mg as directed once a week., Disp: 9 mL, Rfl: 3   Semaglutide, 2 MG/DOSE, 8 MG/3ML SOPN, Inject 2 mg as directed once a week., Disp: 9 mL, Rfl: 1   VITAMIN D PO, Take 5,000 Units by mouth daily., Disp: , Rfl:    Medications ordered in this encounter:  No orders of the defined types were placed in this encounter.    *If you need refills on other medications prior to your next appointment,  please contact your pharmacy*  Follow-Up: Call back or seek an in-person evaluation if the symptoms worsen or if the condition fails to improve as anticipated.  Other Instructions Viral Gastroenteritis, Adult Viral gastroenteritis is also known as the stomach flu. This condition may affect your stomach, small intestine, and large intestine. It can cause sudden watery diarrhea, fever, and vomiting. This condition is caused by many different viruses. These viruses can be passed from person to person very easily (are contagious). Diarrhea and vomiting can make you feel weak and cause you to become dehydrated. You may not be able to keep fluids down. Dehydration can make you tired and thirsty, cause you to have a dry mouth, and decrease how often you urinate. It is important to replace the fluids that you lose from diarrhea and vomiting. What are the causes? Gastroenteritis is caused by many viruses, including rotavirus and norovirus. Norovirus is the most common cause in adults. You can get sick after being exposed to the viruses from other people. You can also get sick by: Eating food, drinking water, or touching a surface contaminated with one of these viruses. Sharing utensils or other personal items with an infected person. What increases the risk? You are more likely to develop this condition if you: Have a weak body defense system (immune system). Live with one or more children who are younger than 45 years old. Live in a nursing  home. Travel on cruise ships. What are the signs or symptoms? Symptoms of this condition start suddenly 1-3 days after exposure to a virus. Symptoms may last for a few days or for as long as a week. Common symptoms include watery diarrhea and vomiting. Other symptoms include: Fever. Headache. Fatigue. Pain in the abdomen. Chills. Weakness. Nausea. Muscle aches. Loss of appetite. How is this diagnosed? This condition is diagnosed with a medical history and  physical exam. You may also have a stool test to check for viruses or other infections. How is this treated? This condition typically goes away on its own. The focus of treatment is to prevent dehydration and restore lost fluids (rehydration). This condition may be treated with: An oral rehydration solution (ORS) to replace important salts and minerals (electrolytes) in your body. Take this if told by your health care provider. This is a drink that is sold at pharmacies and retail stores. Medicines to help with your symptoms. Probiotic supplements to reduce symptoms of diarrhea. Fluids given through an IV, if dehydration is severe. Older adults and people with other diseases or a weak immune system are at higher risk for dehydration. Follow these instructions at home: Eating and drinking  Take an ORS as told by your health care provider. Drink clear fluids in small amounts as you are able. Clear fluids include: Water. Ice chips. Diluted fruit juice. Low-calorie sports drinks. Drink enough fluid to keep your urine pale yellow. Eat small amounts of healthy foods every 3-4 hours as you are able. This may include whole grains, fruits, vegetables, lean meats, and yogurt. Avoid fluids that contain a lot of sugar or caffeine, such as energy drinks, sports drinks, and soda. Avoid spicy or fatty foods. Avoid alcohol. General instructions Wash your hands often, especially after having diarrhea or vomiting. If soap and water are not available, use hand sanitizer. Make sure that all people in your household wash their hands well and often. Take over-the-counter and prescription medicines only as told by your health care provider. Rest at home while you recover. Watch your condition for any changes. Take a warm bath to relieve any burning or pain from frequent diarrhea episodes. Keep all follow-up visits as told by your health care provider. This is important. Contact a health care provider if  you: Cannot keep fluids down. Have symptoms that get worse. Have new symptoms. Feel light-headed or dizzy. Have muscle cramps. Get help right away if you: Have chest pain. Feel extremely weak or you faint. See blood in your vomit. Have vomit that looks like coffee grounds. Have bloody or black stools or stools that look like tar. Have a severe headache, a stiff neck, or both. Have a rash. Have severe pain, cramping, or bloating in your abdomen. Have trouble breathing or you are breathing very quickly. Have a fast heartbeat. Have skin that feels cold and clammy. Feel confused. Have pain when you urinate. Have signs of dehydration, such as: Dark urine, very little urine, or no urine. Cracked lips. Dry mouth. Sunken eyes. Sleepiness. Weakness. Summary Viral gastroenteritis is also known as the stomach flu. It can cause sudden watery diarrhea, fever, and vomiting. This condition can be passed from person to person very easily (is contagious). Take an ORS if told by your health care provider. This is a drink that is sold at pharmacies and retail stores. Wash your hands often, especially after having diarrhea or vomiting. If soap and water are not available, use hand sanitizer. This information is  not intended to replace advice given to you by your health care provider. Make sure you discuss any questions you have with your health care provider. Document Revised: 11/10/2018 Document Reviewed: 03/29/2018 Elsevier Patient Education  2022 [employer].     If you have been instructed to have an in-person evaluation today at a local Urgent Care facility, please use the link below. It will take you to a list of all of our available [facility], including address, phone number and hours of operation. Please do not delay care.  [facility]  If you or a family member do not have a primary care provider, use the link below to schedule a visit and establish care.  When you choose a [facility] primary care physician or advanced practice provider, you gain a long-term partner in health. Find a Primary Care Provider  Learn more about [facility] in-office and virtual care options: [facility]

## 2021-08-05 NOTE — ED Provider Notes (Signed)
? ?Emergency Department Provider Note ? ? ?I have reviewed the triage vital signs and the nursing notes. ? ? ?HISTORY ? ?Chief Complaint ?Emesis and Diarrhea ? ? ?HPI ?Adam Mcdonald is a 56 y.o. male with past medical history reviewed below presents emergency department for evaluation of vomiting and diarrhea along with body aches starting yesterday.  No known sick contacts.  He describes soreness in his mid to lower abdomen.  No UTI symptoms.  He has had back pain typical of his chronic back pain.  Has been unable to take his oral medications due to vomiting.  No blood in the emesis or stool.  No recent antibiotics.  No recent hospitalization.  He is not immune compromised. ? ? ? ?Past Medical History:  ?Diagnosis Date  ? Chicken pox   ? Chronic pain   ? Depression   ? History of MRSA infection   ? Hypertension   ? Sleep apnea   ? Sleep apnea   ? Ventral hernia without obstruction or gangrene   ? Vitamin D deficiency   ? ? ?Review of Systems ? ?Constitutional: No fever/chills ?Eyes: No visual changes. ?ENT: No sore throat. ?Cardiovascular: Denies chest pain. ?Respiratory: Denies shortness of breath. ?Gastrointestinal: Positive abdominal pain. Positive nausea, vomiting, and diarrhea.  No constipation. ?Genitourinary: Negative for dysuria. ?Musculoskeletal: Negative for back pain. ?Skin: Negative for rash. ?Neurological: Negative for headaches, focal weakness or numbness. ? ? ?____________________________________________ ? ? ?PHYSICAL EXAM: ? ?VITAL SIGNS: ?ED Triage Vitals  ?Enc Vitals Group  ?   BP 08/05/21 1306 116/74  ?   Pulse Rate 08/05/21 1306 (!) 111  ?   Resp 08/05/21 1306 18  ?   Temp 08/05/21 1306 98.5 ?F (36.9 ?C)  ?   Temp Source 08/05/21 1511 Oral  ?   SpO2 08/05/21 1306 96 %  ?   Weight 08/05/21 1306 245 lb (111.1 kg)  ?   Height 08/05/21 1306 5\' 7"  (1.702 m)  ? ?Constitutional: Alert and oriented. Well appearing and in no acute distress. ?Eyes: Conjunctivae are normal.  ?Head: Atraumatic. ?Nose: No  congestion/rhinnorhea. ?Mouth/Throat: Mucous membranes are slightly dry.  ?Neck: No stridor.   ?Cardiovascular: mild tachycardia. Good peripheral circulation. Grossly normal heart sounds.   ?Respiratory: Normal respiratory effort.  No retractions. Lungs CTAB. ?Gastrointestinal: Soft and nontender. No distention.  ?Musculoskeletal: No lower extremity tenderness nor edema. No gross deformities of extremities. ?Neurologic:  Normal speech and language. No gross focal neurologic deficits are appreciated.  ?Skin:  Skin is warm, dry and intact. No rash noted. ? ?____________________________________________ ?  ?LABS ?(all labs ordered are listed, but only abnormal results are displayed) ? ?Labs Reviewed  ?COMPREHENSIVE METABOLIC PANEL - Abnormal; Notable for the following components:  ?    Result Value  ? Potassium 3.4 (*)   ? Glucose, Bld 135 (*)   ? BUN 31 (*)   ? Creatinine, Ser 1.28 (*)   ? All other components within normal limits  ?CBC - Abnormal; Notable for the following components:  ? WBC 11.7 (*)   ? Hemoglobin 17.4 (*)   ? All other components within normal limits  ?RESP PANEL BY RT-PCR (FLU A&B, COVID) [id number]  ?LIPASE, BLOOD  ? ?____________________________________________ ? ? ?PROCEDURES ? ?Procedure(s) performed:  ? ?Procedures ? ?None  ?____________________________________________ ? ? ?INITIAL IMPRESSION / ASSESSMENT AND PLAN / ED COURSE ? ?Pertinent labs & imaging results that were available during my care of the patient were reviewed by me and considered in my medical  decision making (see chart for details). ?  ?This patient is Presenting for Evaluation of abdominal pain, which does require a range of treatment options, and is a complaint that involves a high risk of morbidity and mortality. ? ?The Differential Diagnoses includes but is not exclusive to acute cholecystitis, intrathoracic causes for epigastric abdominal pain, gastritis, duodenitis, pancreatitis, small bowel or large bowel obstruction,  abdominal aortic aneurysm, hernia, gastritis, etc. ? ? ?Critical Interventions-  ?  ?Medications  ?ondansetron (ZOFRAN) injection 4 mg (4 mg Intravenous Given 08/05/21 1323)  ?sodium chloride 0.9 % bolus 1,000 mL (1,000 mLs Intravenous New Bag/Given 08/05/21 1557)  ? ? ?Reassessment after intervention:  Nausea improved.  ? ? ? ?I decided to review pertinent External Data, and in summary no recent ED visits. ?  ?Clinical Laboratory Tests Ordered, included mild leukocytosis to 11.7.  Lipase is normal.  LFTs and bilirubin are normal.  Creatinine slightly elevated likely in the setting of dehydration.  COVID and flu are negative. ? ?Radiologic Tests: Considered need for CT imaging but patient's abdomen is diffusely soft and nontender.  No rebound or guarding.  Lungs are also clear with normal oxygen saturation and no subjective chest pain or shortness of breath.  Defer imaging for now. ? ?Cardiac Monitor Tracing which shows sinus tachycardia.  ? ? ?Social Determinants of Health Risk no EtOH ? ? ?Medical Decision Making: Summary:  ?Patient presents emergency department for evaluation of generalized abdominal discomfort with vomiting and diarrhea.  Diffusely soft abdomen on exam.  Defer abdominal imaging for now.  Lab work is reassuring and consistent with dehydration, likely from viral process.  No evidence of DKA or other metabolic derangement.  No gap acidosis.  No significant leukocytosis. ? ?Reevaluation with update and discussion with patient. Labs are reassuring as above. Symptoms improved here in the ED. Suspect viral process clinically. Plan for supportive care and close PCP follow up. Discussed ED return precautions.  ? ?Disposition: discharge ? ?____________________________________________ ? ?FINAL CLINICAL IMPRESSION(S) / ED DIAGNOSES ? ?Final diagnoses:  ?Nausea vomiting and diarrhea  ? ? ? ?NEW OUTPATIENT MEDICATIONS STARTED DURING THIS VISIT: ? ?Discharge Medication List as of 08/05/2021  5:11 PM  ?  ? ?START  taking these medications  ? Details  ?ondansetron (ZOFRAN-ODT) 4 MG disintegrating tablet Take 1 tablet (4 mg total) by mouth every 8 (eight) hours as needed., Starting Wed 08/05/2021, Normal  ?  ?  ? ? ?Note:  This document was prepared using Dragon voice recognition software and may include unintentional dictation errors. ? ?Nanda Quinton, MD, FACEP ?Emergency Medicine ? ?  ?Margette Fast, MD ?08/14/21 [phone] ? ?

## 2021-08-05 NOTE — Progress Notes (Signed)
?Virtual Visit Consent  ? ?Adam Mcdonald, you are scheduled for a virtual visit with a [facility] provider today.   ?  ?Just as with appointments in the office, your consent must be obtained to participate.  Your consent will be active for this visit and any virtual visit you may have with one of our providers in the next 365 days.   ?  ?If you have a MyChart account, a copy of this consent can be sent to you electronically.  All virtual visits are billed to your insurance company just like a traditional visit in the office.   ? ?As this is a virtual visit, video technology does not allow for your provider to perform a traditional examination.  This may limit your provider's ability to fully assess your condition.  If your provider identifies any concerns that need to be evaluated in person or the need to arrange testing (such as labs, EKG, etc.), we will make arrangements to do so.   ?  ?Although advances in technology are sophisticated, we cannot ensure that it will always work on either your end or our end.  If the connection with a video visit is poor, the visit may have to be switched to a telephone visit.  With either a video or telephone visit, we are not always able to ensure that we have a secure connection.    ? ?I need to obtain your verbal consent now.   Are you willing to proceed with your visit today?  ?  ?Adam Mcdonald has provided verbal consent on 08/05/2021 for a virtual visit (video or telephone). ?  ?Mar Daring, PA-C  ? ?Date: 08/05/2021 12:16 PM ? ? ?Virtual Visit via Video Note  ? ?Adam Mcdonald, connected with  [name]  ([id number], 07-08-1965) on 08/05/21 at 12:15 PM EST by a video-enabled telemedicine application and verified that I am speaking with the correct person using two identifiers. ? ?Location: ?Patient: Virtual Visit Location Patient: Home ?Provider: Virtual Visit Location Provider: Home Office ?  ?I discussed the limitations of evaluation and management by  telemedicine and the availability of in person appointments. The patient expressed understanding and agreed to proceed.   ? ?History of Present Illness: ?Adam Mcdonald is a 56 y.o. who identifies as a male who was assigned male at birth, and is being seen today for nausea, vomiting, diarrhea. ? ?HPI: Emesis  ?This is a new problem. The current episode started yesterday. The problem occurs 5 to 10 times per day. The problem has been gradually worsening. The emesis has an appearance of stomach contents and bile. Associated symptoms include chills, diarrhea (explosive, watery), a fever (unknown), headaches, myalgias and sweats. Associated symptoms comments: From 9pm to 5am has been vomiting and explosive diarrhea, did have another episode of explosive diarrhea recently, but has not vomited since 5am. He has tried increased fluids and bed rest for the symptoms. The treatment provided no relief.   ? ? ?Problems:  ?Patient Active Problem List  ? Diagnosis Date Noted  ? Diabetes mellitus type II, controlled ([place]) 12/15/2020  ? Obstructive sleep apnea 11/24/2020  ? Hypertension 08/31/2019  ? Low back pain 06/23/2018  ? Depression, recurrent ([place]) 06/02/2018  ? Vitamin D deficiency 06/02/2018  ?  ?Allergies:  ?Allergies  ?Allergen Reactions  ? Other   ?  Thimerosal (eyes only)  ? Trazodone And Nefazodone   ?  Dry mouth  ? ?Medications:  ?Current Outpatient Medications:  ?  Acetaminophen (TYLENOL  PO), Take by mouth as needed., Disp: , Rfl:  ?  buPROPion (WELLBUTRIN XL) 300 MG 24 hr tablet, Take 1 tablet (300 mg total) by mouth daily., Disp: 90 tablet, Rfl: 1 ?  celecoxib (CELEBREX) 200 MG capsule, Take 1 capsule (200 mg total) by mouth 2 (two) times daily as needed., Disp: 60 capsule, Rfl: 2 ?  desvenlafaxine (PRISTIQ) 50 MG 24 hr tablet, Take 1 tablet by mouth daily., Disp: 90 tablet, Rfl: 1 ?  fluconazole (DIFLUCAN) 150 MG tablet, Take 1 tablet by mouth weekly, Disp: 4 tablet, Rfl: 0 ?  losartan-hydrochlorothiazide (HYZAAR)  100-25 MG tablet, Take 1 tablet by mouth daily., Disp: 90 tablet, Rfl: 1 ?  Multiple Vitamin (MULTI-VITAMIN DAILY PO), Take by mouth., Disp: , Rfl:  ?  neomycin-polymyxin-hydrocortisone (CORTISPORIN) OTIC solution, Place 3 drops into both ears 4 (four) times daily for 7 days., Disp: 10 mL, Rfl: 0 ?  Omega 3 1200 MG CAPS, Take by mouth., Disp: , Rfl:  ?  rosuvastatin (CRESTOR) 10 MG tablet, Take 1 tablet (10 mg total) by mouth daily., Disp: 90 tablet, Rfl: 2 ?  Semaglutide, 1 MG/DOSE, 4 MG/3ML SOPN, Inject 1 mg as directed once a week., Disp: 9 mL, Rfl: 3 ?  Semaglutide, 2 MG/DOSE, 8 MG/3ML SOPN, Inject 2 mg as directed once a week., Disp: 9 mL, Rfl: 1 ?  VITAMIN D PO, Take 5,000 Units by mouth daily., Disp: , Rfl:  ? ?Observations/Objective: ?Patient is well-developed, well-nourished in no acute distress.  ?Resting comfortably at home.  ?Head is normocephalic, atraumatic.  ?No labored breathing.  ?Speech is clear and coherent with logical content.  ?Patient is alert and oriented at baseline.  ? ? ?Assessment and Plan: ?1. Viral gastroenteritis ? ?- Patient concerned for dehydration and inability to get medications ?- Will proceed to [facility] for fluids and further evaluation ? ?Follow Up Instructions: ?I discussed the assessment and treatment plan with the patient. The patient was provided an opportunity to ask questions and all were answered. The patient agreed with the plan and demonstrated an understanding of the instructions.  A copy of instructions were sent to the patient via MyChart unless otherwise noted below.  ? ?The patient was advised to call back or seek an in-person evaluation if the symptoms worsen or if the condition fails to improve as anticipated. ? ?Time:  ?I spent 10 minutes with the patient via telehealth technology discussing the above problems/concerns.   ? ?Mar Daring, PA-C ?

## 2021-08-05 NOTE — ED Notes (Signed)
Water given for po challenge.  Pt denies nausea at this time.  ?

## 2021-08-05 NOTE — ED Notes (Signed)
Pt in bed, pt reports diarrhea and vomiting since yesterday, pt abd is soft and non tender, pt states that he hasn't had much to drink since last night.  Pt awaits md eval.   ?

## 2021-08-15 ENCOUNTER — Other Ambulatory Visit (HOSPITAL_COMMUNITY): Payer: Self-pay

## 2021-08-17 ENCOUNTER — Other Ambulatory Visit (HOSPITAL_COMMUNITY): Payer: Self-pay

## 2021-08-18 ENCOUNTER — Other Ambulatory Visit (HOSPITAL_COMMUNITY): Payer: Self-pay

## 2021-09-01 ENCOUNTER — Encounter: Payer: Self-pay | Admitting: Family Medicine

## 2021-09-22 ENCOUNTER — Other Ambulatory Visit (HOSPITAL_COMMUNITY): Payer: Self-pay

## 2021-09-22 ENCOUNTER — Other Ambulatory Visit: Payer: Self-pay | Admitting: Family Medicine

## 2021-09-22 MED ORDER — CELECOXIB 200 MG PO CAPS
200.0000 mg | ORAL_CAPSULE | Freq: Two times a day (BID) | ORAL | 2 refills | Status: DC | PRN
  Filled 2021-09-22: qty 60, 30d supply, fill #0
  Filled 2021-11-11: qty 60, 30d supply, fill #1
  Filled 2021-12-27: qty 60, 30d supply, fill #2

## 2021-09-23 ENCOUNTER — Other Ambulatory Visit (HOSPITAL_COMMUNITY): Payer: Self-pay

## 2021-10-05 ENCOUNTER — Ambulatory Visit (HOSPITAL_COMMUNITY): Payer: No Typology Code available for payment source | Attending: Internal Medicine

## 2021-10-05 ENCOUNTER — Telehealth: Payer: Self-pay | Admitting: *Deleted

## 2021-10-05 DIAGNOSIS — I77819 Aortic ectasia, unspecified site: Secondary | ICD-10-CM | POA: Diagnosis present

## 2021-10-05 DIAGNOSIS — I2510 Atherosclerotic heart disease of native coronary artery without angina pectoris: Secondary | ICD-10-CM | POA: Insufficient documentation

## 2021-10-05 LAB — ECHOCARDIOGRAM COMPLETE
AR max vel: 2.6 cm2
AV Area VTI: 2.95 cm2
AV Area mean vel: 2.54 cm2
AV Mean grad: 8 mmHg
AV Peak grad: 14.9 mmHg
Ao pk vel: 1.93 m/s
Area-P 1/2: 2.73 cm2
S' Lateral: 2.2 cm

## 2021-10-05 MED ORDER — PERFLUTREN LIPID MICROSPHERE
1.0000 mL | INTRAVENOUS | Status: AC | PRN
  Administered 2021-10-05: 2 mL via INTRAVENOUS

## 2021-10-05 NOTE — Telephone Encounter (Signed)
-----   Message from Freada Bergeron, MD sent at 10/05/2021  2:31 PM EDT ----- ?His echo shows normal pumping function with no significant valve disease. His aorta size looks good on his current echo. Will check a CTA next year as he may not need further imaging going forward pending on what his CTA shows.   ?

## 2021-10-05 NOTE — Telephone Encounter (Signed)
The patient has been notified of the result and verbalized understanding.  All questions (if any) were answered. ? ?Pt aware I will go ahead and place the order for CT ANGIO CHEST AORTA to be done in one year and send a message to our CT Scheduler to call him back and arrange this appt.  ?Future BMET also placed for this time.  ?Pt verbalized understanding and agrees with this plan. ? ?

## 2021-10-16 ENCOUNTER — Other Ambulatory Visit (HOSPITAL_COMMUNITY): Payer: Self-pay

## 2021-10-16 ENCOUNTER — Encounter: Payer: Self-pay | Admitting: Family Medicine

## 2021-10-16 ENCOUNTER — Ambulatory Visit (INDEPENDENT_AMBULATORY_CARE_PROVIDER_SITE_OTHER): Payer: No Typology Code available for payment source | Admitting: Family Medicine

## 2021-10-16 VITALS — BP 126/70 | HR 77 | Temp 97.2°F | Ht 67.0 in | Wt 245.4 lb

## 2021-10-16 DIAGNOSIS — I10 Essential (primary) hypertension: Secondary | ICD-10-CM

## 2021-10-16 DIAGNOSIS — G4700 Insomnia, unspecified: Secondary | ICD-10-CM | POA: Diagnosis not present

## 2021-10-16 DIAGNOSIS — H6093 Unspecified otitis externa, bilateral: Secondary | ICD-10-CM

## 2021-10-16 DIAGNOSIS — E1169 Type 2 diabetes mellitus with other specified complication: Secondary | ICD-10-CM

## 2021-10-16 DIAGNOSIS — G4733 Obstructive sleep apnea (adult) (pediatric): Secondary | ICD-10-CM

## 2021-10-16 DIAGNOSIS — R519 Headache, unspecified: Secondary | ICD-10-CM | POA: Diagnosis not present

## 2021-10-16 DIAGNOSIS — F339 Major depressive disorder, recurrent, unspecified: Secondary | ICD-10-CM

## 2021-10-16 LAB — POCT GLYCOSYLATED HEMOGLOBIN (HGB A1C): Hemoglobin A1C: 5.6 % (ref 4.0–5.6)

## 2021-10-16 MED ORDER — ZOLPIDEM TARTRATE ER 6.25 MG PO TBCR
6.2500 mg | EXTENDED_RELEASE_TABLET | Freq: Every evening | ORAL | 2 refills | Status: DC | PRN
  Filled 2021-10-16: qty 30, 30d supply, fill #0
  Filled 2021-11-27: qty 30, 30d supply, fill #1

## 2021-10-16 MED ORDER — NEOMYCIN-POLYMYXIN-HC 3.5-10000-1 OT SOLN: 3.0000 [drp] | Freq: Four times a day (QID) | OTIC | 1 refills | Status: AC

## 2021-10-16 NOTE — Progress Notes (Signed)
?Adam Mcdonald ?DOB: Aug 15, 1965 ?Encounter date: 10/16/2021 ? ?This is a 56 y.o. male who presents with ?Chief Complaint  ?Patient presents with  ? Follow-up  ?  Diabetes, fasting.   ? ? ?History of present illness: ?Depression: mood feels pretty good on the wellbutrin, pristiq.  ?Diabetes: had been having some morning nausea, so changed breakfast to cream of wheat. He is pleasantly surprised by low A1C POC today: 5.6. we prescribed '2mg'$ , but he is taking '1mg'$ . He has quite a bit left. We were worried about him running out.  ? ?Right ear canal is sensitive, but no itching. Just wanted to recheck ears. Has been disposing of ear plugs daily after use at work.  ? ?Tinea versicolor treated last time with diflucan.  ? 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 Now having more headaches- constant pain behind eyes and wraping around base of neck. Has a little nausea with the headaches. Pain level at 3. Was once every 6 weeks, but someone was sick, so doing shifts more often now. Has tried melatonin but this didn't work. Had been on ambien in the past, but prefers not to restart this. Also did remeron in past, but worries about weight gain.tried doxepin as well - had severe dry mouth with this.  ? ?Still doing 11,000 steps daily at work at minimum. Achilles is feeling great.  ? ? ?Allergies  ?Allergen Reactions  ? Other   ?  Thimerosal (eyes only)  ? Trazodone And Nefazodone   ?  Dry mouth  ? ?Current Meds  ?Medication Sig  ? Aspirin-Acetaminophen-Caffeine (EXCEDRIN PO) Take by mouth.  ? zolpidem (AMBIEN CR) 6.25 MG CR tablet Take 1 tablet (6.25 mg total) by mouth at bedtime as needed for sleep.  ? ? ?Review of Systems  ?Constitutional:  Negative for chills, fatigue and fever.  ?Respiratory:  Negative for cough, chest tightness, shortness of breath and wheezing.   ?Cardiovascular:  Negative for chest pain, palpitations and leg swelling.  ?Neurological:   Positive for headaches (see hpi).  ? ?Objective: ? ?BP 126/70   Pulse 77   Temp (!) 97.2 ?F (36.2 ?C) (Oral)   Ht '5\' 7"'$  (1.702 m)   Wt 245 lb 7 oz (111.3 kg)   SpO2 99%   BMI 38.44 kg/m?   Weight: 245 lb 7 oz (111.3 kg)  ? ?BP Readings from Last 3 Encounters:  ?10/16/21 126/70  ?08/05/21 115/63  ?07/29/21 102/72  ? ?Wt Readings from Last 3 Encounters:  ?10/16/21 245 lb 7 oz (111.3 kg)  ?08/05/21 245 lb (111.1 kg)  ?07/29/21 251 lb 4.8 oz (114 kg)  ? ? ?Physical Exam ?Constitutional:   ?   General: He is not in acute distress. ?   Appearance: He is well-developed.  ?HENT:  ?   Right Ear: Tympanic membrane normal.  ?   Left Ear: Tympanic membrane normal.  ?   Ears:  ?   Comments: Bilat ear canals with cerumen, debris- white, erythema in ear canal.  ?Cardiovascular:  ?   Rate and Rhythm: Normal rate and regular rhythm.  ?   Heart sounds: Normal heart sounds. No murmur heard. ?  No friction rub.  ?Pulmonary:  ?   Effort: Pulmonary effort is normal. No respiratory distress.  ?   Breath sounds: Normal breath sounds. No wheezing or rales.  ?Musculoskeletal:  ?   Right lower leg: No edema.  ?  Left lower leg: No edema.  ?Neurological:  ?   Mental Status: He is alert and oriented to person, place, and time.  ?Psychiatric:     ?   Behavior: Behavior normal.  ? ? ?Assessment/Plan ? ?1. Controlled type 2 diabetes mellitus with other specified complication, without long-term current use of insulin ([place]) ?Blood sugar control is much better.  I am worried that some of his fatigue may be related to blood sugars being on the lower end.  We are going to cut back on his Ozempic (he still has the 2 mg pens, so we will discuss back to 20 clicks each week.) ?- POC HgB A1c ? ?2. Insomnia, unspecified type ?He has tried multiple sleep medications in the past and is trying to avoid anything that would cause weight gain.  He did the best with Ambien in the past, but prefers to limit use to just as needed.  We will use this to try to  help with his reset of sleep schedule after coming off graveyard shift.  He will let me know how this does. ?- zolpidem (AMBIEN CR) 6.25 MG CR tablet; Take 1 tablet (6.25 mg total) by mouth at bedtime as needed for sleep.  Dispense: 30 tablet; Refill: 2 ? ?3. Nonintractable headache, unspecified chronicity pattern, unspecified headache type ?Intermittent headache.  Seems related to lack of sleep when switching over from graveyard shift.  May also be coming from sugar levels.  He will update me on this in a couple of weeks. ? ?4. Primary hypertension ?Blood pressure controlled.  Continue current medications. ? ?5. Obstructive sleep apnea ?Continue with CPAP. ? ?6. Depression, recurrent ([place]) ?Mood is doing well on Wellbutrin and Pristiq.  Continue these. ? ?7. Otitis externa of both ears, unspecified chronicity, unspecified type ?Ears were flushed today in the office and then he was encouraged to use eardrops for both ears at least 3 weeks.  If sensitivity of ear canal is not better at that time, I would consider referral to ENT.  Encouraged him to stop using Q-tips.  When he has to wear hearing protection at work, he will dispose of earplugs and only is wearing them for combined time of less than 1 hour a day. ?- neomycin-polymyxin-hydrocortisone (CORTISPORIN) OTIC solution; Place 3 drops into both ears 4 (four) times daily.  Dispense: 10 mL; Refill: 1 ? ? ?Return in about 6 months (around 04/18/2022) for Chronic condition visit. ? ? ? ? ? ?Micheline Rough, MD ?

## 2021-10-16 NOTE — Patient Instructions (Signed)
*  decrease ozempic to 20 clicks of the '2mg'$  pen weekly.  ? 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 If this is not working, let me know. If one tablet doesn't work, you could take 2 at a time the following night.  ? ?

## 2021-10-27 ENCOUNTER — Encounter: Payer: Self-pay | Admitting: Family Medicine

## 2021-10-30 ENCOUNTER — Other Ambulatory Visit: Payer: Self-pay | Admitting: Family Medicine

## 2021-10-30 ENCOUNTER — Other Ambulatory Visit (HOSPITAL_COMMUNITY): Payer: Self-pay

## 2021-10-30 MED ORDER — RAMELTEON 8 MG PO TABS
8.0000 mg | ORAL_TABLET | Freq: Every day | ORAL | 5 refills | Status: DC
  Filled 2021-10-30 – 2021-12-27 (×2): qty 30, 30d supply, fill #0
  Filled 2022-01-23: qty 30, 30d supply, fill #1
  Filled 2022-03-01: qty 30, 30d supply, fill #2
  Filled 2022-04-12: qty 30, 30d supply, fill #3
  Filled 2022-05-20: qty 30, 30d supply, fill #4

## 2021-10-30 NOTE — Telephone Encounter (Signed)
I don't think that there are other preferred sleep meds at this time that are covered. I had already advised patient to try doubling up on the ambien that he has, so please just let him know that if this doesn't work he should follow up with new provider. And if it does work, then he will need a new dose of the ambien CR sent in (12.'5mg'$ ). thanks!

## 2021-11-04 IMAGING — DX DG CHEST 1V
1 series · 1 of 1 positions shown · non-contrast
Comparison: None.

CLINICAL DATA: Cough, chills, and shortness of breath.

EXAM:
CHEST  1 VIEW

[chest ap]
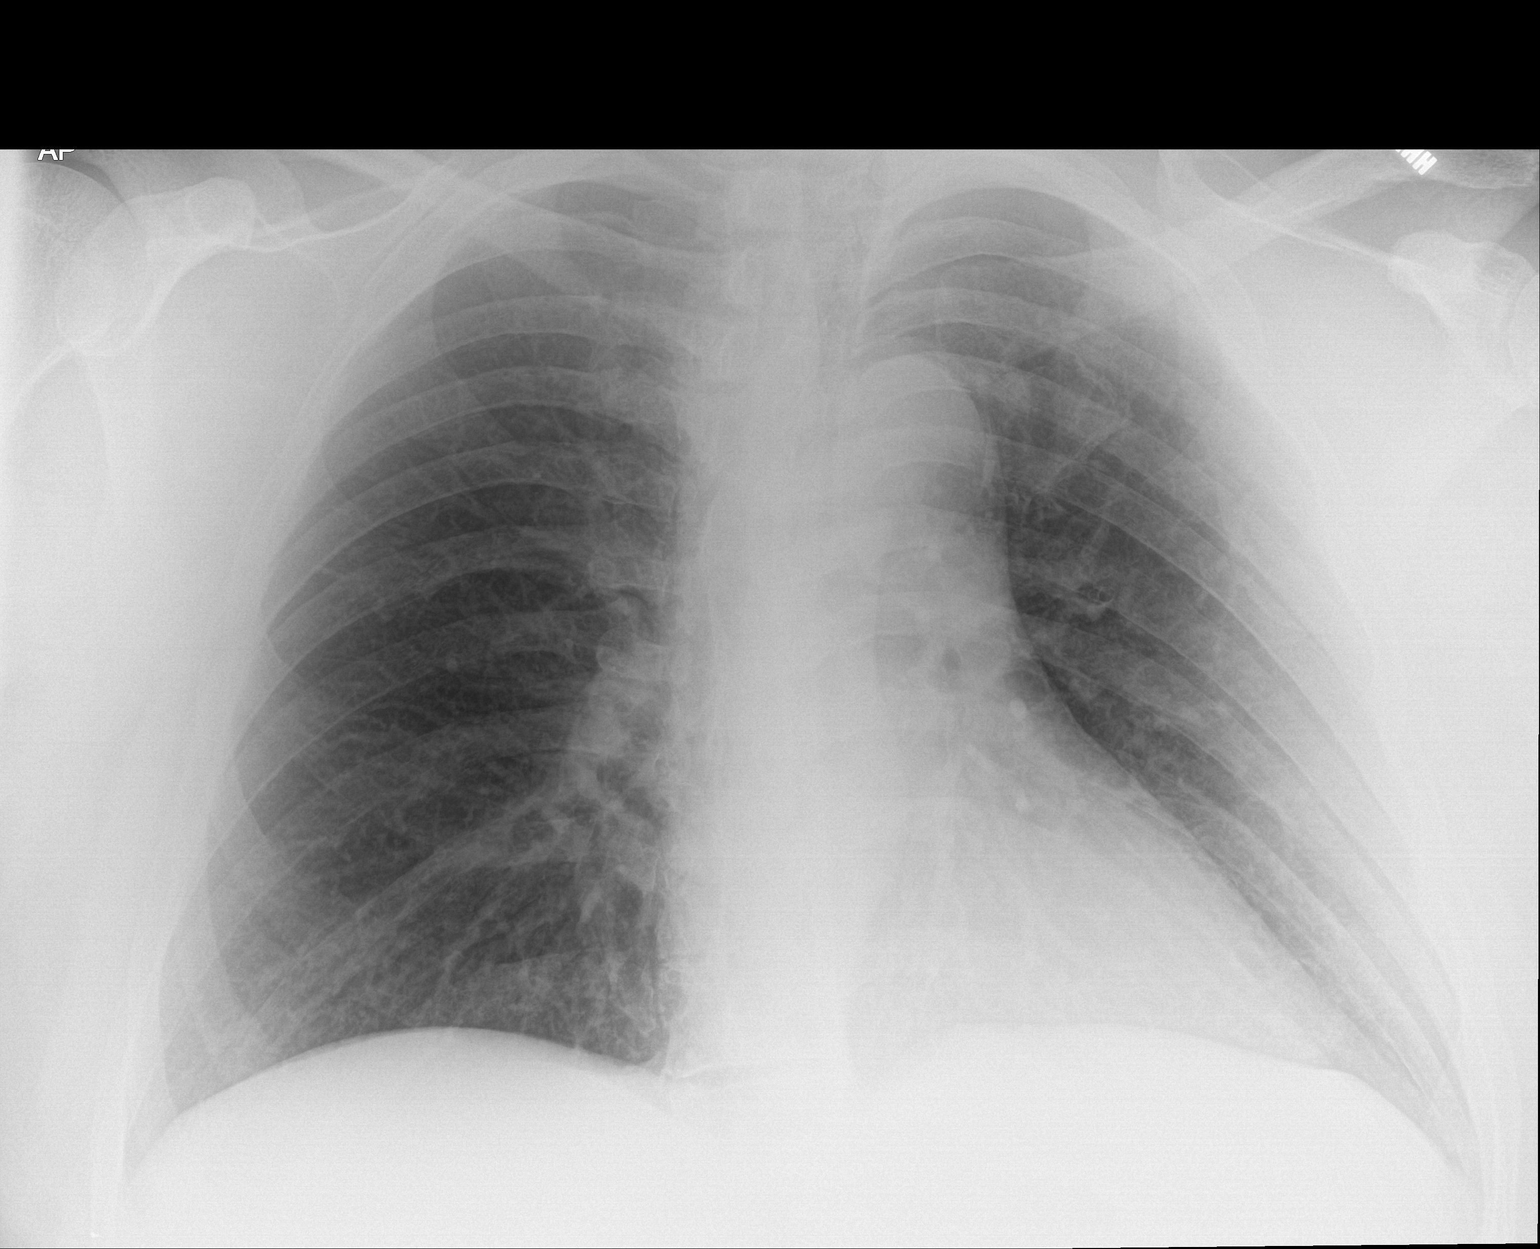

[1 of 1 positions shown; findings below may reference images not displayed]

FINDINGS: The heart size and mediastinal contours are within normal limits.
Both lungs are clear. The visualized skeletal structures are
unremarkable.
IMPRESSION: No active disease.

## 2021-11-05 ENCOUNTER — Other Ambulatory Visit (HOSPITAL_COMMUNITY): Payer: Self-pay

## 2021-11-11 ENCOUNTER — Other Ambulatory Visit: Payer: Self-pay | Admitting: Family Medicine

## 2021-11-11 ENCOUNTER — Other Ambulatory Visit (HOSPITAL_COMMUNITY): Payer: Self-pay

## 2021-11-11 DIAGNOSIS — F321 Major depressive disorder, single episode, moderate: Secondary | ICD-10-CM

## 2021-11-12 ENCOUNTER — Other Ambulatory Visit (HOSPITAL_COMMUNITY): Payer: Self-pay

## 2021-11-12 MED ORDER — DESVENLAFAXINE SUCCINATE ER 50 MG PO TB24
50.0000 mg | ORAL_TABLET | Freq: Every day | ORAL | 1 refills | Status: DC
  Filled 2021-11-12: qty 90, 90d supply, fill #0
  Filled 2022-02-17: qty 90, 90d supply, fill #1

## 2021-11-12 MED ORDER — BUPROPION HCL ER (XL) 300 MG PO TB24
300.0000 mg | ORAL_TABLET | Freq: Every day | ORAL | 1 refills | Status: DC
  Filled 2021-11-12: qty 90, 90d supply, fill #0
  Filled 2022-02-17: qty 90, 90d supply, fill #1

## 2021-11-12 MED ORDER — LOSARTAN POTASSIUM-HCTZ 100-25 MG PO TABS
1.0000 | ORAL_TABLET | Freq: Every day | ORAL | 1 refills | Status: DC
Start: 2021-11-12 — End: 2022-05-15
  Filled 2021-11-12: qty 90, 90d supply, fill #0
  Filled 2022-02-17: qty 90, 90d supply, fill #1

## 2021-11-27 ENCOUNTER — Other Ambulatory Visit (HOSPITAL_COMMUNITY): Payer: Self-pay

## 2021-12-01 ENCOUNTER — Other Ambulatory Visit (HOSPITAL_COMMUNITY): Payer: Self-pay

## 2021-12-01 ENCOUNTER — Encounter (HOSPITAL_COMMUNITY): Payer: Self-pay | Admitting: Pharmacist

## 2021-12-28 ENCOUNTER — Other Ambulatory Visit (HOSPITAL_COMMUNITY): Payer: Self-pay

## 2021-12-29 ENCOUNTER — Other Ambulatory Visit (HOSPITAL_COMMUNITY): Payer: Self-pay

## 2021-12-30 ENCOUNTER — Other Ambulatory Visit (HOSPITAL_COMMUNITY): Payer: Self-pay

## 2021-12-31 ENCOUNTER — Other Ambulatory Visit (HOSPITAL_COMMUNITY): Payer: Self-pay

## 2022-01-17 ENCOUNTER — Encounter (HOSPITAL_COMMUNITY): Payer: Self-pay

## 2022-01-17 ENCOUNTER — Other Ambulatory Visit: Payer: Self-pay

## 2022-01-17 ENCOUNTER — Emergency Department (HOSPITAL_COMMUNITY)
Admission: EM | Admit: 2022-01-17 | Discharge: 2022-01-17 | Disposition: A | Discharge status: Home / Self Care | Payer: PRIVATE HEALTH INSURANCE | Attending: Emergency Medicine | Admitting: Emergency Medicine

## 2022-01-17 DIAGNOSIS — S59902A Unspecified injury of left elbow, initial encounter: Secondary | ICD-10-CM | POA: Diagnosis present

## 2022-01-17 DIAGNOSIS — Y990 Civilian activity done for income or pay: Secondary | ICD-10-CM | POA: Insufficient documentation

## 2022-01-17 DIAGNOSIS — S51012A Laceration without foreign body of left elbow, initial encounter: Secondary | ICD-10-CM | POA: Insufficient documentation

## 2022-01-17 DIAGNOSIS — W11XXXA Fall on and from ladder, initial encounter: Secondary | ICD-10-CM | POA: Insufficient documentation

## 2022-01-17 DIAGNOSIS — W19XXXA Unspecified fall, initial encounter: Secondary | ICD-10-CM

## 2022-01-17 MED ORDER — LIDOCAINE HCL (PF) 1 % IJ SOLN
30.0000 mL | Freq: Once | INTRAMUSCULAR | Status: AC
  Administered 2022-01-17: 30 mL via INTRADERMAL
  Filled 2022-01-17: qty 30

## 2022-01-17 MED ORDER — ACETAMINOPHEN 500 MG PO TABS
1000.0000 mg | ORAL_TABLET | Freq: Once | ORAL | Status: AC
Start: 2022-01-17 — End: 2022-01-17
  Administered 2022-01-17: 1000 mg via ORAL
  Filled 2022-01-17: qty 2

## 2022-01-17 NOTE — Discharge Instructions (Signed)
Keep sutures clean and dry. Follow-up with your primary care doctor or urgent care to have sutures removed. Return here for new concerns.

## 2022-01-17 NOTE — ED Provider Notes (Signed)
[facility] DEPT Provider Note   CSN: [id number] Arrival date & time: 01/17/22  0403     History  Chief Complaint  Patient presents with   Adam Mcdonald    Adam Mcdonald is a 56 y.o. male.  The history is provided by the patient and medical records.  Fall   56 y.o. M here with laceration to left elbow.  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  Denies head injury or LOC.  States his back feels sore but no specific pain.  Laceration to left elbow.  Thinks tetanus is UTD (chart review-- given 2021).  Home Medications Prior to Admission medications   Medication Sig Start Date End Date Taking? Authorizing Provider  Acetaminophen (TYLENOL PO) Take by mouth as needed.    [provider]  Aspirin-Acetaminophen-Caffeine (EXCEDRIN PO) Take by mouth.    [provider]  buPROPion (WELLBUTRIN XL) 300 MG 24 hr tablet Take 1 tablet (300 mg total) by mouth daily. 11/12/21   Dutch Quint B, FNP  celecoxib (CELEBREX) 200 MG capsule Take 1 capsule by mouth 2 times daily as needed. 09/22/21   Koberlein, Steele Berg, MD  desvenlafaxine (PRISTIQ) 50 MG 24 hr tablet Take 1 tablet by mouth daily. 11/12/21   Kennyth Arnold, FNP  fluconazole (DIFLUCAN) 150 MG tablet Take 1 tablet by mouth weekly 07/29/21   Koberlein, Steele Berg, MD  losartan-hydrochlorothiazide (HYZAAR) 100-25 MG tablet Take 1 tablet by mouth daily. 11/12/21   Kennyth Arnold, FNP  Multiple Vitamin (MULTI-VITAMIN DAILY PO) Take by mouth.    [provider]  Omega 3 1200 MG CAPS Take by mouth.    [provider]  ondansetron (ZOFRAN-ODT) 4 MG disintegrating tablet Take 1 tablet (4 mg total) by mouth every 8 (eight) hours as needed. 08/05/21   Long, Wonda Olds, MD  ramelteon (ROZEREM) 8 MG tablet Take 1 tablet (8 mg total) by mouth at bedtime. 10/30/21   Caren Macadam, MD  rosuvastatin (CRESTOR) 10 MG tablet Take 1  tablet (10 mg total) by mouth daily. 05/08/21   Freada Bergeron, MD  Semaglutide, 1 MG/DOSE, 4 MG/3ML SOPN Inject 1 mg as directed once a week. 06/10/21   Koberlein, Steele Berg, MD  Semaglutide, 2 MG/DOSE, 8 MG/3ML SOPN Inject 2 mg as directed once a week. 07/29/21   Caren Macadam, MD  VITAMIN D PO Take 5,000 Units by mouth daily.    [provider]  zolpidem (AMBIEN CR) 6.25 MG CR tablet Take 1 tablet (6.25 mg total) by mouth at bedtime as needed for sleep. 10/16/21   Caren Macadam, MD      Allergies    Other and Trazodone and nefazodone    Review of Systems   Review of Systems  Skin:  Positive for wound.  All other systems reviewed and are negative.   Physical Exam Updated Vital Signs BP (!) 152/88   Pulse 94   Temp 97.6 F (36.4 C) (Oral)   Resp 16   Ht '5\' 7"'$  (1.702 m)   Wt 108.9 kg   SpO2 97%   BMI 37.59 kg/m   Physical Exam Vitals and nursing note reviewed.  Constitutional:      Appearance: He is well-developed.  HENT:     Head: Normocephalic and atraumatic.  Eyes:     Conjunctiva/sclera: Conjunctivae normal.     Pupils: Pupils are equal, round, and  reactive to light.  Cardiovascular:     Rate and Rhythm: Normal rate and regular rhythm.     Heart sounds: Normal heart sounds.  Pulmonary:     Effort: Pulmonary effort is normal. No respiratory distress.     Breath sounds: Normal breath sounds. No rhonchi.  Musculoskeletal:        General: Normal range of motion.     Cervical back: Normal range of motion.     Comments: 3inch laceration to left elbow, appears jagged with irregular margins, there is a flap type area to the wound but tissue remains vascularized, able to flex/extend without issue  Skin:    General: Skin is warm and dry.  Neurological:     Mental Status: He is alert and oriented to person, place, and time.     ED Results / Procedures / Treatments   Labs (all labs ordered are listed, but only abnormal results are displayed) Labs  Reviewed - No data to display  EKG None  Radiology No results found.  Procedures Procedures    LACERATION REPAIR Performed by: Larene Pickett Authorized by: Larene Pickett Consent: Verbal consent obtained. Risks and benefits: risks, benefits and alternatives were discussed Consent given by: patient Patient identity confirmed: provided demographic data Prepped and Draped in normal sterile fashion Wound explored  Laceration Location: left elbow  Laceration Length: 3inch  No Foreign Bodies seen or palpated  Anesthesia: local infiltration  Local anesthetic: lidocaine 1% without epinephrine  Anesthetic total: 5 ml  Irrigation method: syringe Amount of cleaning: standard  Skin closure: 4-0 Ethilon  Number of sutures: 9  Technique: simple interrupted  Patient tolerance: Patient tolerated the procedure well with no immediate complications.   Medications Ordered in ED Medications  lidocaine (PF) (XYLOCAINE) 1 % injection 30 mL (30 mLs Intradermal Given 01/17/22 0547)  acetaminophen (TYLENOL) tablet 1,000 mg (1,000 mg Oral Given 01/17/22 0547)    ED Course/ Medical Decision Making/ A&P                           Medical Decision Making Risk OTC drugs. Prescription drug management.   56 year old male presenting to the ED with left elbow laceration after he slipped off a ladder at work.  Denies any head injury or loss of consciousness.  Has jagged and irregular laceration to left elbow.  Small central flap but tissue appears to still be vascularized.  Able to flex and extend without issue.  His tetanus is up-to-date.  Laceration repaired as above, tolerated well.  Discussed home wound care instructions.  Follow-up with PCP or urgent care for suture removal in 7 to 10 days.  Can return here for any new or acute changes.  Final Clinical Impression(s) / ED Diagnoses Final diagnoses:  Fall, initial encounter  Laceration of left elbow, initial encounter    Rx / DC  Orders ED Discharge Orders     None         Larene Pickett, PA-C 01/17/22 3235    Quintella Reichert, MD 01/17/22 [phone]

## 2022-01-17 NOTE — ED Triage Notes (Addendum)
Fall at work tonight when he missed the bottom step of ladder.   Lower back pain and ~5cm x1cm skin tear on left elbow. Bleeding controlled.   Suspects tdap is up to date.

## 2022-01-20 ENCOUNTER — Other Ambulatory Visit (HOSPITAL_COMMUNITY): Payer: Self-pay

## 2022-01-20 ENCOUNTER — Ambulatory Visit (INDEPENDENT_AMBULATORY_CARE_PROVIDER_SITE_OTHER): Payer: No Typology Code available for payment source | Admitting: Family Medicine

## 2022-01-20 ENCOUNTER — Encounter: Payer: Self-pay | Admitting: Family Medicine

## 2022-01-20 VITALS — BP 104/60 | HR 76 | Temp 97.8°F | Ht 67.0 in | Wt 244.4 lb

## 2022-01-20 DIAGNOSIS — B351 Tinea unguium: Secondary | ICD-10-CM | POA: Diagnosis not present

## 2022-01-20 DIAGNOSIS — E1169 Type 2 diabetes mellitus with other specified complication: Secondary | ICD-10-CM

## 2022-01-20 DIAGNOSIS — M545 Low back pain, unspecified: Secondary | ICD-10-CM

## 2022-01-20 DIAGNOSIS — G8929 Other chronic pain: Secondary | ICD-10-CM | POA: Diagnosis not present

## 2022-01-20 LAB — POCT GLYCOSYLATED HEMOGLOBIN (HGB A1C): Hemoglobin A1C: 5.7 % — AB (ref 4.0–5.6)

## 2022-01-20 MED ORDER — MELOXICAM 15 MG PO TABS
15.0000 mg | ORAL_TABLET | Freq: Every day | ORAL | 1 refills | Status: DC
  Filled 2022-01-20: qty 90, 90d supply, fill #0
  Filled 2022-04-12: qty 90, 90d supply, fill #1

## 2022-01-20 MED ORDER — SEMAGLUTIDE (2 MG/DOSE) 8 MG/3ML ~~LOC~~ SOPN: 2.0000 mg | PEN_INJECTOR | SUBCUTANEOUS | 1 refills | Status: DC

## 2022-01-20 MED ORDER — CICLOPIROX 8 % EX SOLN
Freq: Every day | CUTANEOUS | 0 refills | Status: DC
  Filled 2022-01-20: qty 6.6, 30d supply, fill #0

## 2022-01-20 MED ORDER — SEMAGLUTIDE (2 MG/DOSE) 8 MG/3ML ~~LOC~~ SOPN
2.0000 mg | PEN_INJECTOR | SUBCUTANEOUS | 1 refills | Status: DC
  Filled 2022-01-20 – 2022-01-22 (×2): qty 9, 84d supply, fill #0

## 2022-01-20 NOTE — Assessment & Plan Note (Signed)
Very well controlled, pt's weight and A1C is stable currently, I advised we try increasing to 2 mg weekly on the ozempic for improved glucose and weight control, pt is agreeable

## 2022-01-20 NOTE — Patient Instructions (Signed)
Follow up in 6 months-- will be getting your annual bloodwork at that visit-- please be fasting  Try the Debrox ear drops 2 times a week to help with ear wax.

## 2022-01-20 NOTE — Assessment & Plan Note (Signed)
celebrex no longer effective, will switch back to meloxicam daily.

## 2022-01-20 NOTE — Progress Notes (Signed)
Established Patient Office Visit  Subjective   Patient ID: Adam Mcdonald, male    DOB: [birth year]  Age: 56 y.o. MRN: [id number]  Chief Complaint  Patient presents with   Establish Care    Patient is here for follow up and transition of care. 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 States he works days M-F then has to work an overnight shift over the weekend. States he has a lot of trouble resetting his sleep schedule. States the [place] did not work for him and made him feel "hung over', states that the rozerem is working a bit better with less side effects.   DM-- pt's A1C today is stable from previous, states he is tolerating the ozempic well, no side effects reported. We discussed increasing it to 2 mg weekly and he is agreeable. Pt is due for his foot exam today  Chronic low back pain- pt reports the celebrex isn't working as well as when he first started it, was wondering if he could go back to the meloxicam once daily instead.      Patient Active Problem List   Diagnosis Date Noted   Diabetes mellitus type II, controlled ([place]) 12/15/2020   Obstructive sleep apnea 11/24/2020   Hypertension 08/31/2019   Low back pain 06/23/2018   Depression, recurrent ([place]) 06/02/2018   Vitamin D deficiency 06/02/2018      Review of Systems  All other systems reviewed and are negative.     Objective:     BP 104/60 (BP Location: Right Arm, Patient Position: Sitting, Cuff Size: Large)   Pulse 76   Temp 97.8 F (36.6 C) (Oral)   Ht '5\' 7"'$  (1.702 m)   Wt 244 lb 6.4 oz (110.9 kg)   SpO2 98%   BMI 38.28 kg/m  Wt Readings from Last 3 Encounters:  01/20/22 244 lb 6.4 oz (110.9 kg)  01/17/22 240 lb (108.9 kg)  10/16/21 245 lb 7 oz (111.3 kg)      Physical Exam Constitutional:      Appearance: Normal appearance. He is obese.  HENT:     Head: Normocephalic and atraumatic.     Right Ear: Tympanic membrane normal.      Left Ear: Tympanic membrane normal.  Eyes:     Extraocular Movements: Extraocular movements intact.     Pupils: Pupils are equal, round, and reactive to light.  Cardiovascular:     Rate and Rhythm: Normal rate and regular rhythm.     Pulses: Normal pulses.          Dorsalis pedis pulses are 2+ on the right side and 2+ on the left side.  Pulmonary:     Effort: Pulmonary effort is normal.     Breath sounds: Normal breath sounds.  Musculoskeletal:     Right lower leg: No edema.     Left lower leg: No edema.  Feet:     Right foot:     Protective Sensation: 3 sites tested.  3 sites sensed.     Left foot:     Protective Sensation: 3 sites tested.  3 sites sensed.  Neurological:     Mental Status: He is alert and oriented to person, place, and time. Mental status is at baseline.      Results for orders placed or performed in visit on 01/20/22  POC HgB A1c  Result Value Ref Range   Hemoglobin A1C 5.7 (A)  4.0 - 5.6 %   HbA1c POC (<> result, manual entry)     HbA1c, POC (prediabetic range)     HbA1c, POC (controlled diabetic range)         The ASCVD Risk score (Arnett DK, et al., 2019) failed to calculate for the following reasons:   The valid total cholesterol range is 130 to 320 mg/dL    Assessment & Plan:   Problem List Items Addressed This Visit       Endocrine   Diabetes mellitus type II, controlled ([place]) - Primary   Relevant Medications   Semaglutide, 2 MG/DOSE, 8 MG/3ML SOPN   Other Relevant Orders   POC HgB A1c (Completed)     Other   Low back pain   Relevant Medications   meloxicam (MOBIC) 15 MG tablet   Other Visit Diagnoses     Onychomycosis of great toe       Relevant Medications   ciclopirox (PENLAC) 8 % solution  Seen on foot exam today, will order ciclopirox for the fungal infection.     Return in about 6 months (around 07/23/2022) for follow up DM and HTN.    Farrel Conners, MD

## 2022-01-21 ENCOUNTER — Ambulatory Visit (HOSPITAL_COMMUNITY)
Admission: RE | Admit: 2022-01-21 | Discharge: 2022-01-21 | Disposition: A | Discharge status: Home / Self Care | Payer: PRIVATE HEALTH INSURANCE | Source: Ambulatory Visit | Attending: Family Medicine | Admitting: Family Medicine

## 2022-01-21 ENCOUNTER — Other Ambulatory Visit (HOSPITAL_COMMUNITY): Payer: Self-pay | Admitting: Family Medicine

## 2022-01-21 ENCOUNTER — Other Ambulatory Visit (HOSPITAL_COMMUNITY): Payer: Self-pay

## 2022-01-21 DIAGNOSIS — M5136 Other intervertebral disc degeneration, lumbar region: Secondary | ICD-10-CM | POA: Insufficient documentation

## 2022-01-21 DIAGNOSIS — M4316 Spondylolisthesis, lumbar region: Secondary | ICD-10-CM | POA: Diagnosis present

## 2022-01-21 DIAGNOSIS — Y990 Civilian activity done for income or pay: Secondary | ICD-10-CM

## 2022-01-21 DIAGNOSIS — W11XXXA Fall on and from ladder, initial encounter: Secondary | ICD-10-CM | POA: Diagnosis not present

## 2022-01-22 ENCOUNTER — Other Ambulatory Visit (HOSPITAL_COMMUNITY): Payer: Self-pay

## 2022-01-22 ENCOUNTER — Encounter: Payer: Self-pay | Admitting: Family Medicine

## 2022-01-22 DIAGNOSIS — E1169 Type 2 diabetes mellitus with other specified complication: Secondary | ICD-10-CM

## 2022-01-28 ENCOUNTER — Other Ambulatory Visit (HOSPITAL_COMMUNITY): Payer: Self-pay

## 2022-02-01 ENCOUNTER — Other Ambulatory Visit (HOSPITAL_COMMUNITY): Payer: Self-pay

## 2022-02-05 ENCOUNTER — Other Ambulatory Visit (HOSPITAL_COMMUNITY): Payer: Self-pay

## 2022-02-05 MED ORDER — METFORMIN HCL 500 MG PO TABS
500.0000 mg | ORAL_TABLET | Freq: Two times a day (BID) | ORAL | 1 refills | Status: DC
  Filled 2022-02-05: qty 180, 90d supply, fill #0
  Filled 2022-05-06: qty 180, 90d supply, fill #1

## 2022-02-07 ENCOUNTER — Other Ambulatory Visit: Payer: Self-pay

## 2022-02-09 ENCOUNTER — Other Ambulatory Visit: Payer: Self-pay

## 2022-02-09 ENCOUNTER — Other Ambulatory Visit (HOSPITAL_COMMUNITY): Payer: Self-pay

## 2022-02-09 MED ORDER — ROSUVASTATIN CALCIUM 10 MG PO TABS
10.0000 mg | ORAL_TABLET | Freq: Every day | ORAL | 2 refills | Status: DC
  Filled 2022-02-09: qty 30, 30d supply, fill #0
  Filled 2022-03-23: qty 30, 30d supply, fill #1
  Filled 2022-04-12 – 2022-04-22 (×2): qty 30, 30d supply, fill #2

## 2022-02-10 ENCOUNTER — Other Ambulatory Visit (HOSPITAL_COMMUNITY): Payer: Self-pay

## 2022-02-17 ENCOUNTER — Other Ambulatory Visit (HOSPITAL_COMMUNITY): Payer: Self-pay

## 2022-03-02 ENCOUNTER — Other Ambulatory Visit (HOSPITAL_COMMUNITY): Payer: Self-pay

## 2022-03-03 ENCOUNTER — Other Ambulatory Visit (HOSPITAL_COMMUNITY): Payer: Self-pay

## 2022-03-23 ENCOUNTER — Other Ambulatory Visit (HOSPITAL_COMMUNITY): Payer: Self-pay

## 2022-04-12 ENCOUNTER — Other Ambulatory Visit (HOSPITAL_COMMUNITY): Payer: Self-pay

## 2022-04-15 ENCOUNTER — Other Ambulatory Visit (HOSPITAL_COMMUNITY): Payer: Self-pay

## 2022-04-16 ENCOUNTER — Other Ambulatory Visit (HOSPITAL_COMMUNITY): Payer: Self-pay

## 2022-04-19 ENCOUNTER — Other Ambulatory Visit (HOSPITAL_COMMUNITY): Payer: Self-pay

## 2022-04-19 ENCOUNTER — Other Ambulatory Visit (HOSPITAL_BASED_OUTPATIENT_CLINIC_OR_DEPARTMENT_OTHER): Payer: Self-pay

## 2022-04-19 MED ORDER — ZYLET 0.5-0.3 % OP SUSP
OPHTHALMIC | 0 refills | Status: AC
  Filled 2022-04-19 (×2): qty 5, 33d supply, fill #0

## 2022-04-20 ENCOUNTER — Other Ambulatory Visit (HOSPITAL_COMMUNITY): Payer: Self-pay

## 2022-04-22 ENCOUNTER — Other Ambulatory Visit (HOSPITAL_COMMUNITY): Payer: Self-pay

## 2022-05-07 ENCOUNTER — Other Ambulatory Visit (HOSPITAL_COMMUNITY): Payer: Self-pay

## 2022-05-15 ENCOUNTER — Other Ambulatory Visit: Payer: Self-pay

## 2022-05-15 ENCOUNTER — Other Ambulatory Visit: Payer: Self-pay | Admitting: Family

## 2022-05-15 DIAGNOSIS — F321 Major depressive disorder, single episode, moderate: Secondary | ICD-10-CM

## 2022-05-17 ENCOUNTER — Other Ambulatory Visit (HOSPITAL_COMMUNITY): Payer: Self-pay

## 2022-05-17 ENCOUNTER — Other Ambulatory Visit: Payer: Self-pay

## 2022-05-17 MED ORDER — ROSUVASTATIN CALCIUM 10 MG PO TABS
10.0000 mg | ORAL_TABLET | Freq: Every day | ORAL | 0 refills | Status: DC
  Filled 2022-05-17: qty 15, 15d supply, fill #0

## 2022-05-24 ENCOUNTER — Other Ambulatory Visit: Payer: Self-pay | Admitting: Family Medicine

## 2022-05-24 ENCOUNTER — Encounter: Payer: Self-pay | Admitting: Family Medicine

## 2022-05-24 ENCOUNTER — Other Ambulatory Visit (HOSPITAL_COMMUNITY): Payer: Self-pay

## 2022-05-24 DIAGNOSIS — F321 Major depressive disorder, single episode, moderate: Secondary | ICD-10-CM

## 2022-05-24 MED ORDER — BUPROPION HCL ER (XL) 300 MG PO TB24
300.0000 mg | ORAL_TABLET | Freq: Every day | ORAL | 1 refills | Status: DC
  Filled 2022-05-24: qty 90, 90d supply, fill #0
  Filled 2022-08-18: qty 90, 90d supply, fill #1

## 2022-05-24 MED ORDER — DESVENLAFAXINE SUCCINATE ER 50 MG PO TB24
50.0000 mg | ORAL_TABLET | Freq: Every day | ORAL | 1 refills | Status: DC
  Filled 2022-05-24: qty 90, 90d supply, fill #0
  Filled 2022-08-18: qty 90, 90d supply, fill #1

## 2022-05-24 MED ORDER — LOSARTAN POTASSIUM-HCTZ 100-25 MG PO TABS
1.0000 | ORAL_TABLET | Freq: Every day | ORAL | 1 refills | Status: DC
  Filled 2022-05-24: qty 90, 90d supply, fill #0
  Filled 2022-08-18: qty 90, 90d supply, fill #1

## 2022-05-25 ENCOUNTER — Ambulatory Visit (INDEPENDENT_AMBULATORY_CARE_PROVIDER_SITE_OTHER): Payer: No Typology Code available for payment source | Admitting: Family Medicine

## 2022-05-25 ENCOUNTER — Other Ambulatory Visit (HOSPITAL_COMMUNITY): Payer: Self-pay

## 2022-05-25 ENCOUNTER — Encounter: Payer: Self-pay | Admitting: Family Medicine

## 2022-05-25 VITALS — BP 140/78 | HR 90 | Temp 98.0°F | Ht 67.0 in | Wt 260.3 lb

## 2022-05-25 DIAGNOSIS — I10 Essential (primary) hypertension: Secondary | ICD-10-CM

## 2022-05-25 DIAGNOSIS — G4700 Insomnia, unspecified: Secondary | ICD-10-CM | POA: Diagnosis not present

## 2022-05-25 DIAGNOSIS — E1169 Type 2 diabetes mellitus with other specified complication: Secondary | ICD-10-CM | POA: Diagnosis not present

## 2022-05-25 LAB — POCT GLYCOSYLATED HEMOGLOBIN (HGB A1C): Hemoglobin A1C: 6.3 % — AB (ref 4.0–5.6)

## 2022-05-25 MED ORDER — AMITRIPTYLINE HCL 25 MG PO TABS
25.0000 mg | ORAL_TABLET | Freq: Every day | ORAL | 0 refills | Status: DC
  Filled 2022-05-25: qty 90, 90d supply, fill #0

## 2022-05-25 MED ORDER — AMLODIPINE BESYLATE 2.5 MG PO TABS
2.5000 mg | ORAL_TABLET | Freq: Every day | ORAL | 1 refills | Status: DC
  Filled 2022-05-25: qty 90, 90d supply, fill #0
  Filled 2022-08-18: qty 90, 90d supply, fill #1

## 2022-05-25 MED ORDER — ROSUVASTATIN CALCIUM 10 MG PO TABS
10.0000 mg | ORAL_TABLET | Freq: Every day | ORAL | 3 refills | Status: DC
  Filled 2022-05-25 – 2022-05-29 (×2): qty 90, 90d supply, fill #0
  Filled 2022-09-01: qty 90, 90d supply, fill #1
  Filled 2022-11-30: qty 90, 90d supply, fill #2
  Filled 2023-02-27: qty 90, 90d supply, fill #3

## 2022-05-25 MED ORDER — TIRZEPATIDE 2.5 MG/0.5ML ~~LOC~~ SOAJ
2.5000 mg | SUBCUTANEOUS | 5 refills | Status: DC
  Filled 2022-05-25: qty 2, 28d supply, fill #0
  Filled 2022-06-20: qty 2, 28d supply, fill #1
  Filled 2022-07-18: qty 2, 28d supply, fill #2
  Filled 2022-08-15: qty 2, 28d supply, fill #3
  Filled 2022-09-11: qty 2, 28d supply, fill #4

## 2022-05-25 NOTE — Progress Notes (Unsigned)
Established Patient Office Visit  Subjective   Patient ID: Adam Mcdonald, male    DOB: 1965/10/06  Age: 56 y.o. MRN: [id number]  Chief Complaint  Patient presents with   Establish Care   Medication Refill    Patient requests PCP prescribe a 90-day supply of Rosuvastatin instead of the cardiologist    Patient is here for follow up on his insomnia and his diabetes.   DM-- pt has been getting sugar cravings while on the metformin, states that it is making him "feel funny" and he is improving this feeling by drinking a sugary drink. A1C is acutally worse today. Pt was unable to get the GLP medication last visit due to insurance which required him to try metformin first.   Insomnia-- pt reports that the rozerem was not covered but he picked it up and has been using it, not really helping with the insomnia, states that he is still having trouble falling asleep at night. We discussed other options for treatment.   HTN -- BP is elevated today in office. Pt reports compliance with his medications. Denies chest pain or SOB. We discussed other medications for treatment.    Current Outpatient Medications  Medication Instructions   Acetaminophen (TYLENOL PO) Oral, As needed   amitriptyline (ELAVIL) 25 MG tablet Take 1 tablet (25 mg total) by mouth at bedtime. Stop rozerem.   amLODipine (NORVASC) 2.5 mg, Oral, Daily   Aspirin-Acetaminophen-Caffeine (EXCEDRIN PO) Oral   buPROPion (WELLBUTRIN XL) 300 mg, Oral, Daily   ciclopirox (PENLAC) 8 % solution Topical, Daily at bedtime, Apply over nail and surrounding skin. Apply daily over previous coat. After seven (7) days, may remove with alcohol and continue cycle.   desvenlafaxine (PRISTIQ) 50 MG 24 hr tablet Take 1 tablet by mouth daily.   diphenhydrAMINE-APAP, sleep, (TYLENOL PM EXTRA STRENGTH PO) Oral, As needed   losartan-hydrochlorothiazide (HYZAAR) 100-25 MG tablet 1 tablet, Oral, Daily   Loteprednol-Tobramycin (ZYLET) 0.5-0.3 % SUSP Instill 1  drop into left eye three times a day   meloxicam (MOBIC) 15 mg, Oral, Daily   metFORMIN (GLUCOPHAGE) 500 mg, Oral, 2 times daily with meals   Multiple Vitamin (MULTI-VITAMIN DAILY PO) Oral   Omega 3 1200 MG CAPS Oral   ramelteon (ROZEREM) 8 mg, Oral, Daily at bedtime   rosuvastatin (CRESTOR) 10 mg, Oral, Daily   tirzepatide (MOUNJARO) 2.5 mg, Subcutaneous, Weekly   VITAMIN D PO 5,000 Units, Oral, Daily    Patient Active Problem List   Diagnosis Date Noted   Diabetes mellitus type II, controlled ([place]) 12/15/2020   Obstructive sleep apnea 11/24/2020   Hypertension 08/31/2019   Low back pain 06/23/2018   Depression, recurrent ([place]) 06/02/2018   Vitamin D deficiency 06/02/2018      Review of Systems  All other systems reviewed and are negative.     Objective:     BP (!) 140/78 (BP Location: Left Arm, Cuff Size: Large)   Pulse 90   Temp 98 F (36.7 C) (Oral)   Ht '5\' 7"'$  (1.702 m)   Wt 260 lb 4.8 oz (118.1 kg)   SpO2 98%   BMI 40.77 kg/m  BP Readings from Last 3 Encounters:  05/25/22 (!) 140/78  01/20/22 104/60  01/17/22 (!) 144/88      Physical Exam Vitals reviewed.  Constitutional:      Appearance: Normal appearance. He is well-groomed. He is morbidly obese.  Cardiovascular:     Rate and Rhythm: Normal rate and regular rhythm.  Heart sounds: S1 normal and S2 normal. No murmur heard. Pulmonary:     Effort: Pulmonary effort is normal.     Breath sounds: Normal breath sounds and air entry. No rales.  Abdominal:     General: Bowel sounds are normal.  Musculoskeletal:     Right lower leg: No edema.     Left lower leg: No edema.  Neurological:     General: No focal deficit present.     Mental Status: He is alert and oriented to person, place, and time.     Gait: Gait is intact.  Psychiatric:        Mood and Affect: Mood and affect normal.      Results for orders placed or performed in visit on 05/25/22  POC HgB A1c  Result Value Ref Range   Hemoglobin  A1C 6.3 (A) 4.0 - 5.6 %   HbA1c POC (<> result, manual entry)     HbA1c, POC (prediabetic range)     HbA1c, POC (controlled diabetic range)        The ASCVD Risk score (Arnett DK, et al., 2019) failed to calculate for the following reasons:   The valid total cholesterol range is 130 to 320 mg/dL    Assessment & Plan:   Problem List Items Addressed This Visit       Unprioritized   Hypertension    Current hypertension medications:       Sig   amLODipine (NORVASC) 2.5 MG tablet (Taking) Take 1 tablet (2.5 mg total) by mouth daily.   losartan-hydrochlorothiazide (HYZAAR) 100-25 MG tablet (Taking) Take 1 tablet by mouth daily.     BP uncontrolled with just losartan/HCT listed above, will start amlodipine 2.5 mg once daily, this should lower his BP appropriately. Will see him back in 3 months for repeat BP check.      Relevant Medications   rosuvastatin (CRESTOR) 10 MG tablet   amLODipine (NORVASC) 2.5 MG tablet   Diabetes mellitus type II, controlled ([place]) - Primary    A1C actually worsened on metformin, likely due to sugar cravings pt is experiencing on the medication. Will attempt again to get him mounjaro 2.5 mg once weekly since he has tried and failed metformin therapy.       Relevant Medications   rosuvastatin (CRESTOR) 10 MG tablet   tirzepatide (MOUNJARO) 2.5 MG/0.5ML Pen   Other Relevant Orders   POC HgB A1c (Completed)   Other Visit Diagnoses     Insomnia, unspecified type       Relevant Medications   Rozerem not effective, we discussed other medications and he is agreeable to starting amitriptyline 25 mg at bedtime to help with sleep.   amitriptyline (ELAVIL) 25 MG tablet       Return in about 3 months (around 08/24/2022) for follow up on DM and sleep.    Farrel Conners, MD

## 2022-05-26 ENCOUNTER — Other Ambulatory Visit (HOSPITAL_COMMUNITY): Payer: Self-pay

## 2022-05-27 NOTE — Assessment & Plan Note (Signed)
Current hypertension medications:       Sig   amLODipine (NORVASC) 2.5 MG tablet (Taking) Take 1 tablet (2.5 mg total) by mouth daily.   losartan-hydrochlorothiazide (HYZAAR) 100-25 MG tablet (Taking) Take 1 tablet by mouth daily.      BP uncontrolled with just losartan/HCT listed above, will start amlodipine 2.5 mg once daily, this should lower his BP appropriately. Will see him back in 3 months for repeat BP check.

## 2022-05-27 NOTE — Assessment & Plan Note (Signed)
A1C actually worsened on metformin, likely due to sugar cravings pt is experiencing on the medication. Will attempt again to get him mounjaro 2.5 mg once weekly since he has tried and failed metformin therapy.

## 2022-05-28 ENCOUNTER — Other Ambulatory Visit (HOSPITAL_COMMUNITY): Payer: Self-pay

## 2022-05-29 ENCOUNTER — Other Ambulatory Visit: Payer: Self-pay

## 2022-06-01 ENCOUNTER — Other Ambulatory Visit (HOSPITAL_COMMUNITY): Payer: Self-pay

## 2022-06-11 ENCOUNTER — Other Ambulatory Visit (HOSPITAL_BASED_OUTPATIENT_CLINIC_OR_DEPARTMENT_OTHER): Payer: Self-pay

## 2022-06-11 MED ORDER — COMIRNATY 30 MCG/0.3ML IM SUSY
PREFILLED_SYRINGE | INTRAMUSCULAR | 0 refills | Status: DC
  Filled 2022-06-11: qty 0.3, 1d supply, fill #0

## 2022-07-12 ENCOUNTER — Other Ambulatory Visit (HOSPITAL_COMMUNITY): Payer: Self-pay

## 2022-07-12 ENCOUNTER — Other Ambulatory Visit: Payer: Self-pay | Admitting: Family Medicine

## 2022-07-12 DIAGNOSIS — M545 Low back pain, unspecified: Secondary | ICD-10-CM

## 2022-07-12 MED ORDER — MELOXICAM 15 MG PO TABS
15.0000 mg | ORAL_TABLET | Freq: Every day | ORAL | 1 refills | Status: DC
  Filled 2022-07-12: qty 90, 90d supply, fill #0
  Filled 2022-10-14: qty 90, 90d supply, fill #1

## 2022-08-16 ENCOUNTER — Other Ambulatory Visit (HOSPITAL_COMMUNITY): Payer: Self-pay

## 2022-08-18 ENCOUNTER — Other Ambulatory Visit: Payer: Self-pay | Admitting: Family Medicine

## 2022-08-18 DIAGNOSIS — G4700 Insomnia, unspecified: Secondary | ICD-10-CM

## 2022-08-19 ENCOUNTER — Other Ambulatory Visit: Payer: Self-pay

## 2022-08-19 ENCOUNTER — Other Ambulatory Visit (HOSPITAL_COMMUNITY): Payer: Self-pay

## 2022-08-19 MED ORDER — AMITRIPTYLINE HCL 25 MG PO TABS
25.0000 mg | ORAL_TABLET | Freq: Every day | ORAL | 0 refills | Status: DC
  Filled 2022-08-19: qty 90, 90d supply, fill #0

## 2022-09-29 ENCOUNTER — Ambulatory Visit: Payer: 59 | Attending: Cardiology

## 2022-09-29 DIAGNOSIS — I77819 Aortic ectasia, unspecified site: Secondary | ICD-10-CM

## 2022-09-30 ENCOUNTER — Encounter: Payer: Self-pay | Admitting: Family Medicine

## 2022-09-30 ENCOUNTER — Ambulatory Visit (INDEPENDENT_AMBULATORY_CARE_PROVIDER_SITE_OTHER): Payer: 59 | Admitting: Family Medicine

## 2022-09-30 ENCOUNTER — Other Ambulatory Visit (HOSPITAL_COMMUNITY): Payer: Self-pay

## 2022-09-30 VITALS — BP 118/60 | HR 75 | Temp 97.9°F | Ht 67.0 in | Wt 241.0 lb

## 2022-09-30 DIAGNOSIS — I10 Essential (primary) hypertension: Secondary | ICD-10-CM

## 2022-09-30 DIAGNOSIS — F321 Major depressive disorder, single episode, moderate: Secondary | ICD-10-CM

## 2022-09-30 DIAGNOSIS — Z7985 Long-term (current) use of injectable non-insulin antidiabetic drugs: Secondary | ICD-10-CM

## 2022-09-30 DIAGNOSIS — G4733 Obstructive sleep apnea (adult) (pediatric): Secondary | ICD-10-CM

## 2022-09-30 DIAGNOSIS — E559 Vitamin D deficiency, unspecified: Secondary | ICD-10-CM | POA: Diagnosis not present

## 2022-09-30 DIAGNOSIS — E1169 Type 2 diabetes mellitus with other specified complication: Secondary | ICD-10-CM

## 2022-09-30 LAB — BASIC METABOLIC PANEL
BUN/Creatinine Ratio: 18 (ref 9–20)
BUN: 16 mg/dL (ref 6–24)
CO2: 23 mmol/L (ref 20–29)
Calcium: 9.8 mg/dL (ref 8.7–10.2)
Chloride: 98 mmol/L (ref 96–106)
Creatinine, Ser: 0.91 mg/dL (ref 0.76–1.27)
Glucose: 95 mg/dL (ref 70–99)
Potassium: 4.3 mmol/L (ref 3.5–5.2)
Sodium: 138 mmol/L (ref 134–144)
eGFR: 100 mL/min/{1.73_m2} (ref >59)

## 2022-09-30 LAB — LIPID PANEL
Cholesterol: 97 mg/dL (ref 0–200)
HDL: 30.3 mg/dL — ABNORMAL LOW (ref >39.00)
NonHDL: 66.32
Total CHOL/HDL Ratio: 3
Triglycerides: 205 mg/dL — ABNORMAL HIGH (ref 0.0–149.0)
VLDL: 41 mg/dL — ABNORMAL HIGH (ref 0.0–40.0)

## 2022-09-30 LAB — HEPATIC FUNCTION PANEL
ALT: 54 U/L — ABNORMAL HIGH (ref 0–53)
AST: 32 U/L (ref 0–37)
Albumin: 4.6 g/dL (ref 3.5–5.2)
Alkaline Phosphatase: 59 U/L (ref 39–117)
Bilirubin, Direct: 0.2 mg/dL (ref 0.0–0.3)
Total Bilirubin: 0.7 mg/dL (ref 0.2–1.2)
Total Protein: 8.2 g/dL (ref 6.0–8.3)

## 2022-09-30 LAB — POCT GLYCOSYLATED HEMOGLOBIN (HGB A1C): Hemoglobin A1C: 5.2 % (ref 4.0–5.6)

## 2022-09-30 LAB — LDL CHOLESTEROL, DIRECT: Direct LDL: 51 mg/dL

## 2022-09-30 LAB — VITAMIN D 25 HYDROXY (VIT D DEFICIENCY, FRACTURES): VITD: 34.79 ng/mL (ref 30.00–100.00)

## 2022-09-30 LAB — MICROALBUMIN / CREATININE URINE RATIO
Creatinine,U: 81.5 mg/dL
Microalb Creat Ratio: 2 mg/g (ref 0.0–30.0)
Microalb, Ur: 1.6 mg/dL (ref 0.0–1.9)

## 2022-09-30 MED ORDER — DESVENLAFAXINE SUCCINATE ER 50 MG PO TB24
50.0000 mg | ORAL_TABLET | Freq: Every day | ORAL | 1 refills | Status: DC
Start: 2022-09-30 — End: 2023-05-13
  Filled 2022-09-30 – 2022-11-16 (×2): qty 90, 90d supply, fill #0
  Filled 2023-02-08: qty 90, 90d supply, fill #1

## 2022-09-30 MED ORDER — LOSARTAN POTASSIUM-HCTZ 100-25 MG PO TABS
1.0000 | ORAL_TABLET | Freq: Every day | ORAL | 1 refills | Status: DC
Start: 2022-09-30 — End: 2023-05-13
  Filled 2022-09-30 – 2022-11-16 (×2): qty 90, 90d supply, fill #0
  Filled 2023-02-08: qty 90, 90d supply, fill #1

## 2022-09-30 MED ORDER — AMLODIPINE BESYLATE 2.5 MG PO TABS
2.5000 mg | ORAL_TABLET | Freq: Every day | ORAL | 1 refills | Status: DC
Start: 2022-09-30 — End: 2023-05-13
  Filled 2022-09-30 – 2022-11-16 (×2): qty 90, 90d supply, fill #0
  Filled 2023-02-08: qty 90, 90d supply, fill #1

## 2022-09-30 MED ORDER — BUPROPION HCL ER (XL) 300 MG PO TB24
300.0000 mg | ORAL_TABLET | Freq: Every day | ORAL | 1 refills | Status: DC
Start: 2022-09-30 — End: 2023-05-13
  Filled 2022-09-30 – 2022-11-16 (×2): qty 90, 90d supply, fill #0
  Filled 2023-02-08: qty 90, 90d supply, fill #1

## 2022-09-30 MED ORDER — TIRZEPATIDE 2.5 MG/0.5ML ~~LOC~~ SOAJ
2.5000 mg | SUBCUTANEOUS | 5 refills | Status: DC
  Filled 2022-09-30: qty 2, 28d supply, fill #0
  Filled 2022-11-04: qty 2, 28d supply, fill #1
  Filled 2022-11-30: qty 2, 28d supply, fill #2
  Filled 2022-12-27: qty 2, 28d supply, fill #3
  Filled 2023-01-24: qty 2, 28d supply, fill #4
  Filled 2023-02-20: qty 2, 28d supply, fill #5

## 2022-09-30 NOTE — Progress Notes (Signed)
Established Patient Office Visit  Subjective   Patient ID: Adam Mcdonald, male    DOB: Oct 27, 1965  Age: 57 y.o. MRN: [id number]  Chief Complaint  Patient presents with   Medical Management of Chronic Issues    Pt is here for follow up today for his DM and HTN.  Pt reports he has had increasing frustration at work, still struggling with his energy level also. States that sometimes he has trouble with the conditions at work, sometimes coworkers, Catering manager. States that he is trying very hard not to be confrontational. States that he has "ups and downs. " We had a discussion about different coping methods and strategies. We also discussed referring to therapy as well.   DM-- A1C today is much better, down to 5.2 with the 2.5 mg mounjaro. Pt reports he lost 20 pounds since the last visit, he is also using dietary changes to help. He denies any blurry vision or numbness/tingling.   HTN -- BP in office performed and is well controlled. He  reports no side effects to the medications, no chest pain, SOB, dizziness or headaches. He has a BP cuff at home and is checking BP regularly, reports they are in the normal range.   OSA-- pt continues to use his CPAP machine, however he would like a new referral to see if his machine is working properly. Hasn't been seen in several years. States he has trouble with the mask fitting over his beard sometimes.    Current Outpatient Medications  Medication Instructions   Acetaminophen (TYLENOL PO) Oral, As needed   amitriptyline (ELAVIL) 25 MG tablet Take 1 tablet (25 mg) by mouth at bedtime. Stop rozerem.   amLODipine (NORVASC) 2.5 mg, Oral, Daily   Aspirin-Acetaminophen-Caffeine (EXCEDRIN PO) Oral   buPROPion (WELLBUTRIN XL) 300 mg, Oral, Daily   ciclopirox (PENLAC) 8 % solution Topical, Daily at bedtime, Apply over nail and surrounding skin. Apply daily over previous coat. After seven (7) days, may remove with alcohol and continue cycle.   COVID-19 mRNA vaccine  2023-2024 ([facility]IRNATY) syringe Intramuscular   desvenlafaxine (PRISTIQ) 50 MG 24 hr tablet Take 1 tablet by mouth daily.   diphenhydrAMINE-APAP, sleep, (TYLENOL PM EXTRA STRENGTH PO) Oral, As needed   losartan-hydrochlorothiazide (HYZAAR) 100-25 MG tablet 1 tablet, Oral, Daily   Loteprednol-Tobramycin (ZYLET) 0.5-0.3 % SUSP Instill 1 drop into left eye three times a day   meloxicam (MOBIC) 15 mg, Oral, Daily   Multiple Vitamin (MULTI-VITAMIN DAILY PO) Oral   Omega 3 1200 MG CAPS Oral   rosuvastatin (CRESTOR) 10 mg, Oral, Daily   tirzepatide (MOUNJARO) 2.5 mg, Subcutaneous, Weekly   VITAMIN D PO 5,000 Units, Oral, Daily    Patient Active Problem List   Diagnosis Date Noted   Depression, major, single episode, moderate 09/30/2022   Diabetes mellitus type II, controlled 12/15/2020   Obstructive sleep apnea 11/24/2020   Hypertension 08/31/2019   Low back pain 06/23/2018   Vitamin D deficiency 06/02/2018      Review of Systems  All other systems reviewed and are negative.     Objective:     BP 118/60 (BP Location: Left Arm, Patient Position: Sitting, Cuff Size: Large)   Pulse 75   Temp 97.9 F (36.6 C) (Oral)   Ht  (1.702 m)   Wt 241 lb (109.3 kg)   SpO2 98%   BMI 37.75 kg/m    Physical Exam Vitals reviewed.  Constitutional:      Appearance: Normal appearance. He is well-groomed.  He is obese.  Eyes:     Conjunctiva/sclera: Conjunctivae normal.  Neck:     Thyroid: No thyromegaly.  Cardiovascular:     Rate and Rhythm: Normal rate and regular rhythm.     Heart sounds: S1 normal and S2 normal. No murmur heard. Pulmonary:     Effort: Pulmonary effort is normal.     Breath sounds: Normal breath sounds and air entry. No rales.  Abdominal:     General: Bowel sounds are normal.  Musculoskeletal:     Right lower leg: No edema.     Left lower leg: No edema.  Neurological:     General: No focal deficit present.     Mental Status: He is alert and oriented to person,  place, and time.     Gait: Gait is intact.  Psychiatric:        Mood and Affect: Mood and affect normal.      Results for orders placed or performed in visit on 09/30/22  POC HgB A1c  Result Value Ref Range   Hemoglobin A1C 5.2 4.0 - 5.6 %   HbA1c POC (<> result, manual entry)     HbA1c, POC (prediabetic range)     HbA1c, POC (controlled diabetic range)        The ASCVD Risk score (Arnett DK, et al., 2019) failed to calculate for the following reasons:   The valid total cholesterol range is 130 to 320 mg/dL    Assessment & Plan:   Controlled type 2 diabetes mellitus with other specified complication, without long-term current use of insulin Assessment & Plan: Very well controlled on the mounjaro, will continue this medication. He is due for lipid panel and LFT's today.   Orders: -     POCT glycosylated hemoglobin (Hb A1C) -     Lipid panel -     Hepatic function panel -     Microalbumin / creatinine urine ratio -     Tirzepatide; Inject 2.5 mg into the skin once a week.  Dispense: 2 mL; Refill: 5  Vitamin D deficiency Assessment & Plan: Needs new level checked today  Orders: -     VITAMIN D 25 Hydroxy (Vit-D Deficiency, Fractures)  Primary hypertension Assessment & Plan: Well controlled with the addition of the amlodipine. Reviewed his BMP from 09/29/22.   Orders: -     Losartan Potassium-HCTZ; Take 1 tablet by mouth daily.  Dispense: 90 tablet; Refill: 1 -     amLODIPine Besylate; Take 1 tablet (2.5 mg total) by mouth daily.  Dispense: 90 tablet; Refill: 1  Obstructive sleep apnea -     Ambulatory referral to Pulmonology  Depression, major, single episode, moderate Assessment & Plan: Pt having increased stress at work but thinks his medication is working well for him, we discussed coping strategies and I gave him a brochure of our psychologist if he was considering starting therapy. RTC 6 months. Continue meds as prescribed.   Orders: -     buPROPion HCl ER  (XL); Take 1 tablet (300 mg total) by mouth daily.  Dispense: 90 tablet; Refill: 1 -     Desvenlafaxine Succinate ER; Take 1 tablet by mouth daily.  Dispense: 90 tablet; Refill: 1      Return in about 6 months (around 04/01/2023) for DM, HTN.    Karie Georges, MD

## 2022-09-30 NOTE — Assessment & Plan Note (Signed)
Well controlled with the addition of the amlodipine. Reviewed his BMP from 09/29/22.

## 2022-09-30 NOTE — Assessment & Plan Note (Signed)
Pt having increased stress at work but thinks his medication is working well for him, we discussed coping strategies and I gave him a brochure of our psychologist if he was considering starting therapy. RTC 6 months. Continue meds as prescribed.

## 2022-09-30 NOTE — Assessment & Plan Note (Signed)
Very well controlled on the mounjaro, will continue this medication. He is due for lipid panel and LFT's today.

## 2022-09-30 NOTE — Assessment & Plan Note (Signed)
Needs new level checked today

## 2022-10-01 ENCOUNTER — Ambulatory Visit (HOSPITAL_COMMUNITY)
Admission: RE | Admit: 2022-10-01 | Discharge: 2022-10-01 | Disposition: A | Discharge status: Home / Self Care | Payer: 59 | Source: Ambulatory Visit | Attending: Cardiology | Admitting: Cardiology

## 2022-10-01 ENCOUNTER — Encounter (HOSPITAL_COMMUNITY): Payer: Self-pay

## 2022-10-01 DIAGNOSIS — I77819 Aortic ectasia, unspecified site: Secondary | ICD-10-CM | POA: Diagnosis not present

## 2022-10-01 DIAGNOSIS — I700 Atherosclerosis of aorta: Secondary | ICD-10-CM | POA: Diagnosis not present

## 2022-10-01 MED ORDER — IOHEXOL 350 MG/ML SOLN
100.0000 mL | Freq: Once | INTRAVENOUS | Status: AC | PRN
  Administered 2022-10-01: 100 mL via INTRAVENOUS

## 2022-10-01 MED ORDER — SODIUM CHLORIDE (PF) 0.9 % IJ SOLN
INTRAMUSCULAR | Status: AC
  Filled 2022-10-01: qty 50

## 2022-10-04 ENCOUNTER — Other Ambulatory Visit (HOSPITAL_COMMUNITY): Payer: Self-pay

## 2022-10-06 ENCOUNTER — Inpatient Hospital Stay: Admission: RE | Admit: 2022-10-06 | Payer: No Typology Code available for payment source | Source: Ambulatory Visit

## 2022-10-14 ENCOUNTER — Other Ambulatory Visit (HOSPITAL_COMMUNITY): Payer: Self-pay

## 2022-10-19 ENCOUNTER — Institutional Professional Consult (permissible substitution): Payer: 59 | Admitting: Pulmonary Disease

## 2022-10-26 ENCOUNTER — Telehealth: Payer: Self-pay | Admitting: Pulmonary Disease

## 2022-10-26 ENCOUNTER — Encounter: Payer: Self-pay | Admitting: Pulmonary Disease

## 2022-10-26 ENCOUNTER — Ambulatory Visit (INDEPENDENT_AMBULATORY_CARE_PROVIDER_SITE_OTHER): Payer: 59 | Admitting: Pulmonary Disease

## 2022-10-26 VITALS — BP 124/78 | HR 75 | Ht 67.0 in | Wt 240.4 lb

## 2022-10-26 DIAGNOSIS — G4733 Obstructive sleep apnea (adult) (pediatric): Secondary | ICD-10-CM | POA: Diagnosis not present

## 2022-10-26 NOTE — Patient Instructions (Signed)
I will see you back in about 8 weeks  We will schedule you for a home sleep study, we will update your results as soon as reviewed Plan will be to get you a new CPAP machine with AutoSet, self adjust for optimal pressures  Consider treatment up your beard to see with you have less leaks  Continue weight optimization  Call us with significant concerns

## 2022-10-26 NOTE — Telephone Encounter (Signed)
Compliance reviewed  100% compliance Mask fit looks good with mostly 100%  Residual AHI of 10.4

## 2022-10-26 NOTE — Progress Notes (Signed)
Adam Mcdonald    [id number]    12/28/1965  Primary Care Physician:Michael, Vinetta Bergamo, MD  Referring Physician: Karie Georges, MD [address] [place],  [place] [address]  Chief complaint:   Patient with complaints of drowsiness in the late afternoons and moderate drowsiness just after lunch History of severe obstructive sleep apnea  HPI:  Severe obstructive sleep apnea diagnosed in 2013 Had another study in 2017  Reports AHI is still a little high he had about 10 Suboptimal mask fit -Presumably related to a full beard  Started off with nasal masks and at some point was changed to a fullface mask as he was requiring more than 10 cm of pressure  Usually tries to go to bed between 730 and 9 PM Has difficulty staying up until 730 is able to feel very exhausted before this time Sleep onset about 15 to 45 minutes Usually 1 awakening Final wake up time about 5 AM  Weight is down recently about 15 pounds  Has a history of hypertension, diabetes, hypercholesterolemia, depression for which she is on Wellbutrin and Pristiq at 50 mg  Feels depression is better controlled at present  Never smoker  Mom had obstructive sleep apnea  He is an Insurance account manager Works at [employer]  Is unable to wear formal Shirts because of his neck size which he estimates may be over 21 inches  He is very active at work usually sustaining about 11,000- 15,000 steps at work, does not do any other formal exercises at present  Outpatient Encounter Medications as of 10/26/2022  Medication Sig   Acetaminophen (TYLENOL PO) Take by mouth as needed.   amitriptyline (ELAVIL) 25 MG tablet Take 1 tablet (25 mg) by mouth at bedtime. Stop rozerem.   amLODipine (NORVASC) 2.5 MG tablet Take 1 tablet (2.5 mg total) by mouth daily.   Aspirin-Acetaminophen-Caffeine (EXCEDRIN PO) Take by mouth.   buPROPion (WELLBUTRIN XL) 300 MG 24 hr tablet Take 1 tablet (300 mg total) by mouth  daily.   ciclopirox (PENLAC) 8 % solution Apply topically at bedtime. Apply over nail and surrounding skin. Apply daily over previous coat. After seven (7) days, may remove with alcohol and continue cycle.   desvenlafaxine (PRISTIQ) 50 MG 24 hr tablet Take 1 tablet by mouth daily.   diphenhydrAMINE-APAP, sleep, (TYLENOL PM EXTRA STRENGTH PO) Take by mouth as needed.   losartan-hydrochlorothiazide (HYZAAR) 100-25 MG tablet Take 1 tablet by mouth daily.   Loteprednol-Tobramycin (ZYLET) 0.5-0.3 % SUSP Instill 1 drop into left eye three times a day   meloxicam (MOBIC) 15 MG tablet Take 1 tablet (15 mg total) by mouth daily.   Multiple Vitamin (MULTI-VITAMIN DAILY PO) Take by mouth.   Omega 3 1200 MG CAPS Take by mouth.   rosuvastatin (CRESTOR) 10 MG tablet Take 1 tablet (10 mg total) by mouth daily.   tirzepatide [facility]) 2.5 MG/0.5ML Pen Inject 2.5 mg into the skin once a week.   VITAMIN D PO Take 5,000 Units by mouth daily.   COVID-19 mRNA vaccine 2023-2024 (COMIRNATY) syringe Inject into the muscle. (Patient not taking: Reported on 10/26/2022)   No facility-administered encounter medications on file as of 10/26/2022.    Allergies as of 10/26/2022 - Review Complete 10/26/2022  Allergen Reaction Noted   Other  06/02/2018   Trazodone and nefazodone  08/31/2019    Past Medical History:  Diagnosis Date   Chicken pox    Chronic pain  Depression    History of MRSA infection    Hypertension    Sleep apnea    Sleep apnea    Ventral hernia without obstruction or gangrene    Vitamin D deficiency     Past Surgical History:  Procedure Laterality Date   ACHILLES TENDON REPAIR Left 02/16/2019   UVULOPALATOPHARYNGOPLASTY      Family History  Problem Relation Age of Onset   Diabetes Mother    Depression Mother    High blood pressure Father    Stroke Maternal Grandfather    Heart attack Paternal Grandmother 62   Heart attack Paternal Grandfather 48    Social History    Socioeconomic History   Marital status: Single    Spouse name: Not on file   Number of children: Not on file   Years of education: Not on file   Highest education level: 12th grade  Occupational History   Not on file  Tobacco Use   Smoking status: Never   Smokeless tobacco: Never  Substance and Sexual Activity   Alcohol use: Not Currently    Comment: nothing since June 2021   Drug use: Never   Sexual activity: Yes    Partners: Female  Other Topics Concern   Not on file  Social History Narrative   Not on file   Social Determinants of Health   Financial Resource Strain: Low Risk  (06/06/2021)   Overall Financial Resource Strain (CARDIA)    Difficulty of Paying Living Expenses: Not hard at all  Food Insecurity: No Food Insecurity (06/06/2021)   Hunger Vital Sign    Worried About Running Out of Food in the Last Year: Never true    Ran Out of Food in the Last Year: Never true  Transportation Needs: No Transportation Needs (06/06/2021)   PRAPARE - Administrator, Civil Service (Medical): No    Lack of Transportation (Non-Medical): No  Physical Activity: Sufficiently Active (06/06/2021)   Exercise Vital Sign    Days of Exercise per Week: 5 days    Minutes of Exercise per Session: 60 min  Stress: Stress Concern Present (06/06/2021)   [employer] of Occupational Health - Occupational Stress Questionnaire    Feeling of Stress : To some extent  Social Connections: Socially Isolated (06/06/2021)   Social Connection and Isolation Panel [NHANES]    Frequency of Communication with Friends and Family: Once a week    Frequency of Social Gatherings with Friends and Family: Never    Attends Religious Services: More than 4 times per year    Active Member of [employer] or Organizations: No    Attends Engineer, structural: Not on file    Marital Status: Never married  Intimate Partner Violence: Not on file    Review of Systems  Constitutional:  Positive for  fatigue.  Respiratory:  Positive for apnea.   Psychiatric/Behavioral:  Positive for sleep disturbance.     Vitals:   10/26/22 0826  BP: 124/78  Pulse: 75  SpO2: 97%     Physical Exam Constitutional:      Appearance: He is obese.  HENT:     Head: Normocephalic.     Mouth/Throat:     Mouth: Mucous membranes are moist.     Comments: Mallampati 4, crowded oropharynx Eyes:     General: No scleral icterus.    Pupils: Pupils are equal, round, and reactive to light.  Neck:     Comments: Very thick Cardiovascular:  Rate and Rhythm: Normal rate and regular rhythm.     Heart sounds: No murmur heard.    No friction rub.  Pulmonary:     Effort: No respiratory distress.     Breath sounds: No stridor. No wheezing or rhonchi.  Musculoskeletal:     Cervical back: No rigidity or tenderness.  Neurological:     Mental Status: He is alert.  Psychiatric:        Mood and Affect: Mood normal.       10/26/2022    8:00 AM  Results of the Epworth flowsheet  Sitting and reading 2  Watching TV 2  Sitting, inactive in a public place (e.g. a theatre or a meeting) 0  As a passenger in a car for an hour without a break 1  Lying down to rest in the afternoon when circumstances permit 3  Sitting and talking to someone 0  Sitting quietly after a lunch without alcohol 1  In a car, while stopped for a few minutes in traffic 0  Total score 9   Data Reviewed: Sleep study from 2013 reviewed showing he was titrated to a pressure of 10  Was increased to a pressure of 12  Will try and obtain a download for [place] to review  Assessment:  History of severe obstructive sleep apnea  Still with excessive daytime sleepiness  Issues with mask fit  Elevated AHI on monitoring  Cause for his excessive daytime sleepiness may be multifactorial which may include suboptimally treated sleep disordered breathing, issues with mask fit  His machine is dated, at least about 10 years, sometimes has issues  with the humidification  Plan/Recommendations: Schedule patient for home sleep test to ascertain severity of his sleep disordered breathing  Goal will be to initiate auto CPAP  I did encourage patient's to trim up his beard to see whether he gets a better seal with his mask  Graded exercises as tolerated  Possibility of Pristiq and side effect profile was also discussed, may cause orthostatic hypotension, may cause some drowsiness and somnolence-he feels since starting Pristiq that his depression is better controlled  Tentative follow-up in about 8 weeks  Encouraged to call with significant concerns  Possibility of needing stimulants if we have a situation of optimally treated sleep disordered breathing with persistence of daytime symptoms was discussed  Virl Diamond MD [facility] 10/26/2022, 9:12 AM  CC: Karie Georges, MD

## 2022-11-10 ENCOUNTER — Ambulatory Visit (INDEPENDENT_AMBULATORY_CARE_PROVIDER_SITE_OTHER): Payer: 59 | Admitting: Family Medicine

## 2022-11-10 ENCOUNTER — Encounter: Payer: Self-pay | Admitting: Family Medicine

## 2022-11-10 ENCOUNTER — Ambulatory Visit (INDEPENDENT_AMBULATORY_CARE_PROVIDER_SITE_OTHER): Payer: 59

## 2022-11-10 ENCOUNTER — Other Ambulatory Visit (HOSPITAL_COMMUNITY): Payer: Self-pay

## 2022-11-10 VITALS — BP 128/72 | HR 85 | Temp 98.1°F | Ht 67.0 in | Wt 230.6 lb

## 2022-11-10 DIAGNOSIS — M7042 Prepatellar bursitis, left knee: Secondary | ICD-10-CM

## 2022-11-10 DIAGNOSIS — M25562 Pain in left knee: Secondary | ICD-10-CM | POA: Diagnosis not present

## 2022-11-10 LAB — CK: Total CK: 118 U/L (ref 7–232)

## 2022-11-10 LAB — URIC ACID: Uric Acid, Serum: 6.4 mg/dL (ref 4.0–7.8)

## 2022-11-10 MED ORDER — METHYLPREDNISOLONE 4 MG PO TBPK
ORAL_TABLET | ORAL | 0 refills | Status: DC
Start: 2022-11-10 — End: 2023-05-13
  Filled 2022-11-10: qty 21, 6d supply, fill #0

## 2022-11-10 NOTE — Progress Notes (Unsigned)
   Acute Office Visit  Subjective:     Patient ID: Adam Mcdonald, male    DOB: 1966/02/07, 57 y.o.   MRN: [id number]  Chief Complaint  Patient presents with   Knee Pain    Patient complains of left knee pain x1 week, taking Meloxicam and tried Voltaren gel with no relief, no known injury   Pain    Patient complains of right elbow pain x2 weeks, noticed 2 days after carrying 35 lb bucket at work    Knee Pain    Patient is in today for left knee pain for the past week, states that he didn't injure the knee, but is having severe pain when he is trying to walk and bend it/ climb steps. Very sensitive to touch or any movement.  Reports that he does some kneeling at work and that was extremely painful.  Also reporting right elbow pain for 2 weeks, states that he has gotten tennis elbow in the past but this is a little different. Is on the lateral portion of the forearm just below the   Review of Systems  All other systems reviewed and are negative.       Objective:    BP 128/72 (BP Location: Left Arm, Patient Position: Sitting, Cuff Size: Large)   Pulse 85   Temp 98.1 F (36.7 C) (Oral)   Ht 5\' 7"  (1.702 m)   Wt 230 lb 9.6 oz (104.6 kg)   SpO2 98%   BMI 36.12 kg/m  {Vitals History (Optional):23777}  Physical Exam Vitals reviewed.  Constitutional:      Appearance: Normal appearance. He is well-groomed and normal weight.  Eyes:     Extraocular Movements: Extraocular movements intact.     Conjunctiva/sclera: Conjunctivae normal.  Neck:     Thyroid: No thyromegaly.  Pulmonary:     Breath sounds: Normal air entry.  Neurological:     General: No focal deficit present.     Mental Status: He is alert and oriented to person, place, and time.     Gait: Gait is intact.  Psychiatric:        Mood and Affect: Mood and affect normal.     No results found for any visits on 11/10/22.      Assessment & Plan:   Problem List Items Addressed This Visit   None Visit Diagnoses      Prepatellar bursitis of left knee    -  Primary   Relevant Medications   methylPREDNISolone (MEDROL DOSEPAK) 4 MG TBPK tablet   Other Relevant Orders   CK   Uric Acid   DG Knee Complete 4 Views Left       Meds ordered this encounter  Medications   methylPREDNISolone (MEDROL DOSEPAK) 4 MG TBPK tablet    Sig: Take package as directed.    Dispense:  21 each    Refill:  0    No follow-ups on file.  Karie Georges, MD

## 2022-11-15 ENCOUNTER — Ambulatory Visit: Payer: 59 | Admitting: Family Medicine

## 2022-11-16 ENCOUNTER — Other Ambulatory Visit: Payer: Self-pay | Admitting: Family Medicine

## 2022-11-16 ENCOUNTER — Other Ambulatory Visit (HOSPITAL_COMMUNITY): Payer: Self-pay

## 2022-11-16 DIAGNOSIS — G4700 Insomnia, unspecified: Secondary | ICD-10-CM

## 2022-11-17 ENCOUNTER — Other Ambulatory Visit (HOSPITAL_COMMUNITY): Payer: Self-pay

## 2022-11-17 ENCOUNTER — Other Ambulatory Visit: Payer: Self-pay

## 2022-11-17 MED ORDER — AMITRIPTYLINE HCL 25 MG PO TABS
25.0000 mg | ORAL_TABLET | Freq: Every day | ORAL | 0 refills | Status: DC
Start: 2022-11-17 — End: 2023-02-08
  Filled 2022-11-17: qty 90, 90d supply, fill #0

## 2022-11-18 ENCOUNTER — Encounter (INDEPENDENT_AMBULATORY_CARE_PROVIDER_SITE_OTHER): Payer: 59

## 2022-11-18 ENCOUNTER — Telehealth: Payer: Self-pay | Admitting: Pulmonary Disease

## 2022-11-18 DIAGNOSIS — G4733 Obstructive sleep apnea (adult) (pediatric): Secondary | ICD-10-CM | POA: Diagnosis not present

## 2022-11-18 NOTE — Telephone Encounter (Signed)
Call patient  Sleep study result  Date of study: 11/02/2027, 11/03/2018   Impression: Variability in severity of sleep disordered breathing with the study on 28 showing severe obstructive sleep apnea and the study on 29 showing mild events with mild oxygen desaturations  Recommendation:  DME referral  CPAP therapy recommended for obstructive sleep apnea  Auto titrating CPAP with pressure settings of 5- 20 will be appropriate  Encourage weight loss measures  Follow-up in the office 4 to 6 weeks following initiation of treatment

## 2022-11-26 ENCOUNTER — Other Ambulatory Visit: Payer: Self-pay

## 2022-11-26 DIAGNOSIS — G4733 Obstructive sleep apnea (adult) (pediatric): Secondary | ICD-10-CM

## 2022-11-26 NOTE — Telephone Encounter (Signed)
ATC x1 LVM for patient to call our office back. 

## 2022-11-26 NOTE — Telephone Encounter (Signed)
Spoke with patient regarding sleep study   Date of study: 11/02/2027, 11/03/2018     Impression: Variability in severity of sleep disordered breathing with the study on 28 showing severe obstructive sleep apnea and the study on 29 showing mild events with mild oxygen desaturations   Recommendation:   DME referral   CPAP therapy recommended for obstructive sleep apnea   Auto titrating CPAP with pressure settings of 5- 20 will be appropriate   Encourage weight loss measures   Follow-up in the office 4 to 6 weeks following initiation of treatment   Advised patient I will put a order in for CPAP and to keep f/u with Dr.O in 01/2023.  Patient's voice was understanding.Nothing else further needed.

## 2023-01-13 DIAGNOSIS — G4733 Obstructive sleep apnea (adult) (pediatric): Secondary | ICD-10-CM | POA: Diagnosis not present

## 2023-01-14 ENCOUNTER — Other Ambulatory Visit (HOSPITAL_COMMUNITY): Payer: Self-pay

## 2023-01-14 ENCOUNTER — Other Ambulatory Visit: Payer: Self-pay | Admitting: Family Medicine

## 2023-01-14 DIAGNOSIS — G8929 Other chronic pain: Secondary | ICD-10-CM

## 2023-01-14 MED ORDER — MELOXICAM 15 MG PO TABS
15.0000 mg | ORAL_TABLET | Freq: Every day | ORAL | 0 refills | Status: DC
Start: 2023-01-14 — End: 2023-04-10
  Filled 2023-01-14: qty 90, 90d supply, fill #0

## 2023-01-25 ENCOUNTER — Ambulatory Visit: Payer: 59 | Admitting: Pulmonary Disease

## 2023-01-25 ENCOUNTER — Encounter: Payer: Self-pay | Admitting: Pulmonary Disease

## 2023-01-25 VITALS — BP 112/78 | HR 65 | Ht 67.0 in | Wt 245.0 lb

## 2023-01-25 DIAGNOSIS — G4733 Obstructive sleep apnea (adult) (pediatric): Secondary | ICD-10-CM

## 2023-01-25 NOTE — Patient Instructions (Signed)
DME referral for CPAP supplies -Current mask does leak quite a bit  Continue CPAP therapy  Follow-up in a year  Call us, send [place] a chart message if still having difficulty with a new mask

## 2023-01-25 NOTE — Progress Notes (Signed)
Adam Mcdonald    [id number]    1965/12/30  Primary Care Physician:Michael, Vinetta Bergamo, MD  Referring Physician: Karie Georges, MD [address] [place],  [place] [address]  Chief complaint:   History of severe obstructive sleep apnea Continues to use CPAP on a nightly basis  HPI: Okay  Severe obstructive sleep apnea diagnosed in 2013 Had another study in 2017  Still continues to have some issues with his mask, got a new mask which he does not fit as well Continues to have mask leaks  Got a new machine which seems to work well however still having issues with the mask leak  Denies any significant problems Sleeps well Wakes up feeling rested   Usually tries to go to bed between 730 and 9 PM Has difficulty staying up until 730 is able to feel very exhausted before this time Sleep onset about 15 to 45 minutes Usually 1 awakening Final wake up time about 5 AM  Continues to work on weight loss  Has a history of hypertension, diabetes, hypercholesterolemia, depression for which she is on Wellbutrin and Pristiq at 50 mg  Feels depression is better controlled at present  Never smoker  Mom had obstructive sleep apnea  He is an Insurance account manager Works at [employer]  Is unable to wear formal Shirts because of his neck size which he estimates may be over 21 inches  He is very active at work usually sustaining about 11,000- 15,000 steps at work, does not do any other formal exercises at present  Outpatient Encounter Medications as of 01/25/2023  Medication Sig   Acetaminophen (TYLENOL PO) Take by mouth as needed.   amitriptyline (ELAVIL) 25 MG tablet Take 1 tablet (25 mg) by mouth at bedtime. Stop rozerem.   amLODipine (NORVASC) 2.5 MG tablet Take 1 tablet (2.5 mg total) by mouth daily.   Aspirin-Acetaminophen-Caffeine (EXCEDRIN PO) Take by mouth.   buPROPion (WELLBUTRIN XL) 300 MG 24 hr tablet Take 1 tablet (300 mg total) by mouth daily.    ciclopirox (PENLAC) 8 % solution Apply topically at bedtime. Apply over nail and surrounding skin. Apply daily over previous coat. After seven (7) days, may remove with alcohol and continue cycle.   COVID-19 mRNA vaccine 2023-2024 (COMIRNATY) syringe Inject into the muscle.   desvenlafaxine (PRISTIQ) 50 MG 24 hr tablet Take 1 tablet by mouth daily.   diphenhydrAMINE-APAP, sleep, (TYLENOL PM EXTRA STRENGTH PO) Take by mouth as needed.   losartan-hydrochlorothiazide (HYZAAR) 100-25 MG tablet Take 1 tablet by mouth daily.   Loteprednol-Tobramycin (ZYLET) 0.5-0.3 % SUSP Instill 1 drop into left eye three times a day   meloxicam (MOBIC) 15 MG tablet Take 1 tablet (15 mg total) by mouth daily.   methylPREDNISolone (MEDROL DOSEPAK) 4 MG TBPK tablet Take package as directed.   Multiple Vitamin (MULTI-VITAMIN DAILY PO) Take by mouth.   Omega 3 1200 MG CAPS Take by mouth.   rosuvastatin (CRESTOR) 10 MG tablet Take 1 tablet (10 mg total) by mouth daily.   tirzepatide [facility]) 2.5 MG/0.5ML Pen Inject 2.5 mg into the skin once a week.   VITAMIN D PO Take 5,000 Units by mouth daily.   No facility-administered encounter medications on file as of 01/25/2023.    Allergies as of 01/25/2023 - Review Complete 01/25/2023  Allergen Reaction Noted   Other  06/02/2018   Trazodone and nefazodone  08/31/2019    Past Medical History:  Diagnosis Date  Chicken pox    Chronic pain    Depression    History of MRSA infection    Hypertension    Sleep apnea    Sleep apnea    Ventral hernia without obstruction or gangrene    Vitamin D deficiency     Past Surgical History:  Procedure Laterality Date   ACHILLES TENDON REPAIR Left 02/16/2019   UVULOPALATOPHARYNGOPLASTY      Family History  Problem Relation Age of Onset   Diabetes Mother    Depression Mother    High blood pressure Father    Stroke Maternal Grandfather    Heart attack Paternal Grandmother 80   Heart attack Paternal Grandfather 32     Social History   Socioeconomic History   Marital status: Single    Spouse name: Not on file   Number of children: Not on file   Years of education: Not on file   Highest education level: 12th grade  Occupational History   Not on file  Tobacco Use   Smoking status: Never   Smokeless tobacco: Never  Substance and Sexual Activity   Alcohol use: Not Currently    Comment: nothing since June 2021   Drug use: Never   Sexual activity: Yes    Partners: Female  Other Topics Concern   Not on file  Social History Narrative   Not on file   Social Determinants of Health   Financial Resource Strain: Low Risk  (06/06/2021)   Overall Financial Resource Strain (CARDIA)    Difficulty of Paying Living Expenses: Not hard at all  Food Insecurity: No Food Insecurity (06/06/2021)   Hunger Vital Sign    Worried About Running Out of Food in the Last Year: Never true    Ran Out of Food in the Last Year: Never true  Transportation Needs: No Transportation Needs (06/06/2021)   PRAPARE - Administrator, Civil Service (Medical): No    Lack of Transportation (Non-Medical): No  Physical Activity: Sufficiently Active (06/06/2021)   Exercise Vital Sign    Days of Exercise per Week: 5 days    Minutes of Exercise per Session: 60 min  Stress: Stress Concern Present (06/06/2021)   [employer] of Occupational Health - Occupational Stress Questionnaire    Feeling of Stress : To some extent  Social Connections: Socially Isolated (06/06/2021)   Social Connection and Isolation Panel [NHANES]    Frequency of Communication with Friends and Family: Once a week    Frequency of Social Gatherings with Friends and Family: Never    Attends Religious Services: More than 4 times per year    Active Member of [employer] or Organizations: No    Attends Engineer, structural: Not on file    Marital Status: Never married  Intimate Partner Violence: Not on file    Review of Systems   Constitutional:  Positive for fatigue.  Respiratory:  Positive for apnea.   Psychiatric/Behavioral:  Positive for sleep disturbance.     Vitals:   01/25/23 1329  BP: 112/78  Pulse: 65  SpO2: 97%     Physical Exam Constitutional:      Appearance: He is obese.  HENT:     Head: Normocephalic.     Mouth/Throat:     Mouth: Mucous membranes are moist.     Comments: Mallampati 4, crowded oropharynx Eyes:     General: No scleral icterus.    Pupils: Pupils are equal, round, and reactive to light.  Neck:  Comments: Very thick Cardiovascular:     Rate and Rhythm: Normal rate and regular rhythm.     Heart sounds: No murmur heard.    No friction rub.  Pulmonary:     Effort: No respiratory distress.     Breath sounds: No stridor. No wheezing or rhonchi.  Musculoskeletal:     Cervical back: No rigidity or tenderness.  Neurological:     Mental Status: He is alert.  Psychiatric:        Mood and Affect: Mood normal.       10/26/2022    8:00 AM  Results of the Epworth flowsheet  Sitting and reading 2  Watching TV 2  Sitting, inactive in a public place (e.g. a theatre or a meeting) 0  As a passenger in a car for an hour without a break 1  Lying down to rest in the afternoon when circumstances permit 3  Sitting and talking to someone 0  Sitting quietly after a lunch without alcohol 1  In a car, while stopped for a few minutes in traffic 0  Total score 9   Data Reviewed: Sleep study from 2013 reviewed showing he was titrated to a pressure of 10  Was increased to a pressure of 12  Download from current machine shows excellent compliance Average use of 8 hours 9 minutes Machine set 4-20  Residual AHI of 9 with severe leaks  Assessment:  History of severe obstructive sleep apnea  Still with excessive daytime sleepiness  Issues with mask fit  Still has elevated AHI on monitoring (related to significant mask leaks  Plan/Recommendations: Continue using CPAP  DME  referral for a new mask  Follow-up on a yearly basis  Graded activities as tolerated for weight loss  Encouraged to call with significant concerns  We did discuss possibility of requiring stimulants for daytime sleepiness despite optimal use of CPAP therapy  Yearly follow-up is appropriate, encouraged to give [place] a call if any has any concerns or still with significant issues with mask.  Virl Diamond MD [facility] 01/25/2023, 1:52 PM  CC: Karie Georges, MD

## 2023-02-08 ENCOUNTER — Other Ambulatory Visit: Payer: Self-pay | Admitting: Family Medicine

## 2023-02-08 ENCOUNTER — Other Ambulatory Visit: Payer: Self-pay

## 2023-02-08 DIAGNOSIS — G4700 Insomnia, unspecified: Secondary | ICD-10-CM

## 2023-02-09 ENCOUNTER — Other Ambulatory Visit (HOSPITAL_COMMUNITY): Payer: Self-pay

## 2023-02-09 MED ORDER — AMITRIPTYLINE HCL 25 MG PO TABS
25.0000 mg | ORAL_TABLET | Freq: Every day | ORAL | 1 refills | Status: DC
Start: 2023-02-09 — End: 2023-08-10
  Filled 2023-02-09: qty 90, 90d supply, fill #0
  Filled 2023-05-16: qty 90, 90d supply, fill #1

## 2023-02-13 DIAGNOSIS — G4733 Obstructive sleep apnea (adult) (pediatric): Secondary | ICD-10-CM | POA: Diagnosis not present

## 2023-02-28 ENCOUNTER — Other Ambulatory Visit (HOSPITAL_COMMUNITY): Payer: Self-pay

## 2023-03-15 DIAGNOSIS — G4733 Obstructive sleep apnea (adult) (pediatric): Secondary | ICD-10-CM | POA: Diagnosis not present

## 2023-03-25 ENCOUNTER — Other Ambulatory Visit: Payer: Self-pay | Admitting: Family Medicine

## 2023-03-25 DIAGNOSIS — E1169 Type 2 diabetes mellitus with other specified complication: Secondary | ICD-10-CM

## 2023-03-30 ENCOUNTER — Encounter: Payer: Self-pay | Admitting: Family Medicine

## 2023-03-30 DIAGNOSIS — E1169 Type 2 diabetes mellitus with other specified complication: Secondary | ICD-10-CM

## 2023-03-30 MED ORDER — TIRZEPATIDE 2.5 MG/0.5ML ~~LOC~~ SOAJ
2.5000 mg | SUBCUTANEOUS | 1 refills | Status: DC
  Filled 2023-03-30: qty 2, 28d supply, fill #0
  Filled 2023-04-25: qty 2, 28d supply, fill #1

## 2023-03-31 ENCOUNTER — Other Ambulatory Visit (HOSPITAL_COMMUNITY): Payer: Self-pay

## 2023-03-31 DIAGNOSIS — G4733 Obstructive sleep apnea (adult) (pediatric): Secondary | ICD-10-CM | POA: Diagnosis not present

## 2023-04-06 DIAGNOSIS — H40013 Open angle with borderline findings, low risk, bilateral: Secondary | ICD-10-CM | POA: Diagnosis not present

## 2023-04-06 LAB — HM DIABETES EYE EXAM

## 2023-04-10 ENCOUNTER — Other Ambulatory Visit: Payer: Self-pay | Admitting: Family Medicine

## 2023-04-10 DIAGNOSIS — G8929 Other chronic pain: Secondary | ICD-10-CM

## 2023-04-11 ENCOUNTER — Other Ambulatory Visit (HOSPITAL_COMMUNITY): Payer: Self-pay

## 2023-04-11 MED ORDER — MELOXICAM 15 MG PO TABS
15.0000 mg | ORAL_TABLET | Freq: Every day | ORAL | 0 refills | Status: DC
  Filled 2023-04-11: qty 90, 90d supply, fill #0

## 2023-04-12 ENCOUNTER — Other Ambulatory Visit (HOSPITAL_COMMUNITY): Payer: Self-pay

## 2023-04-15 DIAGNOSIS — G4733 Obstructive sleep apnea (adult) (pediatric): Secondary | ICD-10-CM | POA: Diagnosis not present

## 2023-04-27 ENCOUNTER — Other Ambulatory Visit (HOSPITAL_COMMUNITY): Payer: Self-pay

## 2023-05-01 DIAGNOSIS — G4733 Obstructive sleep apnea (adult) (pediatric): Secondary | ICD-10-CM | POA: Diagnosis not present

## 2023-05-13 ENCOUNTER — Encounter: Payer: Self-pay | Admitting: Family Medicine

## 2023-05-13 ENCOUNTER — Other Ambulatory Visit (HOSPITAL_COMMUNITY): Payer: Self-pay

## 2023-05-13 ENCOUNTER — Ambulatory Visit: Payer: 59 | Admitting: Family Medicine

## 2023-05-13 VITALS — BP 130/70 | HR 75 | Temp 98.0°F | Ht 67.0 in | Wt 238.9 lb

## 2023-05-13 DIAGNOSIS — Z7985 Long-term (current) use of injectable non-insulin antidiabetic drugs: Secondary | ICD-10-CM

## 2023-05-13 DIAGNOSIS — F321 Major depressive disorder, single episode, moderate: Secondary | ICD-10-CM | POA: Diagnosis not present

## 2023-05-13 DIAGNOSIS — I10 Essential (primary) hypertension: Secondary | ICD-10-CM | POA: Diagnosis not present

## 2023-05-13 DIAGNOSIS — H66012 Acute suppurative otitis media with spontaneous rupture of ear drum, left ear: Secondary | ICD-10-CM | POA: Diagnosis not present

## 2023-05-13 DIAGNOSIS — M7042 Prepatellar bursitis, left knee: Secondary | ICD-10-CM | POA: Diagnosis not present

## 2023-05-13 DIAGNOSIS — E1169 Type 2 diabetes mellitus with other specified complication: Secondary | ICD-10-CM | POA: Diagnosis not present

## 2023-05-13 LAB — POCT GLYCOSYLATED HEMOGLOBIN (HGB A1C): Hemoglobin A1C: 5.5 % (ref 4.0–5.6)

## 2023-05-13 MED ORDER — ROSUVASTATIN CALCIUM 10 MG PO TABS
10.0000 mg | ORAL_TABLET | Freq: Every day | ORAL | 3 refills | Status: DC
  Filled 2023-05-13: qty 90, 90d supply, fill #0
  Filled 2023-08-25: qty 90, 90d supply, fill #1
  Filled 2023-11-22: qty 90, 90d supply, fill #2
  Filled 2024-02-20: qty 90, 90d supply, fill #3

## 2023-05-13 MED ORDER — AMLODIPINE BESYLATE 2.5 MG PO TABS
2.5000 mg | ORAL_TABLET | Freq: Every day | ORAL | 1 refills | Status: DC
  Filled 2023-05-13: qty 90, 90d supply, fill #0
  Filled 2023-08-10: qty 90, 90d supply, fill #1

## 2023-05-13 MED ORDER — AMOXICILLIN-POT CLAVULANATE 875-125 MG PO TABS
1.0000 | ORAL_TABLET | Freq: Two times a day (BID) | ORAL | 0 refills | Status: DC
  Filled 2023-05-13: qty 20, 10d supply, fill #0

## 2023-05-13 MED ORDER — BUPROPION HCL ER (XL) 300 MG PO TB24
300.0000 mg | ORAL_TABLET | Freq: Every day | ORAL | 1 refills | Status: DC
  Filled 2023-05-13: qty 90, 90d supply, fill #0
  Filled 2023-08-10: qty 90, 90d supply, fill #1

## 2023-05-13 MED ORDER — DESVENLAFAXINE SUCCINATE ER 50 MG PO TB24
50.0000 mg | ORAL_TABLET | Freq: Every day | ORAL | 1 refills | Status: DC
  Filled 2023-05-13: qty 90, 90d supply, fill #0
  Filled 2023-08-10: qty 90, 90d supply, fill #1

## 2023-05-13 MED ORDER — TIRZEPATIDE 5 MG/0.5ML ~~LOC~~ SOAJ
5.0000 mg | SUBCUTANEOUS | 1 refills | Status: DC
  Filled 2023-05-13: qty 6, 84d supply, fill #0
  Filled 2023-05-14: qty 2, 28d supply, fill #0
  Filled 2023-06-28: qty 2, 28d supply, fill #1
  Filled 2023-07-22: qty 2, 28d supply, fill #2
  Filled 2023-08-20: qty 2, 28d supply, fill #3
  Filled 2023-09-17: qty 2, 28d supply, fill #4
  Filled 2023-10-13: qty 2, 28d supply, fill #5

## 2023-05-13 MED ORDER — LOSARTAN POTASSIUM-HCTZ 100-25 MG PO TABS
1.0000 | ORAL_TABLET | Freq: Every day | ORAL | 1 refills | Status: DC
  Filled 2023-05-13: qty 90, 90d supply, fill #0
  Filled 2023-08-10: qty 90, 90d supply, fill #1

## 2023-05-13 NOTE — Progress Notes (Unsigned)
Established Patient Office Visit  Subjective   Patient ID: Adam Mcdonald, male    DOB: [birth year]  Age: 57 y.o. MRN: [id number]  Chief Complaint  Patient presents with   Ear Fullness    Patient complains of Left ear fullness,x2 weeks    Knee Pain    Patient complains of left knee pain,     Pt is reporting left ear pain for the past 2 weeks, states that it feels like his ears need to pop but they won't, tried to use a Qtip but it did not help. States he kinda feels like there is something "runny" in his ear like water. No fever or chills, no ear pain.  Left knee pain-- pt reports that the steroid he was given in June actually did help, however the pain returned about 1-2 weeks later, states he has been self medicating at home with voltaren gel and he is using knee compression sleeves, heating pads, etc. Reviewed x-rays which were largely WNL. States that these methods are helping, namely the brace seems to be helping the most.  HTN -- BP in office performed and is well controlled. He  reports no side effects to the medications, no chest pain, SOB, dizziness or headaches. He has a BP cuff at home and is checking BP regularly, reports they are in the normal range.   Diabetes-- pt is currently on Mounjaro 5 mg weekly, he reports he is doing well with this medication, he denies any side effects including nausea, constipation or diarrhea, etc.  Ear Fullness  There is pain in the left ear. This is a new problem. The current episode started 1 to 4 weeks ago. The problem occurs constantly. The problem has been unchanged. There has been no fever. The patient is experiencing no pain.    Current Outpatient Medications  Medication Instructions   Acetaminophen (TYLENOL PO) Oral, As needed   amitriptyline (ELAVIL) 25 MG tablet Take 1 tablet (25 mg) by mouth at bedtime. Stop rozerem.   amLODipine (NORVASC) 2.5 mg, Oral, Daily   amoxicillin-clavulanate (AUGMENTIN) 875-125 MG tablet 1 tablet, Oral, 2  times daily   Aspirin-Acetaminophen-Caffeine (EXCEDRIN PO) Oral   buPROPion (WELLBUTRIN XL) 300 mg, Oral, Daily   ciclopirox (PENLAC) 8 % solution Topical, Daily at bedtime, Apply over nail and surrounding skin. Apply daily over previous coat. After seven (7) days, may remove with alcohol and continue cycle.   COVID-19 mRNA vaccine 2023-2024 (COMIRNATY) syringe Intramuscular   desvenlafaxine (PRISTIQ) 50 MG 24 hr tablet Take 1 tablet by mouth daily.   diphenhydrAMINE-APAP, sleep, (TYLENOL PM EXTRA STRENGTH PO) Oral, As needed   losartan-hydrochlorothiazide (HYZAAR) 100-25 MG tablet 1 tablet, Oral, Daily   Loteprednol-Tobramycin (ZYLET) 0.5-0.3 % SUSP Instill 1 drop into left eye three times a day   meloxicam (MOBIC) 15 mg, Oral, Daily   Mounjaro 5 mg, Subcutaneous, Weekly   Multiple Vitamin (MULTI-VITAMIN DAILY PO) Oral   Omega 3 1200 MG CAPS Oral   rosuvastatin (CRESTOR) 10 mg, Oral, Daily   VITAMIN D PO 5,000 Units, Oral, Daily    Patient Active Problem List   Diagnosis Date Noted   Prepatellar bursitis of left knee 05/19/2023   Depression, major, single episode, moderate (HCC) 09/30/2022   Diabetes mellitus type II, controlled (HCC) 12/15/2020   Obstructive sleep apnea 11/24/2020   Hypertension 08/31/2019   Low back pain 06/23/2018   Vitamin D deficiency 06/02/2018      Review of Systems  All other systems reviewed and are  negative.     Objective:     BP 130/70 (BP Location: Left Arm, Patient Position: Sitting, Cuff Size: Normal)   Pulse 75   Temp 98 F (36.7 C) (Oral)   Ht 5\' 7"  (1.702 m)   Wt 238 lb 14.4 oz (108.4 kg)   SpO2 99%   BMI 37.42 kg/m    Physical Exam Vitals reviewed.  Constitutional:      Appearance: Normal appearance. He is obese.  HENT:     Right Ear: Tympanic membrane and ear canal normal.     Left Ear: Drainage present. Tympanic membrane is perforated.     Ears:     Comments: Left TM could not be visualized due to excessive debris in the  ear canal which was malodorous and appeared to be purulent, possibly indicating a perforated TM? Cardiovascular:     Rate and Rhythm: Normal rate and regular rhythm.     Heart sounds: Normal heart sounds.  Pulmonary:     Effort: Pulmonary effort is normal.     Breath sounds: Normal breath sounds. No wheezing.  Neurological:     Mental Status: He is alert and oriented to person, place, and time. Mental status is at baseline.  Psychiatric:        Mood and Affect: Mood normal.        Behavior: Behavior normal.      Results for orders placed or performed in visit on 05/13/23  POC HgB A1c  Result Value Ref Range   Hemoglobin A1C 5.5 4.0 - 5.6 %   HbA1c POC (<> result, manual entry)     HbA1c, POC (prediabetic range)     HbA1c, POC (controlled diabetic range)        The ASCVD Risk score (Arnett DK, et al., 2019) failed to calculate for the following reasons:   The valid total cholesterol range is 130 to 320 mg/dL    Assessment & Plan:  Controlled type 2 diabetes mellitus with other specified complication, without long-term current use of insulin (HCC) Assessment & Plan: Chronic, stable. On mounjaro 5 mg weekly, well controlled, will continue medication as prescribed. Pt continues to gradually lose weight on this medication.   Orders: -     POCT glycosylated hemoglobin (Hb A1C) -     Rosuvastatin Calcium; Take 1 tablet (10 mg total) by mouth daily.  Dispense: 90 tablet; Refill: 3 -     Tirzepatide; Inject 5 mg into the skin once a week.  Dispense: 6 mL; Refill: 1  Prepatellar bursitis of left knee Assessment & Plan: Recurrent, now chronic, he is using knee braces and NSAIDS with only some improvement, will send to sports medicine for possible need for injection.   Orders: -     Ambulatory referral to Sports Medicine  Depression, major, single episode, moderate (HCC) -     buPROPion HCl ER (XL); Take 1 tablet (300 mg total) by mouth daily.  Dispense: 90 tablet; Refill: 1 -      Desvenlafaxine Succinate ER; Take 1 tablet by mouth daily.  Dispense: 90 tablet; Refill: 1  Primary hypertension Assessment & Plan: Chronic, stable, well controlled on the below medication, will continue as prescribed Current hypertension medications:       Sig   amLODipine (NORVASC) 2.5 MG tablet Take 1 tablet (2.5 mg total) by mouth daily.   losartan-hydrochlorothiazide (HYZAAR) 100-25 MG tablet Take 1 tablet by mouth daily.        Orders: -  amLODIPine Besylate; Take 1 tablet (2.5 mg total) by mouth daily.  Dispense: 90 tablet; Refill: 1 -     Losartan Potassium-HCTZ; Take 1 tablet by mouth daily.  Dispense: 90 tablet; Refill: 1  Non-recurrent acute suppurative otitis media of left ear with spontaneous rupture of tympanic membrane Acute finding, will start pt on augmentin BID for 10 days, pt will let me know how he responds to the treatment and if his sx persist he may need referral to ENT.  -     Amoxicillin-Pot Clavulanate; Take 1 tablet by mouth 2 (two) times daily.  Dispense: 20 tablet; Refill: 0     Return in about 5 months (around 10/03/2023) for annual physical exam.    Karie Georges, MD

## 2023-05-14 ENCOUNTER — Other Ambulatory Visit (HOSPITAL_COMMUNITY): Payer: Self-pay

## 2023-05-15 DIAGNOSIS — G4733 Obstructive sleep apnea (adult) (pediatric): Secondary | ICD-10-CM | POA: Diagnosis not present

## 2023-05-18 ENCOUNTER — Other Ambulatory Visit (HOSPITAL_COMMUNITY): Payer: Self-pay

## 2023-05-19 ENCOUNTER — Other Ambulatory Visit (HOSPITAL_COMMUNITY): Payer: Self-pay

## 2023-05-19 DIAGNOSIS — M7042 Prepatellar bursitis, left knee: Secondary | ICD-10-CM | POA: Insufficient documentation

## 2023-05-19 NOTE — Assessment & Plan Note (Signed)
Recurrent, now chronic, he is using knee braces and NSAIDS with only some improvement, will send to sports medicine for possible need for injection.

## 2023-05-19 NOTE — Assessment & Plan Note (Signed)
Chronic, stable, well controlled on the below medication, will continue as prescribed Current hypertension medications:       Sig   amLODipine (NORVASC) 2.5 MG tablet Take 1 tablet (2.5 mg total) by mouth daily.   losartan-hydrochlorothiazide (HYZAAR) 100-25 MG tablet Take 1 tablet by mouth daily.

## 2023-05-19 NOTE — Assessment & Plan Note (Signed)
Chronic, stable. On mounjaro 5 mg weekly, well controlled, will continue medication as prescribed. Pt continues to gradually lose weight on this medication.

## 2023-05-23 NOTE — Progress Notes (Signed)
I, Stevenson Clinch, CMA acting as a scribe for Clementeen Graham, MD.  Adam Mcdonald is a 57 y.o. male who presents to [facility] at [facility] today for L knee pain ongoing since end of May. Pt locates pain to lateral aspect of the knee. Denies injury to the knee. Works as a Curator at [employer]. Started doing more stairs around the time sx started. Mechanical sx present. Denies swelling. Pain radiating into the lower leg. Sx causing night disturbance at times. She relief while on oral steroid, then sx progressively worsened once completing steroids.   L Knee swelling: no Mechanical symptoms: yes Aggravates: climbing, bending Treatments tried: oral steroid in June, Voltaren gel, compression sleeve, heating pad, meloxicam  Dx testing: 11/10/22 L knee XR & Labs  Pertinent review of systems: No fevers or chills  Relevant historical information: Hypertension and diabetes.  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   Exam:  BP 134/82   Pulse 70   Ht 5\' 7"  (1.702 m)   Wt 234 lb (106.1 kg)   SpO2 98%   BMI 36.65 kg/m  General: Well Developed, well nourished, and in no acute distress.   MSK: Left knee normal-appearing no swelling.  Normal motion with retropatellar crepitation. Tender palpation lateral joint line. Stable ligamentous exam. Positive lateral McMurray's test. Intact strength.    Lab and Radiology Results  Procedure: Real-time Ultrasound Guided Injection of left knee joint superior lateral patella space Device: Philips Affiniti 50G/GE Logiq Ultrasound evaluation prior to injection shows small joint effusion. Images permanently stored and available for review in PACS Verbal informed consent obtained.  Discussed risks and benefits of procedure. Warned about infection, bleeding, hyperglycemia damage to structures among others. Patient expresses understanding and agreement Time-out conducted.   Noted no overlying erythema, induration, or other signs of  local infection.   Skin prepped in a sterile fashion.   Local anesthesia: Topical Ethyl chloride.   With sterile technique and under real time ultrasound guidance: 40 mg of Kenalog and 2 mL of Marcaine injected into knee joint. Fluid seen entering the joint capsule.   Completed without difficulty   Pain immediately resolved suggesting accurate placement of the medication.   Advised to call if fevers/chills, erythema, induration, drainage, or persistent bleeding.   Images permanently stored and available for review in the ultrasound unit.  Impression: Technically successful ultrasound guided injection.  EXAM: LEFT KNEE - COMPLETE 4 VIEW   COMPARISON:  None Available.   FINDINGS: No evidence of fracture, dislocation, or joint effusion. No evidence of arthropathy or other focal bone abnormality. Soft tissues are unremarkable. Superior and inferior patellar spurs.   IMPRESSION: No acute osseous abnormalities.     Electronically Signed   By: Layla Maw M.D.   On: 11/16/2022 19:16 I, Clementeen Graham, personally (independently) visualized and performed the interpretation of the images attached in this note.      Assessment and Plan: 57 y.o. male with chronic left knee pain.  X-ray obtained in June is unremarkable.  Ultrasound prior to injection does not show much wrong either.  He has a small joint effusion.  Plan for steroid injection.  Continue Voltaren gel and meloxicam.  Add Tylenol arthritis.  If not improving contact me.  Can repeat injection in 3 months.   PDMP not reviewed this encounter. Orders Placed This Encounter  Procedures   [place] LIMITED JOINT SPACE STRUCTURES LOW LEFT(NO LINKED CHARGES)    Reason for Exam (SYMPTOM  OR DIAGNOSIS REQUIRED):  left knee pain    Preferred imaging location?:   [facility]   No orders of the defined types were placed in this encounter.    Discussed warning signs or symptoms. Please see discharge instructions.  Patient expresses understanding.   The above documentation has been reviewed and is accurate and complete Clementeen Graham, M.D.

## 2023-05-24 ENCOUNTER — Ambulatory Visit: Payer: 59 | Admitting: Family Medicine

## 2023-05-24 ENCOUNTER — Other Ambulatory Visit: Payer: Self-pay

## 2023-05-24 ENCOUNTER — Encounter: Payer: Self-pay | Admitting: Family Medicine

## 2023-05-24 VITALS — BP 134/82 | HR 70 | Ht 67.0 in | Wt 234.0 lb

## 2023-05-24 DIAGNOSIS — G8929 Other chronic pain: Secondary | ICD-10-CM

## 2023-05-24 DIAGNOSIS — M25562 Pain in left knee: Secondary | ICD-10-CM | POA: Diagnosis not present

## 2023-05-24 NOTE — Patient Instructions (Addendum)
Thank you for coming in today.  You received an injection today. Seek immediate medical attention if the joint becomes red, extremely painful, or is oozing fluid.  Try Tylenol Arthritis (Acetaminophen) over the counter, 2 tablets every 8 hours  Try over the counter Voltaren Gel  Let me know how this goes.

## 2023-05-31 DIAGNOSIS — G4733 Obstructive sleep apnea (adult) (pediatric): Secondary | ICD-10-CM | POA: Diagnosis not present

## 2023-06-15 DIAGNOSIS — G4733 Obstructive sleep apnea (adult) (pediatric): Secondary | ICD-10-CM | POA: Diagnosis not present

## 2023-06-28 ENCOUNTER — Other Ambulatory Visit (HOSPITAL_COMMUNITY): Payer: Self-pay

## 2023-06-29 ENCOUNTER — Other Ambulatory Visit (HOSPITAL_COMMUNITY): Payer: Self-pay

## 2023-07-08 ENCOUNTER — Encounter: Payer: Self-pay | Admitting: Family Medicine

## 2023-07-08 ENCOUNTER — Other Ambulatory Visit (HOSPITAL_COMMUNITY): Payer: Self-pay

## 2023-07-08 ENCOUNTER — Ambulatory Visit: Payer: Commercial Managed Care - PPO | Admitting: Family Medicine

## 2023-07-08 VITALS — BP 132/58 | HR 82 | Temp 97.7°F | Ht 67.0 in | Wt 238.5 lb

## 2023-07-08 DIAGNOSIS — H66005 Acute suppurative otitis media without spontaneous rupture of ear drum, recurrent, left ear: Secondary | ICD-10-CM | POA: Diagnosis not present

## 2023-07-08 DIAGNOSIS — M2550 Pain in unspecified joint: Secondary | ICD-10-CM

## 2023-07-08 LAB — C-REACTIVE PROTEIN: CRP: 1.2 mg/dL (ref 0.5–20.0)

## 2023-07-08 LAB — URIC ACID: Uric Acid, Serum: 6.8 mg/dL (ref 4.0–7.8)

## 2023-07-08 MED ORDER — SULFAMETHOXAZOLE-TRIMETHOPRIM 800-160 MG PO TABS
1.0000 | ORAL_TABLET | Freq: Two times a day (BID) | ORAL | 0 refills | Status: AC
  Filled 2023-07-08: qty 14, 7d supply, fill #0

## 2023-07-08 NOTE — Progress Notes (Signed)
Acute Office Visit  Subjective:     Patient ID: Adam Mcdonald, male    DOB: 1965/06/16, 58 y.o.   MRN: [id number]  Chief Complaint  Patient presents with   Ear Pain    Patient complains of recurrent left ear pain and pressure x4 weeks   Hand Pain    Patient complains of left palmar pain x4 weeks, no known injury, taking Meloxicam, also tried Voltaren and Tylenol with no relief   Knee Pain    Patient complains of right medial knee pain x4 weeks, no known injury    Hand Pain   Knee Pain    Patient is in today for new symptoms today.  Right knee medial pain-- 4 weeks, pt is on his feet a lot at work and he reports it is getting worse, it is every day now, states that stairs are particularly difficult. Is taking meloxicam 15 mg daily, is still having the pain, also taking tylenol arthritis which also isn't helping. Patient is also having left hand pain - states that this has been of and on as well, but it has also been more consistent. States that it started about the same time as the knee pain. No numbness or tingling of the hand, just a sharp constant pain, hurts with gripping things.  Pt is reporting left ear pain which is recurrent, there is feeling of pressure, like there is diminished hearing on the left side. No fever or chills.   Review of Systems  All other systems reviewed and are negative.       Objective:    BP (!) 132/58   Pulse 82   Temp 97.7 F (36.5 C) (Oral)   Ht 5\' 7"  (1.702 m)   Wt 238 lb 8 oz (108.2 kg)   SpO2 98%   BMI 37.35 kg/m    Physical Exam Vitals reviewed.  Constitutional:      Appearance: Normal appearance. He is well-groomed. He is obese.  HENT:     Left Ear: Decreased hearing noted. A middle ear effusion is present. Tympanic membrane is scarred and bulging.     Ears:     Comments: Large air fluid level behind the TM, there appears to be purulent fluid also behind the TM Neck:     Thyroid: No thyromegaly.  Cardiovascular:     Rate  and Rhythm: Normal rate and regular rhythm.     Heart sounds: S1 normal and S2 normal. No murmur heard. Pulmonary:     Effort: Pulmonary effort is normal.     Breath sounds: Normal breath sounds and air entry. No rales.  Musculoskeletal:     Right lower leg: No edema.     Left lower leg: No edema.  Neurological:     General: No focal deficit present.     Mental Status: He is alert and oriented to person, place, and time.     Gait: Gait is intact.  Psychiatric:        Mood and Affect: Mood and affect normal.     No results found for any visits on 07/08/23.      Assessment & Plan:   Problem List Items Addressed This Visit   None Visit Diagnoses       Polyarthralgia    -  Primary   Relevant Orders   Uric Acid   Rheumatoid factor   Lyme Disease Serology w/Reflex   ANA w/Reflex   C-reactive Protein     Pain in joint, multiple  sites       Relevant Orders   Uric Acid   Rheumatoid factor   Lyme Disease Serology w/Reflex   ANA w/Reflex   C-reactive Protein     Recurrent acute suppurative otitis media without spontaneous rupture of left tympanic membrane       Relevant Medications   sulfamethoxazole-trimethoprim (BACTRIM DS) 800-160 MG tablet     I have ordered a poly arthralgia work up to rule out inflammatory arthritis, pt is already seeing Dr. Denyse Amass at Sports Medicine, I advised he schedule an appt with him about his knee and hand- the knee sounds like it might be pes anserine bursitis given its location.   His ear has an air fluid level behind the TM with what appears to be purulent fluid-- I will treat with another course of antibiotics-- if this does not improve his symptoms he will need a referral to ENT.  Meds ordered this encounter  Medications   sulfamethoxazole-trimethoprim (BACTRIM DS) 800-160 MG tablet    Sig: Take 1 tablet by mouth 2 (two) times daily for 7 days.    Dispense:  14 tablet    Refill:  0    No follow-ups on file.  Karie Georges,  MD

## 2023-07-09 ENCOUNTER — Other Ambulatory Visit (HOSPITAL_COMMUNITY): Payer: Self-pay

## 2023-07-09 ENCOUNTER — Other Ambulatory Visit: Payer: Self-pay | Admitting: Family Medicine

## 2023-07-09 DIAGNOSIS — G8929 Other chronic pain: Secondary | ICD-10-CM

## 2023-07-09 LAB — RHEUMATOID FACTOR: Rheumatoid fact SerPl-aCnc: <10 [IU]/mL (ref <14)

## 2023-07-11 ENCOUNTER — Other Ambulatory Visit (HOSPITAL_COMMUNITY): Payer: Self-pay

## 2023-07-11 MED ORDER — MELOXICAM 15 MG PO TABS
15.0000 mg | ORAL_TABLET | Freq: Every day | ORAL | 0 refills | Status: DC
  Filled 2023-07-19: qty 90, 90d supply, fill #0

## 2023-07-12 ENCOUNTER — Encounter: Payer: Self-pay | Admitting: Family Medicine

## 2023-07-12 LAB — LYME DISEASE SEROLOGY W/REFLEX

## 2023-07-12 LAB — ANA W/REFLEX: Anti Nuclear Antibody (ANA): NEGATIVE

## 2023-07-14 NOTE — Progress Notes (Signed)
 I, Leotis Batter, CMA acting as a scribe for Artist Lloyd, MD.    Adam Mcdonald is a 58 y.o. male who presents to [facility] at [facility] today for R knee pain. Pt was previously seen by Dr. Lloyd on 05/24/23 for his L knee.  Today, pt c/o R knee pain x 1-2 months, denies injury. Pt locates pain to medial aspect of the knee. Denies swelling. Mechanical sx present. Denies instability. Aching at nighttime, after more active days. Short-term relief with Voltaren.   R Knee swelling: no Mechanical symptoms: yes Aggravates: stairs, kneeling, ambulation Treatments tried: Tylenol  Arthritis, Voltaren Gel, compression  Pt also c.o left hand hand. Discomfort with gripping. Locates pain to the 2nd finger. Denies triggering. 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  He notes difficulty flexing that index finger and stiffness and pain especially felt in the MCP.  Dx testing: 07/08/23 Labs  Pertinent review of systems: No fevers or chills  Relevant historical information: Hypertension and diabetes.  Diabetes is well-controlled.   Exam:  BP (!) 150/86   Pulse 77   Ht 5' 7 (1.702 m)   Wt 236 lb (107 kg)   SpO2 97%   BMI 36.96 kg/m  General: Well Developed, well nourished, and in no acute distress.   MSK: Right knee minimal effusion.  Normal-appearing otherwise. Normal motion. Tender palpation medial joint line. Stable ligamentous exam.  Left hand well-developed musculature. Tender palpation at palmar second MCP. Decreased flexion at second PIP joint.  No definitive triggering is visible or present. Intact strength.    Lab and Radiology Results  Procedure: Real-time Ultrasound Guided Injection of right knee joint superior lateral patella space Device: Philips Affiniti 50G/GE Logiq Images permanently stored and available for review in PACS Verbal informed consent obtained.  Discussed risks and benefits of procedure. Warned about infection, bleeding, hyperglycemia damage to  structures among others. Patient expresses understanding and agreement Time-out conducted.   Noted no overlying erythema, induration, or other signs of local infection.   Skin prepped in a sterile fashion.   Local anesthesia: Topical Ethyl chloride.   With sterile technique and under real time ultrasound guidance: 40 mg of Kenalog and 2 mL of Marcaine injected into knee joint. Fluid seen entering the joint capsule.   Completed without difficulty   Pain immediately resolved suggesting accurate placement of the medication.   Advised to call if fevers/chills, erythema, induration, drainage, or persistent bleeding.   Images permanently stored and available for review in the ultrasound unit.  Impression: Technically successful ultrasound guided injection.   Procedure: Real-time Ultrasound Guided Injection of left second digit A1 pulley tendon sheath (trigger finger injection) Device: Philips Affiniti 50G/GE Logiq Images permanently stored and available for review in PACS Ultrasound evaluation prior to injection shows significant fluid surrounding tendon within tendon sheath left second A1 pulley area. Verbal informed consent obtained.  Discussed risks and benefits of procedure. Warned about infection, bleeding, hyperglycemia damage to structures among others. Patient expresses understanding and agreement Time-out conducted.   Noted no overlying erythema, induration, or other signs of local infection.   Skin prepped in a sterile fashion.   Local anesthesia: Topical Ethyl chloride.   With sterile technique and under real time ultrasound guidance: 40 mg of Kenalog and 1 mL of lidocaine  injected into tendon sheath at the A1 pulley. Fluid seen entering the tendon sheath.   Completed without difficulty   Pain immediately resolved suggesting accurate placement of the medication.   Advised to call  if fevers/chills, erythema, induration, drainage, or persistent bleeding.   Images permanently stored and  available for review in the ultrasound unit.  Impression: Technically successful ultrasound guided injection.    X-ray images right knee obtained today personally and independently interpreted. Mild medial and patellofemoral DJD. Await formal radiology review     Assessment and Plan: 58 y.o. 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  Plan for steroid injection today.  Continue quad strengthening exercises.  Left hand trigger finger.  Plan for injection and double Band-Aid splint.  Recheck as needed.   PDMP not reviewed this encounter. Orders Placed This Encounter  Procedures   [place]  LIMITED JOINT SPACE STRUCTURES LOW LEFT(NO LINKED CHARGES)    Reason for Exam (SYMPTOM  OR DIAGNOSIS REQUIRED):   left knee pain    Preferred imaging location?:   [facility] Knee AP/LAT W/[place] Right    Standing Status:   Future    Number of Occurrences:   1    Expiration Date:   08/12/2023    Reason for Exam (SYMPTOM  OR DIAGNOSIS REQUIRED):   right knee pain    Preferred imaging location?:   [facility] [place]   No orders of the defined types were placed in this encounter.    Discussed warning signs or symptoms. Please see discharge instructions. Patient expresses understanding.   The above documentation has been reviewed and is accurate and complete Artist Lloyd, M.D.

## 2023-07-15 ENCOUNTER — Ambulatory Visit (INDEPENDENT_AMBULATORY_CARE_PROVIDER_SITE_OTHER): Payer: Commercial Managed Care - PPO

## 2023-07-15 ENCOUNTER — Other Ambulatory Visit: Payer: Self-pay

## 2023-07-15 ENCOUNTER — Ambulatory Visit: Payer: Commercial Managed Care - PPO | Admitting: Family Medicine

## 2023-07-15 ENCOUNTER — Encounter: Payer: Self-pay | Admitting: Family Medicine

## 2023-07-15 VITALS — BP 150/86 | HR 77 | Ht 67.0 in | Wt 236.0 lb

## 2023-07-15 DIAGNOSIS — G8929 Other chronic pain: Secondary | ICD-10-CM | POA: Diagnosis not present

## 2023-07-15 DIAGNOSIS — M65322 Trigger finger, left index finger: Secondary | ICD-10-CM | POA: Diagnosis not present

## 2023-07-15 DIAGNOSIS — M25561 Pain in right knee: Secondary | ICD-10-CM

## 2023-07-15 DIAGNOSIS — M25461 Effusion, right knee: Secondary | ICD-10-CM | POA: Diagnosis not present

## 2023-07-15 DIAGNOSIS — M25562 Pain in left knee: Secondary | ICD-10-CM | POA: Diagnosis not present

## 2023-07-15 NOTE — Patient Instructions (Addendum)
 Thank you for coming in today.  You received an injection today. Seek immediate medical attention if the joint becomes red, extremely painful, or is oozing fluid.  Please get an Xray today before you leave  Use the double bandaid splint for about 2 weeks.   Recheck as needed,

## 2023-07-16 DIAGNOSIS — G4733 Obstructive sleep apnea (adult) (pediatric): Secondary | ICD-10-CM | POA: Diagnosis not present

## 2023-07-19 ENCOUNTER — Other Ambulatory Visit (HOSPITAL_COMMUNITY): Payer: Self-pay

## 2023-07-21 ENCOUNTER — Other Ambulatory Visit (HOSPITAL_COMMUNITY): Payer: Self-pay

## 2023-07-28 ENCOUNTER — Encounter: Payer: Self-pay | Admitting: Family Medicine

## 2023-07-28 NOTE — Progress Notes (Signed)
Right knee x-ray shows a bone spur at the quadricep and patellar tendons.

## 2023-08-10 ENCOUNTER — Other Ambulatory Visit: Payer: Self-pay | Admitting: Family Medicine

## 2023-08-10 DIAGNOSIS — G4700 Insomnia, unspecified: Secondary | ICD-10-CM

## 2023-08-11 ENCOUNTER — Other Ambulatory Visit (HOSPITAL_COMMUNITY): Payer: Self-pay

## 2023-08-11 MED ORDER — AMITRIPTYLINE HCL 25 MG PO TABS
25.0000 mg | ORAL_TABLET | Freq: Every day | ORAL | 1 refills | Status: DC
  Filled 2023-08-11: qty 90, 90d supply, fill #0
  Filled 2023-11-10: qty 90, 90d supply, fill #1

## 2023-08-13 DIAGNOSIS — G4733 Obstructive sleep apnea (adult) (pediatric): Secondary | ICD-10-CM | POA: Diagnosis not present

## 2023-08-19 ENCOUNTER — Encounter: Payer: Commercial Managed Care - PPO | Admitting: Family Medicine

## 2023-09-13 DIAGNOSIS — G4733 Obstructive sleep apnea (adult) (pediatric): Secondary | ICD-10-CM | POA: Diagnosis not present

## 2023-09-23 ENCOUNTER — Encounter: Payer: Self-pay | Admitting: Family Medicine

## 2023-09-28 DIAGNOSIS — G4733 Obstructive sleep apnea (adult) (pediatric): Secondary | ICD-10-CM | POA: Diagnosis not present

## 2023-10-13 ENCOUNTER — Other Ambulatory Visit: Payer: Self-pay

## 2023-10-13 ENCOUNTER — Other Ambulatory Visit (HOSPITAL_COMMUNITY): Payer: Self-pay

## 2023-10-13 ENCOUNTER — Other Ambulatory Visit: Payer: Self-pay | Admitting: Family Medicine

## 2023-10-13 DIAGNOSIS — M545 Low back pain, unspecified: Secondary | ICD-10-CM

## 2023-10-13 MED ORDER — MELOXICAM 15 MG PO TABS
15.0000 mg | ORAL_TABLET | Freq: Every day | ORAL | 0 refills | Status: DC
  Filled 2023-10-13: qty 90, 90d supply, fill #0

## 2023-10-27 NOTE — Progress Notes (Signed)
   Joanna Muck, PhD, LAT, ATC acting as a scribe for Garlan Juniper, MD.  Adam Mcdonald is a 58 y.o. male who presents to [facility] at [facility] today for re-occurring R knee pain. Pt was last seen by Dr. Alease Hunter on 07/15/23 and was given a R knee steroid injection and advised to cont quad strengthening.  Today, pt reports R knee pain returned around the beginning of May. Last week R knee pain worsened. He is wondering about timing of the steroid injection.   Dx imaging: 07/15/23 R knee XR  Pertinent review of systems: No fevers or chills  Relevant historical information: Diabetes, hypertension   Exam:  BP (!) 150/86   Pulse 66   Ht 5\' 7"  (1.702 m)   Wt 231 lb (104.8 kg)   SpO2 98%   BMI 36.18 kg/m  General: Well Developed, well nourished, and in no acute distress.   MSK: Right knee mild effusion normal.  Otherwise normal motion.    Lab and Radiology Results  Procedure: Real-time Ultrasound Guided Injection of right knee joint superior lateral patella space Device: Philips Affiniti 50G/GE Logiq Images permanently stored and available for review in PACS Verbal informed consent obtained.  Discussed risks and benefits of procedure. Warned about infection, bleeding, hyperglycemia damage to structures among others. Patient expresses understanding and agreement Time-out conducted.   Noted no overlying erythema, induration, or other signs of local infection.   Skin prepped in a sterile fashion.   Local anesthesia: Topical Ethyl chloride.   With sterile technique and under real time ultrasound guidance: 40 mg of Kenalog and 2 mL of Marcaine injected into knee joint. Fluid seen entering the joint capsule.   Completed without difficulty   Pain immediately resolved suggesting accurate placement of the medication.   Advised to call if fevers/chills, erythema, induration, drainage, or persistent bleeding.   Images permanently stored and available for review in the  ultrasound unit.  Impression: Technically successful ultrasound guided injection.        Assessment and Plan: 58 y.o. male with chronic right knee pain with recurrence.  Pain is located medially and due to a degenerative meniscus tear or worse arthritis and is visible on x-ray.  Plan for repeat steroid injection.  If this injection about every 3 months becomes a trend consider MRI.   PDMP not reviewed this encounter. Orders Placed This Encounter  Procedures   US  LIMITED JOINT SPACE STRUCTURES LOW RIGHT(NO LINKED CHARGES)    Reason for Exam (SYMPTOM  OR DIAGNOSIS REQUIRED):   right knee pain    Preferred imaging location?:   [facility]   No orders of the defined types were placed in this encounter.    Discussed warning signs or symptoms. Please see discharge instructions. Patient expresses understanding.   The above documentation has been reviewed and is accurate and complete Garlan Juniper, M.D.

## 2023-10-28 ENCOUNTER — Other Ambulatory Visit: Payer: Self-pay

## 2023-10-28 ENCOUNTER — Ambulatory Visit: Admitting: Family Medicine

## 2023-10-28 VITALS — BP 150/86 | HR 66 | Ht 67.0 in | Wt 231.0 lb

## 2023-10-28 DIAGNOSIS — G8929 Other chronic pain: Secondary | ICD-10-CM

## 2023-10-28 DIAGNOSIS — M25561 Pain in right knee: Secondary | ICD-10-CM | POA: Diagnosis not present

## 2023-10-28 NOTE — Patient Instructions (Addendum)
 Thank you for coming in today.   You received an injection today. Seek immediate medical attention if the joint becomes red, extremely painful, or is oozing fluid.

## 2023-11-03 ENCOUNTER — Other Ambulatory Visit: Payer: Self-pay | Admitting: Family Medicine

## 2023-11-03 DIAGNOSIS — F321 Major depressive disorder, single episode, moderate: Secondary | ICD-10-CM

## 2023-11-03 DIAGNOSIS — I10 Essential (primary) hypertension: Secondary | ICD-10-CM

## 2023-11-04 ENCOUNTER — Other Ambulatory Visit (HOSPITAL_COMMUNITY): Payer: Self-pay

## 2023-11-04 MED ORDER — BUPROPION HCL ER (XL) 300 MG PO TB24
300.0000 mg | ORAL_TABLET | Freq: Every day | ORAL | 1 refills | Status: DC
  Filled 2023-11-04: qty 90, 90d supply, fill #0
  Filled 2024-02-03: qty 90, 90d supply, fill #1

## 2023-11-04 MED ORDER — DESVENLAFAXINE SUCCINATE ER 50 MG PO TB24
50.0000 mg | ORAL_TABLET | Freq: Every day | ORAL | 1 refills | Status: DC
  Filled 2023-11-04: qty 90, 90d supply, fill #0
  Filled 2024-02-03: qty 90, 90d supply, fill #1

## 2023-11-04 MED ORDER — AMLODIPINE BESYLATE 2.5 MG PO TABS
2.5000 mg | ORAL_TABLET | Freq: Every day | ORAL | 1 refills | Status: DC
  Filled 2023-11-04: qty 90, 90d supply, fill #0
  Filled 2024-02-03: qty 90, 90d supply, fill #1

## 2023-11-04 MED ORDER — LOSARTAN POTASSIUM-HCTZ 100-25 MG PO TABS
1.0000 | ORAL_TABLET | Freq: Every day | ORAL | 1 refills | Status: DC
  Filled 2023-11-04: qty 90, 90d supply, fill #0
  Filled 2024-02-03: qty 90, 90d supply, fill #1

## 2023-11-15 ENCOUNTER — Emergency Department (HOSPITAL_COMMUNITY)
Admission: EM | Admit: 2023-11-15 | Discharge: 2023-11-15 | Disposition: A | Discharge status: Home / Self Care | Attending: Emergency Medicine | Admitting: Emergency Medicine

## 2023-11-15 ENCOUNTER — Other Ambulatory Visit (HOSPITAL_COMMUNITY): Payer: Self-pay

## 2023-11-15 ENCOUNTER — Other Ambulatory Visit: Payer: Self-pay

## 2023-11-15 ENCOUNTER — Encounter (HOSPITAL_COMMUNITY): Payer: Self-pay

## 2023-11-15 ENCOUNTER — Emergency Department (HOSPITAL_COMMUNITY)

## 2023-11-15 DIAGNOSIS — I10 Essential (primary) hypertension: Secondary | ICD-10-CM | POA: Insufficient documentation

## 2023-11-15 DIAGNOSIS — Z79899 Other long term (current) drug therapy: Secondary | ICD-10-CM | POA: Diagnosis not present

## 2023-11-15 DIAGNOSIS — Z0389 Encounter for observation for other suspected diseases and conditions ruled out: Secondary | ICD-10-CM | POA: Diagnosis not present

## 2023-11-15 DIAGNOSIS — S61442A Puncture wound with foreign body of left hand, initial encounter: Secondary | ICD-10-CM | POA: Insufficient documentation

## 2023-11-15 DIAGNOSIS — R7981 Abnormal blood-gas level: Secondary | ICD-10-CM | POA: Insufficient documentation

## 2023-11-15 DIAGNOSIS — S6992XA Unspecified injury of left wrist, hand and finger(s), initial encounter: Secondary | ICD-10-CM | POA: Diagnosis present

## 2023-11-15 DIAGNOSIS — W458XXA Other foreign body or object entering through skin, initial encounter: Secondary | ICD-10-CM | POA: Diagnosis not present

## 2023-11-15 DIAGNOSIS — E119 Type 2 diabetes mellitus without complications: Secondary | ICD-10-CM | POA: Diagnosis not present

## 2023-11-15 DIAGNOSIS — Y990 Civilian activity done for income or pay: Secondary | ICD-10-CM | POA: Insufficient documentation

## 2023-11-15 DIAGNOSIS — Z23 Encounter for immunization: Secondary | ICD-10-CM | POA: Diagnosis not present

## 2023-11-15 DIAGNOSIS — S60552A Superficial foreign body of left hand, initial encounter: Secondary | ICD-10-CM

## 2023-11-15 MED ORDER — IBUPROFEN 600 MG PO TABS
600.0000 mg | ORAL_TABLET | Freq: Four times a day (QID) | ORAL | 0 refills | Status: DC | PRN
  Filled 2023-11-15: qty 30, 8d supply, fill #0

## 2023-11-15 MED ORDER — HYDROCODONE-ACETAMINOPHEN 5-325 MG PO TABS
1.0000 | ORAL_TABLET | Freq: Once | ORAL | Status: AC
  Administered 2023-11-15: 1 via ORAL
  Filled 2023-11-15: qty 1

## 2023-11-15 MED ORDER — OXYCODONE HCL 5 MG PO TABS
5.0000 mg | ORAL_TABLET | ORAL | 0 refills | Status: DC | PRN
  Filled 2023-11-15: qty 10, 2d supply, fill #0

## 2023-11-15 MED ORDER — TETANUS-DIPHTH-ACELL PERTUSSIS 5-2.5-18.5 LF-MCG/0.5 IM SUSY
0.5000 mL | PREFILLED_SYRINGE | Freq: Once | INTRAMUSCULAR | Status: AC
  Administered 2023-11-15: 0.5 mL via INTRAMUSCULAR
  Filled 2023-11-15: qty 0.5

## 2023-11-15 MED ORDER — ACETAMINOPHEN 325 MG PO TABS
650.0000 mg | ORAL_TABLET | Freq: Four times a day (QID) | ORAL | 0 refills | Status: DC | PRN
  Filled 2023-11-15: qty 36, 5d supply, fill #0

## 2023-11-15 MED ORDER — CEFADROXIL 500 MG PO CAPS
500.0000 mg | ORAL_CAPSULE | Freq: Two times a day (BID) | ORAL | 0 refills | Status: AC
  Filled 2023-11-15: qty 10, 5d supply, fill #0

## 2023-11-15 NOTE — Discharge Instructions (Addendum)
 It was a pleasure caring for you today in the emergency department.  Keep the original bandage on the wound for next 12-24 hours, can remove if becomes soiled and replace with clean bandage. Wash the wound with soap/water 2x daily until wound has closed up. Also apply a thin layer of bacitracin after cleaning/drying the wound. If you notice worsening pain, redness, pus draining from the wound or any worsening/worrisome symptoms please return for recheck.  Avoid submersion of the wound underwater until healed, no hot tubs/bath tubs/lakes/swimming pools until healed. If you wash dishes recommend use thick rubber gloves.   Recommend light duty next 5 days at work  Please return to the emergency department for any worsening or worrisome symptoms.

## 2023-11-15 NOTE — ED Triage Notes (Signed)
 Pt doing maintenance in ED and impaled thermometer in left hand. Pt arrives with thermometer still in hand. No bleeding, Pt able to move all fingers, denies tingling or numbness beyond injury.

## 2023-11-15 NOTE — ED Provider Notes (Signed)
 [place] EMERGENCY DEPARTMENT AT [facility] Provider Note  CSN: [id number] Arrival date & time: 11/15/23 4098  Chief Complaint(s) Hand Injury  HPI Adam Mcdonald is a 58 y.o. male with past medical history as below, significant for HTN, DM2 who presents to the ED with complaint of fb stuck in hand  Pt was at work when a thermometer inadvertently became impaled into his left hand. He is LHD. He attempted to remove the object but felt it was stuck. He has full ROM to his fingers and no sensation loss. No other injuries reported. Unsure last tetanus.   Past Medical History Past Medical History:  Diagnosis Date   Chicken pox    Chronic pain    Depression    History of MRSA infection    Hypertension    Sleep apnea    Sleep apnea    Ventral hernia without obstruction or gangrene    Vitamin D  deficiency    Patient Active Problem List   Diagnosis Date Noted   Prepatellar bursitis of left knee 05/19/2023   Depression, major, single episode, moderate (HCC) 09/30/2022   Diabetes mellitus type II, controlled (HCC) 12/15/2020   Obstructive sleep apnea 11/24/2020   Hypertension 08/31/2019   Low back pain 06/23/2018   Vitamin D  deficiency 06/02/2018   Home Medication(s) Prior to Admission medications   Medication Sig Start Date End Date Taking? Authorizing Provider  acetaminophen  (TYLENOL ) 325 MG tablet Take 2 tablets (650 mg total) by mouth every 6 (six) hours as needed. 11/15/23  Yes Russella Courts A, DO  cefadroxil (DURICEF) 500 MG capsule Take 1 capsule (500 mg total) by mouth 2 (two) times daily for 5 days. 11/15/23 11/20/23 Yes Russella Courts A, DO  ibuprofen (ADVIL) 600 MG tablet Take 1 tablet (600 mg total) by mouth every 6 (six) hours as needed. 11/15/23  Yes Teddi Favors, DO  oxyCODONE  (ROXICODONE ) 5 MG immediate release tablet Take 1 tablet (5 mg total) by mouth every 4 (four) hours as needed for severe pain (pain score 7-10). 11/15/23  Yes Teddi Favors, DO   amitriptyline  (ELAVIL ) 25 MG tablet Take 1 tablet (25 mg) by mouth at bedtime. Stop rozerem . 08/11/23   Aida House, MD  amLODipine  (NORVASC ) 2.5 MG tablet Take 1 tablet (2.5 mg total) by mouth daily. 11/04/23   Aida House, MD  Aspirin-Acetaminophen -Caffeine (EXCEDRIN PO) Take by mouth.    [provider]  buPROPion  (WELLBUTRIN  XL) 300 MG 24 hr tablet Take 1 tablet (300 mg total) by mouth daily. 11/04/23   Aida House, MD  ciclopirox  (PENLAC ) 8 % solution Apply topically at bedtime. Apply over nail and surrounding skin. Apply daily over previous coat. After seven (7) days, may remove with alcohol and continue cycle. 01/20/22   Aida House, MD  COVID-19 mRNA vaccine [phone] (COMIRNATY ) syringe Inject into the muscle. 06/11/22   Liane Redman, MD  desvenlafaxine  (PRISTIQ ) 50 MG 24 hr tablet Take 1 tablet by mouth daily. 11/04/23   Aida House, MD  diphenhydrAMINE-APAP, sleep, (TYLENOL  PM EXTRA STRENGTH PO) Take by mouth as needed.    [provider]  losartan -hydrochlorothiazide  (HYZAAR ) 100-25 MG tablet Take 1 tablet by mouth daily. 11/04/23   Aida House, MD  Loteprednol -Tobramycin  (ZYLET ) 0.5-0.3 % SUSP Instill 1 drop into left eye three times a day 04/19/22     meloxicam  (MOBIC ) 15 MG tablet Take 1 tablet (15 mg total) by mouth daily. 10/13/23   Aida House, MD  Multiple Vitamin (MULTI-VITAMIN DAILY PO) Take by mouth.    [provider]  Omega 3 1200 MG CAPS Take by mouth.    [provider]  rosuvastatin  (CRESTOR ) 10 MG tablet Take 1 tablet (10 mg total) by mouth daily. 05/13/23   Aida House, MD  tirzepatide  (MOUNJARO ) 5 MG/0.5ML Pen Inject 5 mg into the skin once a week. 05/13/23   Aida House, MD  VITAMIN D  PO Take 5,000 Units by mouth daily.    [provider]                                                                                                                                     Past Surgical History Past Surgical History:  Procedure Laterality Date   ACHILLES TENDON REPAIR Left 02/16/2019   UVULOPALATOPHARYNGOPLASTY     Family History Family History  Problem Relation Age of Onset   Diabetes Mother    Depression Mother    High blood pressure Father    Stroke Maternal Grandfather    Heart attack Paternal Grandmother 80   Heart attack Paternal Grandfather 13    Social History Social History   Tobacco Use   Smoking status: Never   Smokeless tobacco: Never  Substance Use Topics   Alcohol use: Not Currently    Comment: nothing since June 2021   Drug use: Never   Allergies Other and Trazodone and nefazodone  Review of Systems A thorough review of systems was obtained and all systems are negative except as noted in the HPI and PMH.   Physical Exam Vital Signs  I have reviewed the triage vital signs BP (!) 131/105   Pulse 74   Temp 97.6 F (36.4 C) (Oral)   Resp 18   SpO2 92%  Physical Exam Vitals and nursing note reviewed.  Constitutional:      General: He is not in acute distress.    Appearance: Normal appearance. He is well-developed. He is not ill-appearing.  HENT:     Head: Normocephalic and atraumatic.     Right Ear: External ear normal.     Left Ear: External ear normal.     Nose: Nose normal.     Mouth/Throat:     Mouth: Mucous membranes are moist.  Eyes:     General: No scleral icterus.       Right eye: No discharge.        Left eye: No discharge.  Cardiovascular:     Rate and Rhythm: Normal rate.  Pulmonary:     Effort: Pulmonary effort is normal. No respiratory distress.     Breath sounds: No stridor.  Abdominal:     General: Abdomen is flat. There is no distension.     Tenderness: There is no guarding.  Musculoskeletal:        General: No deformity.     Cervical back: No rigidity.  Comments: Fb in left hand, see photo Cap refill to fingertips is brisk Sensation intact to fingertips No active bleeding  Radial  pulse brisk  Skin:    General: Skin is warm and dry.     Coloration: Skin is not cyanotic, jaundiced or pale.  Neurological:     Mental Status: He is alert and oriented to person, place, and time.     GCS: GCS eye subscore is 4. GCS verbal subscore is 5. GCS motor subscore is 6.  Psychiatric:        Speech: Speech normal.        Behavior: Behavior normal. Behavior is cooperative.     ED Results and Treatments Labs (all labs ordered are listed, but only abnormal results are displayed) Labs Reviewed - No data to display                                                                                                                        Radiology DG Hand Complete Left Result Date: 11/15/2023 CLINICAL DATA:  FT removal EXAM: LEFT HAND - COMPLETE 3 VIEW COMPARISON:  None Available. FINDINGS: There is no evidence of fracture or dislocation. There is no evidence of arthropathy or other focal bone abnormality. Soft tissues are unremarkable. IMPRESSION: Negative. Electronically Signed   By: Sydell Eva M.D.   On: 11/15/2023 10:08   DG Hand 2 View Left Result Date: 11/15/2023 CLINICAL DATA:  fb EXAM: LEFT HAND - 2 VIEW COMPARISON:  January 22, 2020 FINDINGS: Radiopaque foreign body (metallic thermometer) impaling the soft tissues between the fourth and fifth metacarpal heads. The metallic shaft comes in close approximation of the fourth metacarpal head no acute fracture or dislocation. There is no evidence of arthropathy or other focal bone abnormality. Soft tissues are unremarkable. IMPRESSION: 1. Radiopaque foreign body impaling the soft tissues between the fourth and fifth metacarpal heads. The metallic shaft of the foreign body comes in close approximation with the fourth metacarpal head and it is difficult to ascertain whether this breaches the bony cortex. Oblique radiographs could be considered for further characterization. 2. No acute fracture or dislocation visualized. Electronically  Signed   By: Rance Burrows M.D.   On: 11/15/2023 09:23    Pertinent labs & imaging results that were available during my care of the patient were reviewed by me and considered in my medical decision making (see MDM for details).  Medications Ordered in ED Medications  HYDROcodone-acetaminophen  (NORCO/VICODIN) 5-325 MG per tablet 1 tablet (1 tablet Oral Given 11/15/23 0931)  Tdap (BOOSTRIX) injection 0.5 mL (0.5 mLs Intramuscular Given 11/15/23 0934)  Procedures .Foreign Body Removal  Date/Time: 11/15/2023 10:16 AM  Performed by: Teddi Favors, DO Authorized by: Teddi Favors, DO  Consent: Verbal consent obtained. Risks and benefits: risks, benefits and alternatives were discussed Consent given by: patient Patient understanding: patient states understanding of the procedure being performed Imaging studies: imaging studies available Required items: required blood products, implants, devices, and special equipment available Patient identity confirmed: verbally with patient and arm band Time out: Immediately prior to procedure a "time out" was called to verify the correct patient, procedure, equipment, support staff and site/side marked as required. Body area: skin General location: upper extremity Location details: left hand  Sedation: Patient sedated: no  Patient restrained: no Patient cooperative: yes Localization method: visualized and serial x-rays Removal mechanism: by hand. Dressing: antibiotic ointment and dressing applied Tendon involvement: none Depth: deep Complexity: simple 1 objects recovered. Objects recovered: thermometer Post-procedure assessment: foreign body removed Patient tolerance: patient tolerated the procedure well with no immediate complications    (including critical care time)  Medical Decision Making / ED  Course    Medical Decision Making:    Tristan Proto is a 58 y.o. male w/ hx as above here with fb to left hand. The complaint involves an extensive differential diagnosis and also carries with it a high risk of complications and morbidity.  Serious etiology was considered. Ddx includes but is not limited to: fb, fx, dislocation, tendon injury, ligament injury, laceration, etc  Complete initial physical exam performed, notably the patient was in no distress.    Reviewed and confirmed nursing documentation for past medical history, family history, social history.  Vital signs reviewed.     Brief summary:  57 yo/m w/ hx as above here with fb to left hand LHD Unsure tetanus Give analgesia, get XR, plan to remove fb and clean wound. Will also update tetanus    Clinical Course as of 11/15/23 1022  Tue Nov 15, 2023  0911 SpO2(!): 87 % Error, poor pleth [SG]  1015 Rpt hand xr w/o overt fx, no retained fb [SG]    Clinical Course User Index [SG] Teddi Favors, DO    Object removed LUE is NVI Bleeding well controlled, does not require closure Wound care performed Give analgesics/abx for home Wound care instructions  Patient in no distress and overall condition improved here in the ED. Detailed discussions were had with the patient/guardian regarding current findings, and need for close f/u with PCP or on call doctor. The patient/guardian has been instructed to return immediately if the symptoms worsen in any way for re-evaluation. Patient/guardian verbalized understanding and is in agreement with current care plan. All questions answered prior to discharge.              Additional history obtained: -Additional history obtained from na -External records from outside source obtained and reviewed including: Chart review including previous notes, labs, imaging, consultation notes including  PDMP, home meds    Lab Tests: na  EKG   EKG Interpretation Date/Time:     Ventricular Rate:    PR Interval:    QRS Duration:    QT Interval:    QTC Calculation:   R Axis:      Text Interpretation:           Imaging Studies ordered: I ordered imaging studies including hand xr I independently visualized the following imaging with scope of interpretation limited to determining acute life threatening conditions related to emergency care; findings noted above I agree with  the radiologist interpretation If any imaging was obtained with contrast I closely monitored patient for any possible adverse reaction a/w contrast administration in the emergency department   Medicines ordered and prescription drug management: Meds ordered this encounter  Medications   HYDROcodone-acetaminophen  (NORCO/VICODIN) 5-325 MG per tablet 1 tablet    Refill:  0   Tdap (BOOSTRIX) injection 0.5 mL   oxyCODONE  (ROXICODONE ) 5 MG immediate release tablet    Sig: Take 1 tablet (5 mg total) by mouth every 4 (four) hours as needed for severe pain (pain score 7-10).    Dispense:  10 tablet    Refill:  0   acetaminophen  (TYLENOL ) 325 MG tablet    Sig: Take 2 tablets (650 mg total) by mouth every 6 (six) hours as needed.    Dispense:  36 tablet    Refill:  0   ibuprofen (ADVIL) 600 MG tablet    Sig: Take 1 tablet (600 mg total) by mouth every 6 (six) hours as needed.    Dispense:  30 tablet    Refill:  0   cefadroxil (DURICEF) 500 MG capsule    Sig: Take 1 capsule (500 mg total) by mouth 2 (two) times daily for 5 days.    Dispense:  10 capsule    Refill:  0    -I have reviewed the patients home medicines and have made adjustments as needed   Consultations Obtained: na   Cardiac Monitoring: Continuous pulse oximetry interpreted by myself, 95% on RA.    Social Determinants of Health:  Diagnosis or treatment significantly limited by social determinants of health: na   Reevaluation: After the interventions noted above, I reevaluated the patient and found that they have  improved  Co morbidities that complicate the patient evaluation  Past Medical History:  Diagnosis Date   Chicken pox    Chronic pain    Depression    History of MRSA infection    Hypertension    Sleep apnea    Sleep apnea    Ventral hernia without obstruction or gangrene    Vitamin D  deficiency       Dispostion: Disposition decision including need for hospitalization was considered, and patient discharged from emergency department.    Final Clinical Impression(s) / ED Diagnoses Final diagnoses:  Foreign body of left hand, initial encounter        Teddi Favors, DO 11/15/23 1022

## 2023-11-16 ENCOUNTER — Ambulatory Visit: Admitting: Family Medicine

## 2023-11-16 ENCOUNTER — Other Ambulatory Visit (HOSPITAL_COMMUNITY): Payer: Self-pay

## 2023-11-16 ENCOUNTER — Encounter: Payer: Self-pay | Admitting: Family Medicine

## 2023-11-16 VITALS — BP 100/60 | HR 63 | Temp 97.9°F | Ht 68.5 in | Wt 227.6 lb

## 2023-11-16 DIAGNOSIS — Z7985 Long-term (current) use of injectable non-insulin antidiabetic drugs: Secondary | ICD-10-CM | POA: Diagnosis not present

## 2023-11-16 DIAGNOSIS — Z0000 Encounter for general adult medical examination without abnormal findings: Secondary | ICD-10-CM

## 2023-11-16 DIAGNOSIS — E1169 Type 2 diabetes mellitus with other specified complication: Secondary | ICD-10-CM | POA: Diagnosis not present

## 2023-11-16 LAB — POCT GLYCOSYLATED HEMOGLOBIN (HGB A1C): Hemoglobin A1C: 5.3 % (ref 4.0–5.6)

## 2023-11-16 MED ORDER — MOUNJARO 7.5 MG/0.5ML ~~LOC~~ SOAJ
7.5000 mg | SUBCUTANEOUS | 5 refills | Status: DC
  Filled 2023-11-16: qty 2, 28d supply, fill #0
  Filled 2023-12-10: qty 2, 28d supply, fill #1
  Filled 2024-01-07: qty 2, 28d supply, fill #2
  Filled 2024-02-06: qty 2, 28d supply, fill #3
  Filled 2024-03-02: qty 2, 28d supply, fill #4
  Filled 2024-04-02: qty 2, 28d supply, fill #5

## 2023-11-16 NOTE — Patient Instructions (Signed)
 Health Maintenance, Male  Adopting a healthy lifestyle and getting preventive care are important in promoting health and wellness. Ask your health care provider about:  The right schedule for you to have regular tests and exams.  Things you can do on your own to prevent diseases and keep yourself healthy.  What should I know about diet, weight, and exercise?  Eat a healthy diet    Eat a diet that includes plenty of vegetables, fruits, low-fat dairy products, and lean protein.  Do not eat a lot of foods that are high in solid fats, added sugars, or sodium.  Maintain a healthy weight  Body mass index (BMI) is a measurement that can be used to identify possible weight problems. It estimates body fat based on height and weight. Your health care provider can help determine your BMI and help you achieve or maintain a healthy weight.  Get regular exercise  Get regular exercise. This is one of the most important things you can do for your health. Most adults should:  Exercise for at least 150 minutes each week. The exercise should increase your heart rate and make you sweat (moderate-intensity exercise).  Do strengthening exercises at least twice a week. This is in addition to the moderate-intensity exercise.  Spend less time sitting. Even light physical activity can be beneficial.  Watch cholesterol and blood lipids  Have your blood tested for lipids and cholesterol at 56 years of age, then have this test every 5 years.  You may need to have your cholesterol levels checked more often if:  Your lipid or cholesterol levels are high.  You are older than 56 years of age.  You are at high risk for heart disease.  What should I know about cancer screening?  Many types of cancers can be detected early and may often be prevented. Depending on your health history and family history, you may need to have cancer screening at various ages. This may include screening for:  Colorectal cancer.  Prostate cancer.  Skin cancer.  Lung  cancer.  What should I know about heart disease, diabetes, and high blood pressure?  Blood pressure and heart disease  High blood pressure causes heart disease and increases the risk of stroke. This is more likely to develop in people who have high blood pressure readings or are overweight.  Talk with your health care provider about your target blood pressure readings.  Have your blood pressure checked:  Every 3-5 years if you are 9-95 years of age.  Every year if you are 85 years old or older.  If you are between the ages of 29 and 29 and are a current or former smoker, ask your health care provider if you should have a one-time screening for abdominal aortic aneurysm (AAA).  Diabetes  Have regular diabetes screenings. This checks your fasting blood sugar level. Have the screening done:  Once every three years after age 23 if you are at a normal weight and have a low risk for diabetes.  More often and at a younger age if you are overweight or have a high risk for diabetes.  What should I know about preventing infection?  Hepatitis B  If you have a higher risk for hepatitis B, you should be screened for this virus. Talk with your health care provider to find out if you are at risk for hepatitis B infection.  Hepatitis C  Blood testing is recommended for:  Everyone born from [birth year] through 1963.  Anyone  with known risk factors for hepatitis C.  Sexually transmitted infections (STIs)  You should be screened each year for STIs, including gonorrhea and chlamydia, if:  You are sexually active and are younger than 56 years of age.  You are older than 56 years of age and your health care provider tells you that you are at risk for this type of infection.  Your sexual activity has changed since you were last screened, and you are at increased risk for chlamydia or gonorrhea. Ask your health care provider if you are at risk.  Ask your health care provider about whether you are at high risk for HIV. Your health care provider  may recommend a prescription medicine to help prevent HIV infection. If you choose to take medicine to prevent HIV, you should first get tested for HIV. You should then be tested every 3 months for as long as you are taking the medicine.  Follow these instructions at home:  Alcohol use  Do not drink alcohol if your health care provider tells you not to drink.  If you drink alcohol:  Limit how much you have to 0-2 drinks a day.  Know how much alcohol is in your drink. In the [place] one drink equals one 12 oz bottle of beer (355 mL), one 5 oz glass of wine (148 mL), or one 1 oz glass of hard liquor (44 mL).  Lifestyle  Do not use any products that contain nicotine or tobacco. These products include cigarettes, chewing tobacco, and vaping devices, such as e-cigarettes. If you need help quitting, ask your health care provider.  Do not use street drugs.  Do not share needles.  Ask your health care provider for help if you need support or information about quitting drugs.  General instructions  Schedule regular health, dental, and eye exams.  Stay current with your vaccines.  Tell your health care provider if:  You often feel depressed.  You have ever been abused or do not feel safe at home.  Summary  Adopting a healthy lifestyle and getting preventive care are important in promoting health and wellness.  Follow your health care provider's instructions about healthy diet, exercising, and getting tested or screened for diseases.  Follow your health care provider's instructions on monitoring your cholesterol and blood pressure.  This information is not intended to replace advice given to you by your health care provider. Make sure you discuss any questions you have with your health care provider.  Document Revised: 10/13/2020 Document Reviewed: 10/13/2020  Elsevier Patient Education  2024 [employer].

## 2023-11-16 NOTE — Progress Notes (Signed)
 Complete physical exam  Patient: Adam Mcdonald   DOB: Oct 24, 1965   58 y.o. Male  MRN: [id number]  Subjective:     Chief Complaint  Patient presents with   Annual Exam    Adam Mcdonald is a 58 y.o. male who presents today for a complete physical exam. He reports consuming a general diet. Exercise is limited by orthopedic condition(s): chronic knee pain, sees sports medicine for this. He does get a lot of walking during his job. He generally feels well. He reports sleeping well. He does not have additional problems to discuss today.    Most recent fall risk assessment:    10/16/2021    8:00 AM  Fall Risk   Falls in the past year? 0  Number falls in past yr: 0  Injury with Fall? 0  Risk for fall due to : No Fall Risks  Follow up Falls evaluation completed     Most recent depression screenings:    11/16/2023    8:23 AM 05/13/2023   12:54 PM  PHQ 2/9 Scores  PHQ - 2 Score 1 3  PHQ- 9 Score 2 4    Vision:Within last year and Dental: No current dental problems and Last dental visit: 2 years ago  Patient Active Problem List   Diagnosis Date Noted   Prepatellar bursitis of left knee 05/19/2023   Depression, major, single episode, moderate (HCC) 09/30/2022   Diabetes mellitus type II, controlled (HCC) 12/15/2020   Obstructive sleep apnea 11/24/2020   Hypertension 08/31/2019   Low back pain 06/23/2018   Vitamin D  deficiency 06/02/2018      Patient Care Team: Aida House, MD as PCP - General (Family Medicine) Sonny Dust, MD (Inactive) as PCP - Cardiology (Cardiology)   Outpatient Medications Prior to Visit  Medication Sig   acetaminophen  (TYLENOL ) 325 MG tablet Take 2 tablets (650 mg total) by mouth every 6 (six) hours as needed.   amitriptyline  (ELAVIL ) 25 MG tablet Take 1 tablet (25 mg) by mouth at bedtime. Stop rozerem .   amLODipine  (NORVASC ) 2.5 MG tablet Take 1 tablet (2.5 mg total) by mouth daily.   Aspirin-Acetaminophen -Caffeine (EXCEDRIN PO)  Take by mouth.   buPROPion  (WELLBUTRIN  XL) 300 MG 24 hr tablet Take 1 tablet (300 mg total) by mouth daily.   cefadroxil (DURICEF) 500 MG capsule Take 1 capsule (500 mg total) by mouth 2 (two) times daily for 5 days.   ciclopirox  (PENLAC ) 8 % solution Apply topically at bedtime. Apply over nail and surrounding skin. Apply daily over previous coat. After seven (7) days, may remove with alcohol and continue cycle.   COVID-19 mRNA vaccine 2023-2024 (COMIRNATY ) syringe Inject into the muscle.   desvenlafaxine  (PRISTIQ ) 50 MG 24 hr tablet Take 1 tablet by mouth daily.   diphenhydrAMINE-APAP, sleep, (TYLENOL  PM EXTRA STRENGTH PO) Take by mouth as needed.   ibuprofen (ADVIL) 600 MG tablet Take 1 tablet (600 mg total) by mouth every 6 (six) hours as needed.   losartan -hydrochlorothiazide  (HYZAAR ) 100-25 MG tablet Take 1 tablet by mouth daily.   Loteprednol -Tobramycin  (ZYLET ) 0.5-0.3 % SUSP Instill 1 drop into left eye three times a day   meloxicam  (MOBIC ) 15 MG tablet Take 1 tablet (15 mg total) by mouth daily.   Multiple Vitamin (MULTI-VITAMIN DAILY PO) Take by mouth.   Omega 3 1200 MG CAPS Take by mouth.   oxyCODONE  (ROXICODONE ) 5 MG immediate release tablet Take 1 tablet (5 mg total) by mouth every 4 (four) hours as needed  for severe pain (pain score 7-10).   rosuvastatin  (CRESTOR ) 10 MG tablet Take 1 tablet (10 mg total) by mouth daily.   VITAMIN D  PO Take 5,000 Units by mouth daily.   [DISCONTINUED] tirzepatide  (MOUNJARO ) 5 MG/0.5ML Pen Inject 5 mg into the skin once a week.   No facility-administered medications prior to visit.    Review of Systems  HENT:  Negative for hearing loss.   Eyes:  Negative for blurred vision.  Respiratory:  Negative for shortness of breath.   Cardiovascular:  Negative for chest pain.  Gastrointestinal: Negative.   Genitourinary: Negative.   Musculoskeletal:  Negative for back pain.  Neurological:  Negative for headaches.  Psychiatric/Behavioral:  Negative for  depression.   All other systems reviewed and are negative.      Objective:     BP 100/60   Pulse 63   Temp 97.9 F (36.6 C) (Oral)   Ht 5' 8.5 (1.74 m)   Wt 227 lb 9.6 oz (103.2 kg)   SpO2 99%   BMI 34.10 kg/m    Physical Exam Vitals reviewed.  Constitutional:      Appearance: Normal appearance. He is well-groomed. He is obese.  HENT:     Right Ear: Tympanic membrane and ear canal normal.     Left Ear: Tympanic membrane and ear canal normal.     Mouth/Throat:     Mouth: Mucous membranes are moist.     Pharynx: No posterior oropharyngeal erythema.  Eyes:     Extraocular Movements: Extraocular movements intact.     Conjunctiva/sclera: Conjunctivae normal.  Neck:     Thyroid : No thyromegaly.  Cardiovascular:     Rate and Rhythm: Normal rate and regular rhythm.     Heart sounds: S1 normal and S2 normal. No murmur heard. Pulmonary:     Effort: Pulmonary effort is normal.     Breath sounds: Normal breath sounds and air entry. No rales.  Abdominal:     General: Abdomen is flat. Bowel sounds are normal.  Musculoskeletal:     Right lower leg: No edema.     Left lower leg: No edema.  Lymphadenopathy:     Cervical: No cervical adenopathy.  Neurological:     General: No focal deficit present.     Mental Status: He is alert and oriented to person, place, and time.     Gait: Gait is intact.  Psychiatric:        Mood and Affect: Mood and affect normal.     Results for orders placed or performed in visit on 11/16/23  POC HgB A1c  Result Value Ref Range   Hemoglobin A1C 5.3 4.0 - 5.6 %   HbA1c POC (<> result, manual entry)     HbA1c, POC (prediabetic range)     HbA1c, POC (controlled diabetic range)         Assessment & Plan:    Routine Health Maintenance and Physical Exam  Immunization History  Administered Date(s) Administered   Influenza,inj,Quad PF,6+ Mos 06/02/2018, 04/14/2020, 03/02/2021   Influenza-Unspecified 04/18/2019, 03/08/2023   PFIZER(Purple  Top)SARS-COV-2 Vaccination 09/07/2019, 10/03/2019, 06/15/2020, 12/21/2020   Pfizer(Comirnaty )Fall Seasonal Vaccine 12 years and older 06/11/2022   Tdap 07/16/2019, 11/15/2023   Zoster Recombinant(Shingrix) 07/12/2019, 10/24/2019    Health Maintenance  Topic Date Due   Pneumococcal Vaccine 77-70 Years old (1 of 2 - PCV) Never done   COVID-19 Vaccine (6 - 2024-25 season) 02/06/2023   Diabetic kidney evaluation - eGFR measurement  09/29/2023   Diabetic  kidney evaluation - Urine ACR  09/30/2023   HIV Screening  11/15/2024 (Originally 11/16/1982)   INFLUENZA VACCINE  01/06/2024   OPHTHALMOLOGY EXAM  04/05/2024   FOOT EXAM  05/12/2024   HEMOGLOBIN A1C  05/17/2024   Fecal DNA (Cologuard)  07/18/2024   DTaP/Tdap/Td (3 - Td or Tdap) 11/14/2033   Hepatitis C Screening  Completed   Zoster Vaccines- Shingrix  Completed   HPV VACCINES  Aged Out   Meningococcal B Vaccine  Aged Out    Discussed health benefits of physical activity, and encouraged him to engage in regular exercise appropriate for his age and condition.  Controlled type 2 diabetes mellitus with other specified complication, without long-term current use of insulin (HCC) -     POCT glycosylated hemoglobin (Hb A1C) -     Collection capillary blood specimen -     Lipid panel; Future -     Mounjaro ; Inject 7.5 mg into the skin once a week. Ok to increase dose by 2.5 mg weekly after weeks on this dose.  Dispense: 2 mL; Refill: 5 -     Microalbumin / creatinine urine ratio; Future -     Comprehensive metabolic panel with GFR; Future  Routine general medical examination at a health care facility  Normal physical exam findings. I counseled the patient on healthy eating and exercise. Handouts given. Reviewed health maintenance reviewed and he is UTD. Labs ordered for annual surveillance and cardiac risk asesssment.   Return in 6 months (on 05/17/2024).     Aida House, MD

## 2023-11-17 LAB — LIPID PANEL
Chol/HDL Ratio: 2.5 ratio (ref 0.0–5.0)
Cholesterol, Total: 94 mg/dL — ABNORMAL LOW (ref 100–199)
HDL: 37 mg/dL — ABNORMAL LOW (ref >39)
LDL Chol Calc (NIH): 32 mg/dL (ref 0–99)
Triglycerides: 142 mg/dL (ref 0–149)
VLDL Cholesterol Cal: 25 mg/dL (ref 5–40)

## 2023-11-17 LAB — COMPREHENSIVE METABOLIC PANEL WITH GFR
ALT: 72 IU/L — ABNORMAL HIGH (ref 0–44)
AST: 38 IU/L (ref 0–40)
Albumin: 4.9 g/dL (ref 3.8–4.9)
Alkaline Phosphatase: 63 IU/L (ref 44–121)
BUN/Creatinine Ratio: 21 — ABNORMAL HIGH (ref 9–20)
BUN: 22 mg/dL (ref 6–24)
Bilirubin Total: 0.5 mg/dL (ref 0.0–1.2)
CO2: 23 mmol/L (ref 20–29)
Calcium: 10.2 mg/dL (ref 8.7–10.2)
Chloride: 93 mmol/L — ABNORMAL LOW (ref 96–106)
Creatinine, Ser: 1.03 mg/dL (ref 0.76–1.27)
Globulin, Total: 2.7 g/dL (ref 1.5–4.5)
Glucose: 78 mg/dL (ref 70–99)
Potassium: 4.6 mmol/L (ref 3.5–5.2)
Sodium: 132 mmol/L — ABNORMAL LOW (ref 134–144)
Total Protein: 7.6 g/dL (ref 6.0–8.5)
eGFR: 85 mL/min/{1.73_m2} (ref >59)

## 2023-11-17 LAB — MICROALBUMIN / CREATININE URINE RATIO
Creatinine, Urine: 23.5 mg/dL
Microalb/Creat Ratio: <13 mg/g{creat} (ref 0–29)
Microalbumin, Urine: <3 ug/mL

## 2023-11-18 ENCOUNTER — Ambulatory Visit: Payer: Self-pay | Admitting: Family Medicine

## 2023-12-28 DIAGNOSIS — G4733 Obstructive sleep apnea (adult) (pediatric): Secondary | ICD-10-CM | POA: Diagnosis not present

## 2024-01-11 ENCOUNTER — Other Ambulatory Visit (HOSPITAL_COMMUNITY): Payer: Self-pay

## 2024-01-11 ENCOUNTER — Other Ambulatory Visit: Payer: Self-pay | Admitting: Family Medicine

## 2024-01-11 DIAGNOSIS — M545 Low back pain, unspecified: Secondary | ICD-10-CM

## 2024-01-11 MED ORDER — MELOXICAM 15 MG PO TABS
15.0000 mg | ORAL_TABLET | Freq: Every day | ORAL | 1 refills | Status: DC
  Filled 2024-01-11: qty 90, 90d supply, fill #0
  Filled 2024-04-14: qty 90, 90d supply, fill #1

## 2024-01-13 ENCOUNTER — Other Ambulatory Visit (HOSPITAL_COMMUNITY): Payer: Self-pay

## 2024-02-06 ENCOUNTER — Other Ambulatory Visit: Payer: Self-pay | Admitting: Family Medicine

## 2024-02-06 DIAGNOSIS — G4700 Insomnia, unspecified: Secondary | ICD-10-CM

## 2024-02-07 ENCOUNTER — Other Ambulatory Visit: Payer: Self-pay

## 2024-02-07 ENCOUNTER — Other Ambulatory Visit (HOSPITAL_COMMUNITY): Payer: Self-pay

## 2024-02-07 MED ORDER — AMITRIPTYLINE HCL 25 MG PO TABS
25.0000 mg | ORAL_TABLET | Freq: Every day | ORAL | 1 refills | Status: AC
  Filled 2024-02-07: qty 90, 90d supply, fill #0
  Filled 2024-05-08: qty 90, 90d supply, fill #1

## 2024-03-26 ENCOUNTER — Other Ambulatory Visit (HOSPITAL_COMMUNITY): Payer: Self-pay

## 2024-03-29 NOTE — Progress Notes (Unsigned)
   LILLETTE Ileana Collet, PhD, LAT, ATC acting as a scribe for Artist Lloyd, MD.  Adam Mcdonald is a 58 y.o. male who presents to [facility] today for R shoulder and R thumb pain. Pt was previously seen by Dr. Lloyd on 10/28/23 for R knee pain.  Today, pt c/o R shoulder pain x ***. Pt locates pain to ***  Radiates: Aggravates: Treatments tried:  Pt also c/o R thumb pain x ***. Pt locates pain to ***  Pertinent review of systems: ***  Relevant historical information: ***   Exam:  There were no vitals taken for this visit. General: Well Developed, well nourished, and in no acute distress.   MSK: ***    Lab and Radiology Results No results found for this or any previous visit (from the past 72 hours). No results found.     Assessment and Plan: 58 y.o. male with ***   PDMP not reviewed this encounter. No orders of the defined types were placed in this encounter.  No orders of the defined types were placed in this encounter.    Discussed warning signs or symptoms. Please see discharge instructions. Patient expresses understanding.   ***

## 2024-03-30 ENCOUNTER — Ambulatory Visit: Admitting: Family Medicine

## 2024-03-30 ENCOUNTER — Encounter: Payer: Self-pay | Admitting: Family Medicine

## 2024-03-30 ENCOUNTER — Other Ambulatory Visit: Payer: Self-pay

## 2024-03-30 VITALS — BP 128/82 | HR 69 | Ht 68.5 in | Wt 214.0 lb

## 2024-03-30 DIAGNOSIS — M79644 Pain in right finger(s): Secondary | ICD-10-CM

## 2024-03-30 DIAGNOSIS — M25561 Pain in right knee: Secondary | ICD-10-CM | POA: Diagnosis not present

## 2024-03-30 DIAGNOSIS — G8929 Other chronic pain: Secondary | ICD-10-CM

## 2024-03-30 DIAGNOSIS — M25511 Pain in right shoulder: Secondary | ICD-10-CM | POA: Diagnosis not present

## 2024-03-30 NOTE — Patient Instructions (Addendum)
 Thank you for coming in today.   You received an injection today. Seek immediate medical attention if the joint becomes red, extremely painful, or is oozing fluid.   Please work on the home exercises the athletic trainer went over with you:  View at [email or web address] code [id number]  Please use Voltaren gel (Generic Diclofenac Gel) up to 4x daily for pain as needed.  This is available over-the-counter as both the name brand Voltaren gel and the generic diclofenac gel.   Recommend using a Thumb Spica Brace.   Let us  know if you would like to start Physical Therapy.   See you back as needed.

## 2024-04-02 ENCOUNTER — Other Ambulatory Visit (HOSPITAL_COMMUNITY): Payer: Self-pay

## 2024-04-04 ENCOUNTER — Other Ambulatory Visit (HOSPITAL_COMMUNITY): Payer: Self-pay

## 2024-04-14 ENCOUNTER — Other Ambulatory Visit (HOSPITAL_COMMUNITY): Payer: Self-pay

## 2024-04-29 ENCOUNTER — Other Ambulatory Visit: Payer: Self-pay | Admitting: Family Medicine

## 2024-04-29 DIAGNOSIS — E119 Type 2 diabetes mellitus without complications: Secondary | ICD-10-CM

## 2024-04-29 DIAGNOSIS — E1169 Type 2 diabetes mellitus with other specified complication: Secondary | ICD-10-CM

## 2024-04-30 ENCOUNTER — Other Ambulatory Visit (HOSPITAL_COMMUNITY): Payer: Self-pay

## 2024-04-30 ENCOUNTER — Other Ambulatory Visit: Payer: Self-pay

## 2024-04-30 MED ORDER — MOUNJARO 7.5 MG/0.5ML ~~LOC~~ SOAJ
7.5000 mg | SUBCUTANEOUS | 5 refills | Status: AC
  Filled 2024-04-30: qty 2, 28d supply, fill #0
  Filled 2024-05-24: qty 2, 28d supply, fill #1
  Filled 2024-06-21: qty 2, 28d supply, fill #2

## 2024-05-08 ENCOUNTER — Other Ambulatory Visit: Payer: Self-pay

## 2024-05-08 ENCOUNTER — Ambulatory Visit: Admitting: Family Medicine

## 2024-05-08 ENCOUNTER — Other Ambulatory Visit (HOSPITAL_COMMUNITY): Payer: Self-pay

## 2024-05-08 ENCOUNTER — Encounter: Payer: Self-pay | Admitting: Family Medicine

## 2024-05-08 VITALS — BP 100/58 | HR 65 | Temp 96.7°F | Ht 68.5 in | Wt 210.1 lb

## 2024-05-08 DIAGNOSIS — F321 Major depressive disorder, single episode, moderate: Secondary | ICD-10-CM | POA: Diagnosis not present

## 2024-05-08 DIAGNOSIS — E119 Type 2 diabetes mellitus without complications: Secondary | ICD-10-CM

## 2024-05-08 DIAGNOSIS — G8929 Other chronic pain: Secondary | ICD-10-CM

## 2024-05-08 DIAGNOSIS — I10 Essential (primary) hypertension: Secondary | ICD-10-CM

## 2024-05-08 DIAGNOSIS — M545 Low back pain, unspecified: Secondary | ICD-10-CM | POA: Diagnosis not present

## 2024-05-08 DIAGNOSIS — E782 Mixed hyperlipidemia: Secondary | ICD-10-CM

## 2024-05-08 DIAGNOSIS — Z1211 Encounter for screening for malignant neoplasm of colon: Secondary | ICD-10-CM

## 2024-05-08 LAB — POCT GLYCOSYLATED HEMOGLOBIN (HGB A1C): Hemoglobin A1C: 5.2 % (ref 4.0–5.6)

## 2024-05-08 MED ORDER — LOSARTAN POTASSIUM-HCTZ 100-25 MG PO TABS
1.0000 | ORAL_TABLET | Freq: Every day | ORAL | 1 refills | Status: AC
  Filled 2024-05-08: qty 90, 90d supply, fill #0

## 2024-05-08 MED ORDER — ROSUVASTATIN CALCIUM 10 MG PO TABS
10.0000 mg | ORAL_TABLET | Freq: Every day | ORAL | 3 refills | Status: AC
  Filled 2024-05-08: qty 90, 90d supply, fill #0

## 2024-05-08 MED ORDER — AMLODIPINE BESYLATE 2.5 MG PO TABS
2.5000 mg | ORAL_TABLET | Freq: Every day | ORAL | 1 refills | Status: AC
  Filled 2024-05-08: qty 90, 90d supply, fill #0

## 2024-05-08 MED ORDER — BUPROPION HCL ER (XL) 300 MG PO TB24
300.0000 mg | ORAL_TABLET | Freq: Every day | ORAL | 1 refills | Status: AC
  Filled 2024-05-08: qty 90, 90d supply, fill #0

## 2024-05-08 MED ORDER — MELOXICAM 15 MG PO TABS
15.0000 mg | ORAL_TABLET | Freq: Every day | ORAL | 1 refills | Status: AC
  Filled 2024-05-08 – 2024-07-11 (×2): qty 90, 90d supply, fill #0

## 2024-05-08 MED ORDER — DESVENLAFAXINE SUCCINATE ER 50 MG PO TB24
50.0000 mg | ORAL_TABLET | Freq: Every day | ORAL | 1 refills | Status: AC
  Filled 2024-05-08: qty 90, 90d supply, fill #0

## 2024-05-08 NOTE — Progress Notes (Signed)
 Established Patient Office Visit  Subjective   Patient ID: Adam Mcdonald, male    DOB: 25-Sep-1965  Age: 58 y.o. MRN: [id number]  Chief Complaint  Patient presents with   Medical Management of Chronic Issues    HPI Discussed the use of AI scribe software for clinical note transcription with the patient, who gave verbal consent to proceed.  History of Present Illness   Adam Mcdonald is a 58 year old male with diabetes who presents for a midyear follow-up visit.  His A1c has improved from 5.3 to 5.2. He has been losing weight and now weighs 210 pounds. He has no issues with his current regimen, including Mounjaro  injections, and has no gastrointestinal side effects.  His depression symptoms have improved, with a current score of zero. He attributes this to weight loss and his medications, including Pristiq  50 mg and bupropion  300 mg.  He has persistent joint problems in his knees, fingers, and shoulder despite weight loss. He has had injections in these joints with good relief and uses double knee sleeves, which help significantly.  He has compulsive toenail picking with one missing toenail but no infection. He has neuropathy in his feet with numb areas but no burning or pain. He has no dizziness or lightheadedness.  He has tracked his weight since January 2023, from 248 pounds to 201 pounds on his home scale. His goal is to lose about 2 more pounds by the end of the year to reach a total loss of 50 pounds.     Flowsheet Row Office Visit from 11/16/2023 in [facility] [place]  PHQ-9 Total Score 2     Current Outpatient Medications  Medication Instructions   amitriptyline  (ELAVIL ) 25 MG tablet Take 1 tablet (25 mg) by mouth at bedtime. Stop rozerem .   amLODipine  (NORVASC ) 2.5 mg, Oral, Daily   Aspirin-Acetaminophen -Caffeine (EXCEDRIN PO) Take by mouth.   buPROPion  (WELLBUTRIN  XL) 300 mg, Oral, Daily   desvenlafaxine  (PRISTIQ ) 50 MG 24 hr tablet Take 1 tablet  by mouth daily.   diphenhydrAMINE-APAP, sleep, (TYLENOL  PM EXTRA STRENGTH PO) As needed   losartan -hydrochlorothiazide  (HYZAAR ) 100-25 MG tablet 1 tablet, Oral, Daily   Loteprednol -Tobramycin  (ZYLET ) 0.5-0.3 % SUSP Instill 1 drop into left eye three times a day   meloxicam  (MOBIC ) 15 mg, Oral, Daily   Mounjaro  7.5 mg, Subcutaneous, Weekly, Ok to increase dose by 2.5 mg weekly after weeks on this dose.   Multiple Vitamin (MULTI-VITAMIN DAILY PO) Take by mouth.   Omega 3 1200 MG CAPS Take by mouth.   rosuvastatin  (CRESTOR ) 10 mg, Oral, Daily   VITAMIN D  PO 5,000 Units, Daily    Patient Active Problem List   Diagnosis Date Noted   Prepatellar bursitis of left knee 05/19/2023   Depression, major, single episode, moderate (HCC) 09/30/2022   Diabetes mellitus type II, controlled (HCC) 12/15/2020   Obstructive sleep apnea 11/24/2020   Hypertension 08/31/2019   Low back pain 06/23/2018   Vitamin D  deficiency 06/02/2018     Review of Systems  All other systems reviewed and are negative.     Objective:     BP (!) 100/58   Pulse 65   Temp (!) 96.7 F (35.9 C) (Axillary)   Ht 5' 8.5 (1.74 m)   Wt 210 lb 1.6 oz (95.3 kg)   SpO2 98%   BMI 31.48 kg/m    Physical Exam Vitals reviewed.  Constitutional:      Appearance: Normal appearance. He is well-groomed. He  is obese.  Eyes:     Conjunctiva/sclera: Conjunctivae normal.  Neck:     Thyroid : No thyromegaly.  Cardiovascular:     Rate and Rhythm: Normal rate and regular rhythm.     Heart sounds: S1 normal and S2 normal. No murmur heard. Pulmonary:     Effort: Pulmonary effort is normal.     Breath sounds: Normal breath sounds and air entry. No rales.  Abdominal:     General: Bowel sounds are normal.  Musculoskeletal:     Right lower leg: No edema.     Left lower leg: No edema.  Neurological:     General: No focal deficit present.     Mental Status: He is alert and oriented to person, place, and time.     Gait: Gait is  intact.  Psychiatric:        Mood and Affect: Mood and affect normal.      Results for orders placed or performed in visit on 05/08/24  POC HgB A1c  Result Value Ref Range   Hemoglobin A1C 5.2 4.0 - 5.6 %   HbA1c POC (<> result, manual entry)     HbA1c, POC (prediabetic range)     HbA1c, POC (controlled diabetic range)        The ASCVD Risk score (Arnett DK, et al., 2019) failed to calculate for the following reasons:   The valid total cholesterol range is 130 to 320 mg/dL    Assessment & Plan:  Diabetes mellitus type II, controlled (HCC) -     POCT glycosylated hemoglobin (Hb A1C) -     Collection capillary blood specimen  Colon cancer screening -     Cologuard  Primary hypertension -     amLODIPine  Besylate; Take 1 tablet (2.5 mg total) by mouth daily.  Dispense: 90 tablet; Refill: 1 -     Losartan  Potassium-HCTZ; Take 1 tablet by mouth daily.  Dispense: 90 tablet; Refill: 1  Depression, major, single episode, moderate (HCC) -     buPROPion  HCl ER (XL); Take 1 tablet (300 mg total) by mouth daily.  Dispense: 90 tablet; Refill: 1 -     Desvenlafaxine  Succinate ER; Take 1 tablet by mouth daily.  Dispense: 90 tablet; Refill: 1  Chronic low back pain without sciatica, unspecified back pain laterality -     Meloxicam ; Take 1 tablet (15 mg total) by mouth daily.  Dispense: 90 tablet; Refill: 1  Mixed hyperlipidemia -     Rosuvastatin  Calcium ; Take 1 tablet (10 mg total) by mouth daily.  Dispense: 90 tablet; Refill: 3   Assessment and Plan    Type 2 diabetes mellitus with diabetic neuropathy Type 2 diabetes mellitus is well-controlled with an A1c of 5.2, improved from 5.3. Weight loss of 35 pounds has been achieved, contributing to better glycemic control. Diabetic neuropathy is present with numbness in the feet, indicating nerve damage. No burning or tingling sensations reported, suggesting progression to numbness. A1c control should prevent further progression of  neuropathy. - Continue current diabetes management regimen. - Schedule yearly eye exam with [facility]. - Ordered Cologuard test for February 2026. - Monitor feet daily for cuts, scrapes, or wounds. - Maintain A1c control to prevent further neuropathy progression.  Primary hypertension Blood pressure is currently 100/58, on the lower end of normal. Weight loss has contributed to improved blood pressure control. Potential need to adjust antihypertensive medications if symptoms of hypotension occur. - Monitor for symptoms of hypotension, including dizziness upon standing and  rapid heart rate. - If symptoms of hypotension occur, consider stopping amlodipine  and monitor blood pressure. - Continue current antihypertensive regimen unless symptoms of hypotension develop.  Major depressive disorder, single episode, moderate Depression is well-managed with a score of zero. Weight loss and improved diabetes control have positively impacted mood. Current medication regimen includes Pristiq  50 mg and bupropion  300 mg. - Continue Pristiq  50 mg and bupropion  300 mg.  General Health Maintenance Routine health maintenance is up to date with recent dental visit and previous eye exam. Cologuard test is due in February 2026. - Schedule yearly eye exam with [facility]. - Ordered Cologuard test for February 2026.        Return in about 6 months (around 11/06/2024) for annual physical exam.    Heron CHRISTELLA Sharper, MD

## 2024-05-09 ENCOUNTER — Other Ambulatory Visit (HOSPITAL_COMMUNITY): Payer: Self-pay

## 2024-05-09 DIAGNOSIS — E119 Type 2 diabetes mellitus without complications: Secondary | ICD-10-CM | POA: Diagnosis not present

## 2024-05-10 LAB — OPHTHALMOLOGY REPORT-SCANNED

## 2024-05-17 LAB — OPHTHALMOLOGY REPORT-SCANNED

## 2024-05-24 ENCOUNTER — Other Ambulatory Visit (HOSPITAL_COMMUNITY): Payer: Self-pay

## 2024-06-21 ENCOUNTER — Other Ambulatory Visit: Payer: Self-pay

## 2024-07-10 LAB — COLOGUARD: COLOGUARD: NEGATIVE

## 2024-07-11 ENCOUNTER — Ambulatory Visit: Payer: Self-pay | Admitting: Family Medicine

## 2024-07-11 ENCOUNTER — Other Ambulatory Visit (HOSPITAL_COMMUNITY): Payer: Self-pay

## 2024-07-12 ENCOUNTER — Other Ambulatory Visit (HOSPITAL_COMMUNITY): Payer: Self-pay

## 2024-07-20 ENCOUNTER — Ambulatory Visit: Admitting: Family Medicine
